# Patient Record
Sex: Male | Born: 1940 | Race: White | Hispanic: No | Marital: Married | Smoking: Never smoker
Health system: Southern US, Community
[De-identification: ages and names within clinical notes are randomized; demographics above are authoritative.]

## PROBLEM LIST (undated history)

## (undated) ENCOUNTER — Emergency Department (HOSPITAL_COMMUNITY): Admission: EM | Discharge status: Absent without leave | Payer: Self-pay

## (undated) DIAGNOSIS — E785 Hyperlipidemia, unspecified: Secondary | ICD-10-CM

## (undated) DIAGNOSIS — I10 Essential (primary) hypertension: Secondary | ICD-10-CM

## (undated) DIAGNOSIS — Z86010 Personal history of colonic polyps: Secondary | ICD-10-CM

## (undated) DIAGNOSIS — R972 Elevated prostate specific antigen [PSA]: Secondary | ICD-10-CM

## (undated) DIAGNOSIS — M1990 Unspecified osteoarthritis, unspecified site: Secondary | ICD-10-CM

## (undated) DIAGNOSIS — Z960 Presence of urogenital implants: Secondary | ICD-10-CM

## (undated) DIAGNOSIS — N400 Enlarged prostate without lower urinary tract symptoms: Secondary | ICD-10-CM

## (undated) DIAGNOSIS — I739 Peripheral vascular disease, unspecified: Secondary | ICD-10-CM

## (undated) DIAGNOSIS — Z860101 Personal history of adenomatous and serrated colon polyps: Secondary | ICD-10-CM

## (undated) DIAGNOSIS — Z85820 Personal history of malignant melanoma of skin: Secondary | ICD-10-CM

## (undated) DIAGNOSIS — R339 Retention of urine, unspecified: Secondary | ICD-10-CM

## (undated) DIAGNOSIS — Z978 Presence of other specified devices: Secondary | ICD-10-CM

## (undated) DIAGNOSIS — C801 Malignant (primary) neoplasm, unspecified: Secondary | ICD-10-CM

## (undated) HISTORY — PX: TONSILLECTOMY: SUR1361

---

## 1982-08-17 HISTORY — PX: ANAL FISSURE REPAIR: SHX2312

## 1999-06-19 ENCOUNTER — Other Ambulatory Visit: Admission: RE | Admit: 1999-06-19 | Discharge: 1999-06-19 | Payer: Self-pay | Admitting: Urology

## 2000-10-11 ENCOUNTER — Other Ambulatory Visit: Admission: RE | Admit: 2000-10-11 | Discharge: 2000-10-11 | Payer: Self-pay | Admitting: Urology

## 2000-10-11 ENCOUNTER — Encounter (INDEPENDENT_AMBULATORY_CARE_PROVIDER_SITE_OTHER): Payer: Self-pay

## 2004-09-10 ENCOUNTER — Ambulatory Visit (HOSPITAL_COMMUNITY): Admission: RE | Admit: 2004-09-10 | Discharge: 2004-09-10 | Payer: Self-pay | Admitting: *Deleted

## 2004-09-10 ENCOUNTER — Ambulatory Visit (HOSPITAL_BASED_OUTPATIENT_CLINIC_OR_DEPARTMENT_OTHER): Admission: RE | Admit: 2004-09-10 | Discharge: 2004-09-10 | Payer: Self-pay | Admitting: *Deleted

## 2004-09-10 ENCOUNTER — Encounter (INDEPENDENT_AMBULATORY_CARE_PROVIDER_SITE_OTHER): Payer: Self-pay | Admitting: Specialist

## 2004-09-10 HISTORY — PX: OTHER SURGICAL HISTORY: SHX169

## 2004-09-18 ENCOUNTER — Ambulatory Visit: Payer: Self-pay | Admitting: Oncology

## 2010-05-20 ENCOUNTER — Ambulatory Visit (HOSPITAL_BASED_OUTPATIENT_CLINIC_OR_DEPARTMENT_OTHER): Admission: RE | Admit: 2010-05-20 | Discharge: 2010-05-20 | Payer: Self-pay | Admitting: Orthopedic Surgery

## 2010-05-20 HISTORY — PX: SHOULDER ARTHROSCOPY W/ SUBACROMIAL DECOMPRESSION AND DISTAL CLAVICLE EXCISION: SHX2401

## 2010-10-30 LAB — BASIC METABOLIC PANEL WITH GFR
BUN: 15 mg/dL (ref 6–23)
CO2: 27 meq/L (ref 19–32)
Calcium: 9.4 mg/dL (ref 8.4–10.5)
Chloride: 105 meq/L (ref 96–112)
Creatinine, Ser: 1.23 mg/dL (ref 0.4–1.5)
GFR calc non Af Amer: 59 mL/min — ABNORMAL LOW
Glucose, Bld: 115 mg/dL — ABNORMAL HIGH (ref 70–99)
Potassium: 4 meq/L (ref 3.5–5.1)
Sodium: 141 meq/L (ref 135–145)

## 2011-01-02 NOTE — Op Note (Signed)
Zachary Wang, Zachary Wang               ACCOUNT NO.:  [id number]   MEDICAL RECORD NO.:  [id number]          PATIENT TYPE:  AMB   LOCATION:  DSC                          FACILITY:  [facility]   PHYSICIAN:  Vikki Ports, MDDATE OF BIRTH:  1941/05/21   DATE OF PROCEDURE:  09/10/2004  DATE OF DISCHARGE:                                 OPERATIVE REPORT   PREOPERATIVE DIAGNOSIS:  Malignant melanoma of the left forearm.   POSTOPERATIVE DIAGNOSIS:  Malignant melanoma of the left forearm.   PROCEDURE:  Wide excision of malignant melanoma of the left forearm and left  axillary sentinel lymph node biopsy.   ANESTHESIA:  General.   SURGEON:  Danna Hefty, M.D.   DESCRIPTION OF PROCEDURE:  The patient was taken to the operating room and  placed in the supine position.  After adequate anesthesia was induced using  laryngeal mask, the left axilla and arm were prepped and draped in the  normal sterile fashion.  Using 1 mL Olympus __________ blue dye was injected  dermally around the lesion in the left forearm.  The NeoProbe was then used  to localize an area of  high activity in the left axilla, incision was made  over this area, dissected down through subcutaneous fat until a blue hot  radioactive lymph node was identified, excised in its entirety.  Ex-vivo  counts were in the 500 range, background counts were 15.  Adequate  hemostasis was ensured and the skin was closed with subcuticular 3-0  Monocryl.   I then turned my attention to the left forearm where an elliptical incision  was made around the previous excision site, dissected down to subcutaneous  fat.  The lateral margin was marked and it was sent for pathologic  evaluation.  Adequate hemostasis was ensured and the wound was closed with  interrupted vertical mattress 3-0 nylon suture.  Sterile dressings were  applied, patient tolerated the procedure well and went to PACU in good  condition.      KRH/MEDQ  D:  09/10/2004  T:   09/10/2004  Job:  [id number]

## 2013-03-29 ENCOUNTER — Other Ambulatory Visit: Payer: Self-pay | Admitting: Gastroenterology

## 2013-05-02 ENCOUNTER — Ambulatory Visit (HOSPITAL_COMMUNITY)
Admission: RE | Admit: 2013-05-02 | Discharge: 2013-05-02 | Disposition: A | Discharge status: Home / Self Care | Payer: Medicare Other | Source: Ambulatory Visit | Attending: Gastroenterology | Admitting: Gastroenterology

## 2013-05-02 ENCOUNTER — Encounter (HOSPITAL_COMMUNITY): Payer: Self-pay | Admitting: *Deleted

## 2013-05-02 ENCOUNTER — Encounter (HOSPITAL_COMMUNITY)
Admission: RE | Disposition: A | Discharge status: Home / Self Care | Payer: Self-pay | Source: Ambulatory Visit | Attending: Gastroenterology

## 2013-05-02 DIAGNOSIS — D126 Benign neoplasm of colon, unspecified: Secondary | ICD-10-CM | POA: Insufficient documentation

## 2013-05-02 DIAGNOSIS — M1990 Unspecified osteoarthritis, unspecified site: Secondary | ICD-10-CM | POA: Insufficient documentation

## 2013-05-02 DIAGNOSIS — R972 Elevated prostate specific antigen [PSA]: Secondary | ICD-10-CM | POA: Insufficient documentation

## 2013-05-02 DIAGNOSIS — I10 Essential (primary) hypertension: Secondary | ICD-10-CM | POA: Insufficient documentation

## 2013-05-02 DIAGNOSIS — E7800 Pure hypercholesterolemia, unspecified: Secondary | ICD-10-CM | POA: Insufficient documentation

## 2013-05-02 HISTORY — DX: Peripheral vascular disease, unspecified: I73.9

## 2013-05-02 HISTORY — DX: Unspecified osteoarthritis, unspecified site: M19.90

## 2013-05-02 HISTORY — PX: COLONOSCOPY: SHX5424

## 2013-05-02 HISTORY — DX: Essential (primary) hypertension: I10

## 2013-05-02 SURGERY — COLONOSCOPY
Anesthesia: Moderate Sedation

## 2013-05-02 MED ORDER — MIDAZOLAM HCL 10 MG/2ML IJ SOLN
INTRAMUSCULAR | Status: AC
  Filled 2013-05-02: qty 2

## 2013-05-02 MED ORDER — FENTANYL CITRATE 0.05 MG/ML IJ SOLN
INTRAMUSCULAR | Status: DC | PRN
  Administered 2013-05-02: 50 ug via INTRAVENOUS
  Administered 2013-05-02: 25 ug via INTRAVENOUS

## 2013-05-02 MED ORDER — FENTANYL CITRATE 0.05 MG/ML IJ SOLN
INTRAMUSCULAR | Status: AC
  Filled 2013-05-02: qty 2

## 2013-05-02 MED ORDER — SODIUM CHLORIDE 0.9 % IV SOLN
INTRAVENOUS | Status: DC
  Administered 2013-05-02: 500 mL via INTRAVENOUS

## 2013-05-02 MED ORDER — MIDAZOLAM HCL 5 MG/5ML IJ SOLN
INTRAMUSCULAR | Status: DC | PRN
  Administered 2013-05-02: 2 mg via INTRAVENOUS
  Administered 2013-05-02 (×2): 2.5 mg via INTRAVENOUS

## 2013-05-02 NOTE — Op Note (Signed)
Problem: Hemoccult-positive stool with normal hemoglobin  Endoscopist: Danise Edge  Premedication: Fentanyl 75 mcg. Versed 7 mg.  Procedure: Diagnostic colonoscopy The patient was placed in the left lateral decubitus position. Anal inspection and digital rectal exam were normal. The Pentax pediatric colonoscope was introduced into the rectum and advanced to the cecum. A normal-appearing appendiceal orifice and ileocecal valve were identified. Inspection of the terminal ileum was normal. Colonic preparation for the exam today was good.  Rectum. Normal. Retroflex view of the distal rectum normal.  Sigmoid colon and descending colon. Normal.   Splenic flexure. Normal.  Transverse colon. Normal.  Hepatic flexure. Normal.  Ascending colon. Three sessile polyps ranging in size from 5 mm-10 mm were removed with the cold snare and submitted for pathological interpretation.  Cecum and ileocecal valve. Normal.  Terminal ileum. Normal.  Assessment:Three polyps were removed from the ascending colon and submitted for pathological interpretation. Otherwise normal diagnostic proctocolonoscopy to the cecum.  Recommendations: If colon polyps returned neoplastic pathologically, the patient should undergo a surveillance colonoscopy in 5 years. If the colon polyps returned non-neoplastic pathologically, the patient should undergo a screening colonoscopy in 10 years.

## 2013-05-02 NOTE — H&P (Signed)
  Problem: Hemoccult-positive stool associated with a normal hemoglobin  History: The patient is a 72 year old male born 19-Dec-1940. The patient underwent a normal screening colonoscopy in March 2008. He chronically takes aspirin 81 daily. He submitted stool for Hemoccult testing as part of his health maintenance physical exam. His stool returned Hemoccult-positive. His hemoglobin was normal. His sister was diagnosed with rectal cancer.  The patient is scheduled to undergo a diagnostic colonoscopy to evaluate Hemoccult-positive stool.  Medication allergies: None  Past medical history: Hypertension. Hypercholesterolemia. Osteoarthritis. Elevated PSA test. Anal fissure surgery. Left shoulder surgery.  Exam: The patient is alert and lying comfortably on the endoscopy stretcher. Abdomen is soft and nontender to palpation. Cardiac exam reveals a regular rhythm. Lungs are clear to auscultation.  Plan: Proceed with diagnostic colonoscopy to evaluate Hemoccult-positive stool.

## 2013-05-03 ENCOUNTER — Encounter (HOSPITAL_COMMUNITY): Payer: Self-pay | Admitting: Gastroenterology

## 2013-06-22 ENCOUNTER — Other Ambulatory Visit: Payer: Self-pay

## 2015-08-27 NOTE — ED Notes (Signed)
Pt had CT abd/pelvis today. Results showed distended thick walled, sac related bladder containing stones. Results are being faxed to ED.

## 2015-09-19 ENCOUNTER — Other Ambulatory Visit: Payer: Self-pay | Admitting: Urology

## 2015-09-20 ENCOUNTER — Encounter (HOSPITAL_BASED_OUTPATIENT_CLINIC_OR_DEPARTMENT_OTHER): Payer: Self-pay | Admitting: *Deleted

## 2015-09-20 NOTE — Progress Notes (Signed)
NPO AFTER MN.  ARRIVE AT 0600.  NEEDS ISTAT AND EKG.  WILL TAKE NORVASC AND COZAAR AM DOS W/ SIPS OF WATER. REVIEWED [facility] GUIDELINES, WILL BRING MEDS.

## 2015-09-29 NOTE — Anesthesia Preprocedure Evaluation (Addendum)
Anesthesia Evaluation  Patient identified by MRN, date of birth, ID band Patient awake    Reviewed: Allergy & Precautions, NPO status , Patient's Chart, lab work & pertinent test results  Airway Mallampati: III  TM Distance: >3 FB Neck ROM: Full    Dental  (+) Teeth Intact, Dental Advisory Given   Pulmonary neg pulmonary ROS,    breath sounds clear to auscultation       Cardiovascular hypertension, Pt. on medications + Peripheral Vascular Disease   Rhythm:Regular Rate:Normal     Neuro/Psych negative neurological ROS  negative psych ROS   GI/Hepatic negative GI ROS, Neg liver ROS,   Endo/Other  negative endocrine ROS  Renal/GU negative Renal ROS  negative genitourinary   Musculoskeletal  (+) Arthritis ,   Abdominal   Peds negative pediatric ROS (+)  Hematology negative hematology ROS (+)   Anesthesia Other Findings - HLD - BPH - Malignant Melanoma  Reproductive/Obstetrics negative OB ROS                            Lab Results  Component Value Date   HGB 13.1 05/20/2010   Lab Results  Component Value Date   CREATININE 1.23 05/15/2010   BUN 15 05/15/2010   NA 141 05/15/2010   K 4.0 05/15/2010   CL 105 05/15/2010   CO2 27 05/15/2010   No results found for: INR, PROTIME   Anesthesia Physical Anesthesia Plan  ASA: II  Anesthesia Plan: General   Post-op Pain Management:    Induction: Intravenous  Airway Management Planned: LMA  Additional Equipment:   Intra-op Plan:   Post-operative Plan: Extubation in OR  Informed Consent: I have reviewed the patients History and Physical, chart, labs and discussed the procedure including the risks, benefits and alternatives for the proposed anesthesia with the patient or authorized representative who has indicated his/her understanding and acceptance.   Dental advisory given  Plan Discussed with: CRNA  Anesthesia Plan Comments:  (Need EKG)        Anesthesia Quick Evaluation

## 2015-09-30 ENCOUNTER — Ambulatory Visit (HOSPITAL_BASED_OUTPATIENT_CLINIC_OR_DEPARTMENT_OTHER)
Admission: RE | Admit: 2015-09-30 | Discharge: 2015-10-01 | Disposition: A | Discharge status: Home / Self Care | Payer: Medicare Other | Source: Ambulatory Visit | Attending: Urology | Admitting: Urology

## 2015-09-30 ENCOUNTER — Encounter (HOSPITAL_BASED_OUTPATIENT_CLINIC_OR_DEPARTMENT_OTHER): Payer: Self-pay

## 2015-09-30 ENCOUNTER — Ambulatory Visit (HOSPITAL_BASED_OUTPATIENT_CLINIC_OR_DEPARTMENT_OTHER): Payer: Medicare Other | Admitting: Anesthesiology

## 2015-09-30 ENCOUNTER — Encounter (HOSPITAL_COMMUNITY)
Admission: RE | Disposition: A | Discharge status: Home / Self Care | Payer: Self-pay | Source: Ambulatory Visit | Attending: Urology

## 2015-09-30 ENCOUNTER — Other Ambulatory Visit: Payer: Self-pay

## 2015-09-30 DIAGNOSIS — N528 Other male erectile dysfunction: Secondary | ICD-10-CM | POA: Diagnosis not present

## 2015-09-30 DIAGNOSIS — R972 Elevated prostate specific antigen [PSA]: Secondary | ICD-10-CM | POA: Diagnosis not present

## 2015-09-30 DIAGNOSIS — I739 Peripheral vascular disease, unspecified: Secondary | ICD-10-CM | POA: Diagnosis not present

## 2015-09-30 DIAGNOSIS — M1990 Unspecified osteoarthritis, unspecified site: Secondary | ICD-10-CM | POA: Insufficient documentation

## 2015-09-30 DIAGNOSIS — N401 Enlarged prostate with lower urinary tract symptoms: Secondary | ICD-10-CM | POA: Diagnosis present

## 2015-09-30 DIAGNOSIS — Z85820 Personal history of malignant melanoma of skin: Secondary | ICD-10-CM | POA: Insufficient documentation

## 2015-09-30 DIAGNOSIS — N138 Other obstructive and reflux uropathy: Secondary | ICD-10-CM | POA: Insufficient documentation

## 2015-09-30 DIAGNOSIS — E291 Testicular hypofunction: Secondary | ICD-10-CM | POA: Diagnosis not present

## 2015-09-30 DIAGNOSIS — Z79899 Other long term (current) drug therapy: Secondary | ICD-10-CM | POA: Diagnosis not present

## 2015-09-30 DIAGNOSIS — E7800 Pure hypercholesterolemia, unspecified: Secondary | ICD-10-CM | POA: Diagnosis not present

## 2015-09-30 DIAGNOSIS — I10 Essential (primary) hypertension: Secondary | ICD-10-CM | POA: Diagnosis not present

## 2015-09-30 DIAGNOSIS — R338 Other retention of urine: Secondary | ICD-10-CM | POA: Diagnosis not present

## 2015-09-30 DIAGNOSIS — I452 Bifascicular block: Secondary | ICD-10-CM | POA: Insufficient documentation

## 2015-09-30 HISTORY — DX: Personal history of colonic polyps: Z86.010

## 2015-09-30 HISTORY — DX: Personal history of adenomatous and serrated colon polyps: Z86.0101

## 2015-09-30 HISTORY — DX: Benign prostatic hyperplasia without lower urinary tract symptoms: N40.0

## 2015-09-30 HISTORY — DX: Elevated prostate specific antigen (PSA): R97.20

## 2015-09-30 HISTORY — DX: Retention of urine, unspecified: R33.9

## 2015-09-30 HISTORY — DX: Personal history of malignant melanoma of skin: Z85.820

## 2015-09-30 HISTORY — DX: Presence of other specified devices: Z97.8

## 2015-09-30 HISTORY — PX: INSERTION OF SUPRAPUBIC CATHETER: SHX5870

## 2015-09-30 HISTORY — DX: Presence of urogenital implants: Z96.0

## 2015-09-30 HISTORY — DX: Hyperlipidemia, unspecified: E78.5

## 2015-09-30 HISTORY — PX: TRANSURETHRAL RESECTION OF PROSTATE: SHX73

## 2015-09-30 LAB — POCT I-STAT 4, (NA,K, GLUC, HGB,HCT)
Glucose, Bld: 107 mg/dL — ABNORMAL HIGH (ref 65–99)
HCT: 38 % — ABNORMAL LOW (ref 39.0–52.0)
Hemoglobin: 12.9 g/dL — ABNORMAL LOW (ref 13.0–17.0)
Potassium: 4 mmol/L (ref 3.5–5.1)
Sodium: 140 mmol/L (ref 135–145)

## 2015-09-30 SURGERY — TRANSURETHRAL RESECTION OF THE PROSTATE WITH GYRUS INSTRUMENTS
Anesthesia: General

## 2015-09-30 MED ORDER — EPHEDRINE SULFATE 50 MG/ML IJ SOLN
INTRAMUSCULAR | Status: DC | PRN
  Administered 2015-09-30 (×2): 10 mg via INTRAVENOUS

## 2015-09-30 MED ORDER — CIPROFLOXACIN IN D5W 400 MG/200ML IV SOLN
400.0000 mg | Freq: Two times a day (BID) | INTRAVENOUS | Status: DC
  Administered 2015-09-30 – 2015-10-01 (×2): 400 mg via INTRAVENOUS
  Filled 2015-09-30 (×2): qty 200

## 2015-09-30 MED ORDER — MIDAZOLAM HCL 2 MG/2ML IJ SOLN
INTRAMUSCULAR | Status: AC
  Filled 2015-09-30: qty 2

## 2015-09-30 MED ORDER — EPHEDRINE SULFATE 50 MG/ML IJ SOLN
INTRAMUSCULAR | Status: AC
  Filled 2015-09-30: qty 1

## 2015-09-30 MED ORDER — FENTANYL CITRATE (PF) 100 MCG/2ML IJ SOLN
INTRAMUSCULAR | Status: AC
  Filled 2015-09-30: qty 2

## 2015-09-30 MED ORDER — PHENAZOPYRIDINE HCL 200 MG PO TABS: 200.0000 mg | ORAL_TABLET | Freq: Three times a day (TID) | ORAL | Status: DC | PRN

## 2015-09-30 MED ORDER — HYDROMORPHONE HCL 1 MG/ML IJ SOLN
0.5000 mg | INTRAMUSCULAR | Status: DC | PRN
  Filled 2015-09-30: qty 1

## 2015-09-30 MED ORDER — DEXAMETHASONE SODIUM PHOSPHATE 4 MG/ML IJ SOLN
INTRAMUSCULAR | Status: DC | PRN
  Administered 2015-09-30: 10 mg via INTRAVENOUS

## 2015-09-30 MED ORDER — CIPROFLOXACIN IN D5W 400 MG/200ML IV SOLN
400.0000 mg | INTRAVENOUS | Status: AC
  Administered 2015-09-30: 400 mg via INTRAVENOUS
  Filled 2015-09-30: qty 200

## 2015-09-30 MED ORDER — BELLADONNA ALKALOIDS-OPIUM 16.2-60 MG RE SUPP
RECTAL | Status: AC
Start: 2015-09-30 — End: 2015-09-30
  Filled 2015-09-30: qty 1

## 2015-09-30 MED ORDER — FENTANYL CITRATE (PF) 100 MCG/2ML IJ SOLN
25.0000 ug | INTRAMUSCULAR | Status: DC | PRN
  Administered 2015-09-30: 25 ug via INTRAVENOUS
  Administered 2015-09-30 (×3): 50 ug via INTRAVENOUS
  Filled 2015-09-30: qty 1

## 2015-09-30 MED ORDER — FENTANYL CITRATE (PF) 100 MCG/2ML IJ SOLN
INTRAMUSCULAR | Status: DC | PRN
  Administered 2015-09-30: 25 ug via INTRAVENOUS
  Administered 2015-09-30: 50 ug via INTRAVENOUS
  Administered 2015-09-30 (×2): 25 ug via INTRAVENOUS
  Administered 2015-09-30: 50 ug via INTRAVENOUS
  Administered 2015-09-30: 25 ug via INTRAVENOUS

## 2015-09-30 MED ORDER — ONDANSETRON HCL 4 MG/2ML IJ SOLN
4.0000 mg | INTRAMUSCULAR | Status: DC | PRN
  Administered 2015-10-01: 4 mg via INTRAVENOUS
  Filled 2015-09-30 (×2): qty 2

## 2015-09-30 MED ORDER — CIPROFLOXACIN HCL 500 MG PO TABS: 500.0000 mg | ORAL_TABLET | Freq: Two times a day (BID) | ORAL | Status: DC

## 2015-09-30 MED ORDER — OXYCODONE HCL 5 MG PO TABS
5.0000 mg | ORAL_TABLET | ORAL | Status: DC | PRN
  Administered 2015-09-30 – 2015-10-01 (×6): 5 mg via ORAL
  Filled 2015-09-30 (×6): qty 1

## 2015-09-30 MED ORDER — BELLADONNA ALKALOIDS-OPIUM 16.2-60 MG RE SUPP
RECTAL | Status: AC
  Filled 2015-09-30: qty 1

## 2015-09-30 MED ORDER — PROPOFOL 10 MG/ML IV BOLUS
INTRAVENOUS | Status: AC
  Filled 2015-09-30: qty 20

## 2015-09-30 MED ORDER — BELLADONNA ALKALOIDS-OPIUM 16.2-60 MG RE SUPP
1.0000 | Freq: Four times a day (QID) | RECTAL | Status: DC | PRN
  Administered 2015-09-30 (×2): 1 via RECTAL
  Filled 2015-09-30 (×2): qty 1

## 2015-09-30 MED ORDER — HYDROCODONE-ACETAMINOPHEN 10-325 MG PO TABS: 1.0000 | ORAL_TABLET | ORAL | Status: DC | PRN

## 2015-09-30 MED ORDER — BUPIVACAINE HCL (PF) 0.25 % IJ SOLN
INTRAMUSCULAR | Status: DC | PRN
  Administered 2015-09-30: 20 mL

## 2015-09-30 MED ORDER — ACETAMINOPHEN 325 MG PO TABS
650.0000 mg | ORAL_TABLET | ORAL | Status: DC | PRN
  Filled 2015-09-30: qty 2

## 2015-09-30 MED ORDER — LIDOCAINE HCL (CARDIAC) 20 MG/ML IV SOLN
INTRAVENOUS | Status: DC | PRN
  Administered 2015-09-30: 60 mg via INTRAVENOUS

## 2015-09-30 MED ORDER — LACTATED RINGERS IV SOLN
INTRAVENOUS | Status: DC
Start: 2015-09-30 — End: 2015-09-30
  Administered 2015-09-30: 11:00:00 via INTRAVENOUS
  Filled 2015-09-30: qty 1000

## 2015-09-30 MED ORDER — WHITE PETROLATUM GEL
Status: AC
  Filled 2015-09-30: qty 5

## 2015-09-30 MED ORDER — LACTATED RINGERS IV SOLN
INTRAVENOUS | Status: DC
Start: 2015-09-30 — End: 2015-09-30
  Administered 2015-09-30: 07:00:00 via INTRAVENOUS
  Filled 2015-09-30: qty 1000

## 2015-09-30 MED ORDER — ONDANSETRON HCL 4 MG/2ML IJ SOLN
INTRAMUSCULAR | Status: AC
Start: 2015-09-30 — End: 2015-09-30
  Filled 2015-09-30: qty 2

## 2015-09-30 MED ORDER — CIPROFLOXACIN IN D5W 400 MG/200ML IV SOLN
400.0000 mg | Freq: Two times a day (BID) | INTRAVENOUS | Status: DC
  Filled 2015-09-30: qty 200

## 2015-09-30 MED ORDER — DEXAMETHASONE SODIUM PHOSPHATE 10 MG/ML IJ SOLN
INTRAMUSCULAR | Status: AC
  Filled 2015-09-30: qty 1

## 2015-09-30 MED ORDER — PROMETHAZINE HCL 25 MG/ML IJ SOLN
6.2500 mg | INTRAMUSCULAR | Status: DC | PRN
Start: 2015-09-30 — End: 2015-09-30
  Filled 2015-09-30: qty 1

## 2015-09-30 MED ORDER — MIDAZOLAM HCL 5 MG/5ML IJ SOLN
INTRAMUSCULAR | Status: DC | PRN
  Administered 2015-09-30: 2 mg via INTRAVENOUS

## 2015-09-30 MED ORDER — LIDOCAINE HCL (CARDIAC) 20 MG/ML IV SOLN
INTRAVENOUS | Status: AC
  Filled 2015-09-30: qty 5

## 2015-09-30 MED ORDER — OXYCODONE HCL 5 MG PO TABS
ORAL_TABLET | ORAL | Status: AC
  Filled 2015-09-30: qty 1

## 2015-09-30 MED ORDER — SODIUM CHLORIDE 0.9 % IV SOLN
INTRAVENOUS | Status: DC
  Administered 2015-09-30 – 2015-10-01 (×2): via INTRAVENOUS
  Filled 2015-09-30: qty 1000

## 2015-09-30 MED ORDER — BACITRACIN-NEOMYCIN-POLYMYXIN 400-5-5000 EX OINT
1.0000 "application " | TOPICAL_OINTMENT | Freq: Three times a day (TID) | CUTANEOUS | Status: DC | PRN
  Filled 2015-09-30: qty 1

## 2015-09-30 MED ORDER — CIPROFLOXACIN IN D5W 400 MG/200ML IV SOLN
INTRAVENOUS | Status: AC
  Filled 2015-09-30: qty 200

## 2015-09-30 MED ORDER — ONDANSETRON HCL 4 MG/2ML IJ SOLN
INTRAMUSCULAR | Status: DC | PRN
  Administered 2015-09-30: 4 mg via INTRAVENOUS

## 2015-09-30 MED ORDER — PROPOFOL 10 MG/ML IV BOLUS
INTRAVENOUS | Status: DC | PRN
  Administered 2015-09-30: 170 mg via INTRAVENOUS

## 2015-09-30 SURGICAL SUPPLY — 51 items
BAG DRAIN URO-CYSTO SKYTR STRL (DRAIN) ×2 IMPLANT
BAG URINE DRAINAGE (UROLOGICAL SUPPLIES) ×2 IMPLANT
BAG URINE LEG 19OZ MD ST LTX (BAG) ×2 IMPLANT
BLADE CLIPPER SURG (BLADE) IMPLANT
CANISTER SUCT LVC 12 LTR MEDI- (MISCELLANEOUS) ×2 IMPLANT
CATH BONANNO SUPRAPUBIC 14G (CATHETERS) IMPLANT
CATH FOLEY 2WAY SLVR  5CC 16FR (CATHETERS) ×1
CATH FOLEY 2WAY SLVR  5CC 18FR (CATHETERS)
CATH FOLEY 2WAY SLVR  5CC 20FR (CATHETERS) ×1
CATH FOLEY 2WAY SLVR 5CC 16FR (CATHETERS) ×1 IMPLANT
CATH FOLEY 2WAY SLVR 5CC 18FR (CATHETERS) IMPLANT
CATH FOLEY 2WAY SLVR 5CC 20FR (CATHETERS) ×1 IMPLANT
CATH FOLEY 3WAY 30CC 24FR (CATHETERS) ×1
CATH FOLEY INTRO SUPRA 16F (CATHETERS) ×2 IMPLANT
CATH HEMA 3WAY 30CC 24FR COUDE (CATHETERS) IMPLANT
CATH HEMA 3WAY 30CC 24FR RND (CATHETERS) IMPLANT
CATH URTH STD 24FR FL 3W 2 (CATHETERS) ×1 IMPLANT
CLOTH BEACON ORANGE TIMEOUT ST (SAFETY) ×2 IMPLANT
DRSG TEGADERM 4X4.75 (GAUZE/BANDAGES/DRESSINGS) ×2 IMPLANT
ELECT BIVAP BIPO 22/24 DONUT (ELECTROSURGICAL) ×2
ELECT BUTTON BIOP 24F 90D PLAS (MISCELLANEOUS) IMPLANT
ELECT REM PT RETURN 9FT ADLT (ELECTROSURGICAL) ×2
ELECTRD BIVAP BIPO 22/24 DONUT (ELECTROSURGICAL) ×1 IMPLANT
ELECTRODE REM PT RTRN 9FT ADLT (ELECTROSURGICAL) ×1 IMPLANT
EVACUATOR MICROVAS BLADDER (UROLOGICAL SUPPLIES) IMPLANT
GLOVE BIO SURGEON STRL SZ8 (GLOVE) ×2 IMPLANT
GOWN STRL REUS W/ TWL LRG LVL3 (GOWN DISPOSABLE) ×1 IMPLANT
GOWN STRL REUS W/ TWL XL LVL3 (GOWN DISPOSABLE) ×1 IMPLANT
GOWN STRL REUS W/TWL LRG LVL3 (GOWN DISPOSABLE) ×1
GOWN STRL REUS W/TWL XL LVL3 (GOWN DISPOSABLE) ×1
HOLDER FOLEY CATH W/STRAP (MISCELLANEOUS) IMPLANT
IV NS 1000ML (IV SOLUTION) ×16
IV NS 1000ML BAXH (IV SOLUTION) ×16 IMPLANT
IV NS IRRIG 3000ML ARTHROMATIC (IV SOLUTION) ×14 IMPLANT
KIT ROOM TURNOVER WOR (KITS) ×2 IMPLANT
KIT SUPRAPUBIC CATH (MISCELLANEOUS) IMPLANT
LOOP CUT BIPOLAR 24F LRG (ELECTROSURGICAL) ×2 IMPLANT
MANIFOLD NEPTUNE II (INSTRUMENTS) IMPLANT
NEEDLE HYPO 25X1 1.5 SAFETY (NEEDLE) ×2 IMPLANT
PACK CYSTO (CUSTOM PROCEDURE TRAY) ×2 IMPLANT
PENCIL BUTTON HOLSTER BLD 10FT (ELECTRODE) IMPLANT
PLUG CATH AND CAP STER (CATHETERS) IMPLANT
SET ASPIRATION TUBING (TUBING) ×2 IMPLANT
SPONGE GAUZE 4X4 12PLY STER LF (GAUZE/BANDAGES/DRESSINGS) ×2 IMPLANT
SUT ETHILON 2 0 PS N (SUTURE) ×2 IMPLANT
SUT SILK 0 TIES 10X30 (SUTURE) ×2 IMPLANT
SUT SILK 2 0 FS (SUTURE) IMPLANT
SYR 30ML LL (SYRINGE) IMPLANT
SYRINGE IRR TOOMEY STRL 70CC (SYRINGE) IMPLANT
TUBE CONNECTING 12X1/4 (SUCTIONS) IMPLANT
WATER STERILE IRR 3000ML UROMA (IV SOLUTION) ×2 IMPLANT

## 2015-09-30 NOTE — Op Note (Signed)
PATIENT:  Zachary Wang.  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 is a 75 year old male who developed urinary retention and failed multiple voiding trials despite maximum medical management. Urodynamics revealed he did have a detrusor contraction although it was felt that he was at a slightly increased risk for not voiding spontaneously despite TURP and therefore we discussed placement of a suprapubic tube. He understands and has elected to proceed.  ANESTHESIA:  General  EBL:  Minimal  DRAINS:  55 French Foley catheter as a suprapubic tube and a 25 French Foley catheter per urethra.   SPECIMEN:  Prostate chips to pathology  After informed consent the patient was brought to the major OR and placed on the table. He was administered general anesthesia and then moved to the dorsal lithotomy position. His genitalia was sterilely prepped and draped and an official timeout was then performed.  Initially the 28 French resectoscope sheath with the visual obturator was passed into the bladder and the obturator removed. I then inserted the resectoscope element with 30 lens and  performed a systematic inspection of the bladder. I noted the bladder 3+ trabeculation but was free of any tumor stones or inflammatory lesions. The ureteral orifices were noted to be patulous with the left orifice quite dilated and open suggesting reflux. Withdrawing the scope into the prostatic urethra I noted obstructing bilobar hypertrophy with elongation of the prostatic urethra and a median lobe component.  Resection was then begun I. first resected the median lobe in the midline down to the level of the bladder neck. I then began resecting the left lobe of the prostate by resecting first from the level of the bladder neck back to the level of the Veru at the 5:00 position and  then progressed in a counterclockwise direction resecting all of the adenomatous tissue of the left lobe down to the surgical capsule. Bleeding points were cauterized as they were encountered. I then turned my attention to the right lobe of the prostate and it was resected in an identical fashion. Tissue in the area of the apex was then resected circumferentially with care being taken to maintain the resection proximal to the Veru at all times. The prostatic chips were then flushed into the bladder and the Microvasive evacuator was then used to evacuate all chips from the bladder. Reinspection of the bladder revealed the mucosa to be intact, the ureteral orifices intact as well and well away from the bladder neck and area of resection. There were no prostatic chips remaining within the bladder. The prostatic capsule was intact throughout with no perforation and there was no active bleeding noted at the end of the procedure.  I then proceeded with suprapubic tube placement. Patient was placed in Trendelenburg position and his bladder was filled to capacity. I was able to identify the dome of the bladder and approximately 2 fingerbreadths above the symphysis pubis I was able to palpate and see that this location was at the dome of the bladder. I therefore made an incision at this location and passed the trocar suprapubic tubing introducer through the skin incision and could see where it was indenting the dome of the bladder and passed this on into the bladder removing the trocar and inserting a 16 French Foley catheter through the introducer and inflating the balloon. The introducer was removed. I then injected quarter percent Marcaine  in the incision and secured the suprapubic tube with a figure-of-eight 2-0 nylon suture which was attached to the catheter. The irrigation fluid was then connected to the suprapubic tube.  The resectoscope was therefore removed after reinspection of the prostatic urethra revealed no  bleeding and the 22 French Foley catheter was then inserted and balloon was filled to 30 cc. This was placed on mild traction and the bladder was irrigated with the irrigant returning clear. The catheter was then hooked to closed system drainage and continuous irrigation and the patient was awakened and taken to recovery room in stable and satisfactory condition. He tolerated the procedure well and there were no intraoperative complications.  PLAN OF CARE: Observation overnight with anticipated discharge in the morning.  PATIENT DISPOSITION:  PACU - Hemodynamically stable.

## 2015-09-30 NOTE — Anesthesia Postprocedure Evaluation (Signed)
Anesthesia Post Note  Patient: Zachary Wang.  Procedure(s) Performed: Procedure(s) (LRB): TURP (TRANSURETHRAL RESECTION OF PROSTATE WITH GYRUS (N/A) INSERTION OF SUPRAPUBIC CATHETER (N/A)  Patient location during evaluation: PACU Anesthesia Type: General Level of consciousness: awake and alert Pain management: pain level controlled Vital Signs Assessment: post-procedure vital signs reviewed and stable Respiratory status: spontaneous breathing, nonlabored ventilation, respiratory function stable and patient connected to nasal cannula oxygen Cardiovascular status: blood pressure returned to baseline and stable Postop Assessment: no signs of nausea or vomiting Anesthetic complications: no    Last Vitals:  Filed Vitals:   09/30/15 1100 09/30/15 1115  BP: 132/68 125/65  Pulse: 50 50  Temp:    Resp: 13 13    Last Pain:  Filed Vitals:   09/30/15 1125  PainSc: [name]

## 2015-09-30 NOTE — Anesthesia Procedure Notes (Signed)
Procedure Name: LMA Insertion Date/Time: 09/30/2015 7:29 AM Performed by: Denna Haggard D Pre-anesthesia Checklist: Patient identified, Emergency Drugs available, Suction available and Patient being monitored Patient Re-evaluated:Patient Re-evaluated prior to inductionOxygen Delivery Method: Circle System Utilized Preoxygenation: Pre-oxygenation with 100% oxygen Intubation Type: IV induction Ventilation: Mask ventilation without difficulty LMA: LMA inserted LMA Size: 4.0 Number of attempts: 1 Airway Equipment and Method: Bite block Placement Confirmation: positive ETCO2 Tube secured with: Tape Dental Injury: Teeth and Oropharynx as per pre-operative assessment

## 2015-09-30 NOTE — Progress Notes (Signed)
Patient ID: Pilot Engles., male   DOB: 1941-03-17, 75 y.o.   MRN: [id number] Night of surgery note  Subjective: The patient is doing well.  No complaints.  Objective: Vital signs in last 24 hours: Temp:  [97.4 F (36.3 C)-98.6 F (37 C)] 98.1 F (36.7 C) (02/13 1528) Pulse Rate:  [35-64] 59 (02/13 1528) Resp:  [12-19] 16 (02/13 1528) BP: (117-158)/(57-76) 155/67 mmHg (02/13 1528) SpO2:  [96 %-100 %] 98 % (02/13 1528)  Intake/Output this shift: Total I/O In: 5340 [P.O.:340; I.V.:1000; Other:4000] Out: 2700 [Urine:2700]  Physical Exam:  General: Alert and oriented. Abdomen: Soft, Nondistended. Incision: Clean with dry, intact dressing. Foley catheter draining on CBI, slightly pink, no clots.  Lab Results:  Recent Labs  09/30/15 0656  HGB 12.9*  HCT 38.0*    Assessment/Plan: 1) Continue to monitor 2) Per orders   Ellisa Devivo C. Karsten Ro, [place] C 09/30/2015, 3:43 PM

## 2015-09-30 NOTE — Transfer of Care (Signed)
Immediate Anesthesia Transfer of Care Note  Patient: Zachary Wang.  Procedure(s) Performed: Procedure(s) (LRB): TURP (TRANSURETHRAL RESECTION OF PROSTATE WITH GYRUS (N/A) INSERTION OF SUPRAPUBIC CATHETER (N/A)  Patient Location: PACU  Anesthesia Type: General  Level of Consciousness: awake, oriented, sedated and patient cooperative  Airway & Oxygen Therapy: Patient Spontanous Breathing and Patient connected to face mask oxygen  Post-op Assessment: Report given to PACU RN and Post -op Vital signs reviewed and stable  Post vital signs: Reviewed and stable  Complications: No apparent anesthesia complications

## 2015-09-30 NOTE — Discharge Instructions (Signed)
Post transurethral resection of the prostate (TURP) instructions ° °Your recent prostate surgery requires very special post hospital care. Despite the fact that no skin incisions were used the area around the prostate incision is quite raw and is covered with a scab to promote healing and prevent bleeding. Certain cautions are needed to assure that the scab is not disturbed of the next 2-3 weeks while the healing proceeds. ° °Because the raw surface in your prostate and the irritating effects of urine you may expect frequency of urination and/or urgency (a stronger desire to urinate) and perhaps even getting up at night more often. This will usually resolve or improve slowly over the healing period. You may see some blood in your urine over the first 6 weeks. Do not be alarmed, even if the urine was clear for a while. Get off your feet and drink lots of fluids until clearing occurs. If you start to pass clots or don't improve call us. ° °Catheter: (If you are discharged with a catheter.) °1. Keep your catheter secured to your leg at all times with tape or the supplied strap. °2. You may experience leakage of urine around your catheter- as long as the  °catheter continues to drain, this is normal.  If your catheter stops draining  °go to the ER. °3. You may also have blood in your urine, even after it has been clear for  °several days; you may even pass some small blood clots or other material.  This  °is normal as well.  If this happens, sit down and drink plenty of water to help  °make urine to flush out your bladder.  If the blood in your urine becomes worse  °after doing this, contact our office or return to the ER. °4. You may use the leg bag (small bag) during the day, but use the large bag at  °night. ° °Diet: ° °You may return to your normal diet immediately. Because of the raw surface of your bladder, alcohol, spicy foods, foods high in acid and drinks with caffeine may cause irritation or frequency and  should be used in moderation. To keep your urine flowing freely and avoid constipation, drink plenty of fluids during the day (8-10 glasses). Tip: Avoid cranberry juice because it is very acidic. ° °Activity: ° °Your physical activity doesn't need to be restricted. However, if you are very active, you may see some blood in the urine. We suggest that you reduce your activity under the circumstances until the bleeding has stopped. ° °Bowels: ° °It is important to keep your bowels regular during the postoperative period. Straining with bowel movements can cause bleeding. A bowel movement every other day is reasonable. Use a mild laxative if needed, such as milk of magnesia 2-3 tablespoons, or 2 Dulcolax tablets. Call if you continue to have problems. If you had been taking narcotics for pain, before, during or after your surgery, you may be constipated. Take a laxative if necessary. ° °Medication: ° °You should resume your pre-surgery medications unless told not to. DO NOT RESUME YOUR ASPIRIN, WARFARIN, OR OTHER BLOOD THINNER FOR 1 WEEK. In addition you may be given an antibiotic to prevent or treat infection. Antibiotics are not always necessary. All medication should be taken as prescribed until the bottles are finished unless you are having an unusual reaction to one of the drugs. ° ° ° ° °Problems you should report to us: ° °a. Fever greater than 101°F. °b. Heavy bleeding, or clots (see   notes above about blood in urine). °c. Inability to urinate. °d. Drug reactions (hives, rash, nausea, vomiting, diarrhea). °e. Severe burning or pain with urination that is not improving. ° ° °Suprapubic Catheter Home Guide °A suprapubic catheter is a rubber tube with a tiny balloon on the end. It is used to drain urine from the bladder. This catheter is put in your bladder through a small opening in the lower center part of your abdomen. Suprapubic refers to the area right above your pubic bone. °The balloon on the end of the  catheter is filled with germ-free (sterile) water. This keeps the catheter from slipping out. When the catheter is in place, your urine will drain into a collection bag. The bag can be put beside your bed at night or attached to your leg during the day. °HOW TO CARE FOR YOUR CATHETER  °Cleaning your skin °· Clean the skin around the catheter opening every day. °¨ Wash your hands with soap and water. °¨ Clean the skin around the opening with a clean washcloth and soapy water. Do not pull on the tube. °¨ Pat the area dry with a clean towel. °· Your caregiver may want you to put a bandage (dressing) over the site. Do not use ointment on this area unless your caregiver tells you to. °Cleaning the catheter °· Ask your caregiver if you need to clean the catheter and how often. °· Use only soap and water. °· There may be crusts on the catheter. Put hydrogen peroxide on a cotton ball or gauze pad to remove any crust.  °Emptying the collection bag °· You may have a large drainage bag to use at night and a smaller one for daytime. Empty the large bag every 8 hours. Empty the small bag when it is about  full. °· Keep the drainage bag below the level of the catheter. This keeps urine from flowing backwards. °· Hold the bag over the toilet or another container. Release the valve (spigot) at the bottom of the bag. Do not touch the opening of the spigot. Do not let the opening touch the toilet or container. °· Close the spigot tightly when the bag is empty. °Cleaning the collection bag °· Clean the bag every few days. °¨ First, wash your hands. °¨ Disconnect the tubing from the catheter. Replace the used bag with a new bag. Then you can clean the used one. °¨ Empty the used bag completely. Rinse it out with warm water and soap or fill the bag with water and add 1 teaspoon of vinegar. Let it sit for about 30 minutes. Then drain. °· The bag should be completely dry before storing it. Put it inside a plastic bag to keep it  clean. °Checking everything °· Always make sure there are no kinks in the catheter or tubing. °· Always make sure there are no leaks in the catheter, tubing, or collection bag. °HOW TO CHANGE YOUR CATHETER °Sometimes, a caregiver will change your suprapubic catheter. Other times, you may need to change it yourself. This may be the case if you need to wear a catheter for a long time. Usually, they need to be changed every 4 to 6 weeks. Ask your caregiver how often yours should be changed. °Your caregiver will help you order the following supplies for home delivery: °· Sterile gloves. °· Catheters. °· Syringes. °· Sterile water. °· Sterile cleaning solution. °· Lubricant. °· Drainage bags. °Changing your catheter °· Drink plenty of fluids before changing the catheter. °·   Wash your hands with soap and water. °· Lie on your back and put on sterile gloves. °· Clean the skin around the catheter opening. Use the sterile cleaning solution. °· Use a syringe to get the water out of the balloon from the old catheter. °· Slowly remove the catheter. °· Take off the first pair of gloves, and put on a new pair. Then put lubricant on the tip of the new catheter. Put the new catheter through the opening. °· Wait for some urine to start flowing. Then, use the other syringe to fill the balloon with sterile water. °· Attach the catheter to your drainage bag. Make sure the connection is tight. °Important warnings °· The catheter should come out easily. If it seems stuck, do not pull it. °· Call your caregiver right away if you have any trouble while changing the catheter. °· When the old catheter is removed, the new one should be put in right away. This is because the opening will close quickly. If you have a problem, go to an emergency clinic right away. °RISKS AND COMPLICATIONS °· Urine flow can become blocked. This can happen if the catheter or tubes are not working right. A blood clot can also block urine flow. °· The catheter might  irritate tissue in your body. This can cause bleeding. °· The skin near the opening for the catheter may become irritated or infected. °· Bacteria may get into your bladder. This can cause a urinary tract infection. °HOME CARE INSTRUCTIONS °· Take all medicines prescribed by your caregiver. Follow the directions carefully. °· Drink 8 glasses of water every day. This produces good urine flow. °· Check the skin around your catheter a few times every day. Watch for redness and swelling. Look for any fluids coming out of the opening. °· Do not use powder or cream around the catheter opening. °· Do not take tub baths or use pools or hot tubs. °· Keep all follow-up appointments. °SEEK MEDICAL CARE IF: °· You leak urine. °· Your skin around the catheter becomes red or sore. °· Your urine flow slows down. °· Your urine gets cloudy or smelly. °SEEK IMMEDIATE MEDICAL CARE IF:  °· You have chills, nausea, or back pain. °· You have trouble changing your catheter. °· Your catheter comes out. °· You have blood in your urine. °· You have no urine flow for 1 hour. °· You have a fever. °  °This information is not intended to replace advice given to you by your health care provider. Make sure you discuss any questions you have with your health care provider. °  °Document Released: 04/21/2011 Document Revised: 12/18/2014 Document Reviewed: 04/21/2011 °[employer] Interactive Patient Education ©2016 [employer] ° °

## 2015-09-30 NOTE — H&P (Signed)
Zachary Wang is a 75 year old male with BPH and urinary retention.   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 He had been managed with an alpha-blocker and eventually a 5 alpha reductase inhibitor due to his large prostate size.  He then developed small frequent voidings. A CT scan on 08/27/15 revealed normal-appearing kidneys with bilateral hydronephrosis down to a distended bladder with a diverticulum involving the right wall and a minute calcification on the floor of the bladder. Cr. - 1.6.    Left varicocele: He underwent left varicocelectomy by Dr. Serita Butcher in the distant past.    Organic erectile dysfunction: This was managed initially with Viagra and then a trial of Levitra.    LUTS: He developed significant urinary frequency and underwent UDS in 6/07 which revealed a small capacity unstable bladder with slight outlet obstruction and a PVR of 42 cc. He was tried on multiple anticholinergics (Ditropan, Sanctura and Detrol LA) and eventually was referred to physical therapy.    Left spermatocele and right testicular atrophy: These were noted on scrotal ultrasound in 8/02.    Hypogonadism: He was found to have a low serum testosterone but testosterone replacement therapy was not undertaken because of his history of elevated PSA.    Interval history: He underwent urodynamics to evaluate his bladder further due to his urinary retention.          Past Medical History Problems  1. History of Arthritis 2. History of Elevated prostate specific antigen (PSA) (R97.20) 3. History of hypercholesterolemia (Z86.39) 4. History of hypertension (Z86.79) 5. History of urinary frequency (Z87.898) 6. History of Malignant Melanoma Of The Skin  Surgical History Problems  1. History of  Fistula-in-ano Repair 2. History of Forearm Excision 3. History of Needle Biopsy Of Prostate 4. History of Needle Biopsy Of Prostate 5. History of Shoulder Surgery Left 6. History of Surgery Spermatic Cord Excision Of Varicocele Left  Current Meds 1. AmLODIPine Besylate 5 MG Oral Tablet;  Therapy: 29Dec2010 to Recorded 2. HydroCHLOROthiazide 25 MG Oral Tablet;  Therapy: (Recorded:30Nov2010) to Recorded 3. Lipitor 20 MG Oral Tablet;  Therapy: (Recorded:31Jan2017) to Recorded 4. Losartan Potassium 100 MG Oral Tablet;  Therapy: 22Nov2010 to Recorded 5. Rapaflo 8 MG Oral Capsule; 1 po q day;  Therapy: [id number] to (Last Rx:14Dec2016) Ordered  Allergies Medication  1. No Known Drug Allergies Non-Medication  2. Mold  Family History Problems  1. Family history of Alzheimer Disease : Mother 2. Family history of Death In The Family Father 3. Family history of Death In The Family Mother 4. Family history of Emphysema : Father 5. Family history of Family Health Status Number Of Children : Father   3 daughters 41. Family history of Nephrolithiasis  Social History Problems  1. Alcohol Use (History)   occasional wine or beer 2. Caffeine Use   3-4 3. Marital History - Currently Married 4. Never A Smoker 5. Occupation:   real estate 6. Denied: History of Tobacco Use   Review of Systems Genitourinary, constitutional, skin, eye, otolaryngeal, hematologic/lymphatic, cardiovascular, pulmonary, endocrine, musculoskeletal, gastrointestinal, neurological and psychiatric system(s) were reviewed and pertinent findings if present are noted and are otherwise negative.  Genitourinary: urinary frequency, nocturia, incontinence, weak urinary stream, post-void dribbling, pelvic pain and erectile dysfunction.  Gastrointestinal: nausea. Musculoskeletal: back  pain and joint pain.     Physical Exam Constitutional: Well nourished and well developed . No acute distress.   ENT:. The ears and  nose are normal in appearance.   Neck: The appearance of the neck is normal and no neck mass is present.   Pulmonary: No respiratory distress and normal respiratory rhythm and effort.   Cardiovascular: Heart rate and rhythm are normal . No peripheral edema.   Abdomen: The abdomen is obese. The abdomen is soft and nontender. No masses are palpated. No CVA tenderness. No hernias are palpable. No hepatosplenomegaly noted.   Lymphatics: The femoral and inguinal nodes are not enlarged or tender.   Skin: Normal skin turgor, no visible rash and no visible skin lesions.   Neuro/Psych:. Mood and affect are appropriate.     Vitals Vital Signs Height: 6 ft 2 in Weight: 220 lb  BMI Calculated: 28.25 BSA Calculated: 2.26 Blood Pressure: 119 / 56 Temperature: 97.8 F Heart Rate: 71  P URINE CULTURE 24Jan2017 09:09AM Zachary Wang SOURCE : CATHED SPECIMEN TYPE: CATH URINE  Test Name Result Flag Reference CULTURE, URINE Culture, Urine   ===== COLONY COUNT: =====  >=100,000 COLONIES/ML   FINAL REPORT: ENTEROBACTER CLOACAE   SENSITIVITY FOR: ENTEROBACTER CLOACAE    AMOX/CLAVULANIC                        RESISTANT       >=32    PIPERACILLIN/TAZO                      SENSITIVE        <=4    IMIPENEM                               SENSITIVE        0.5    CEFAZOLIN                              RESISTANT       >=64    CEFTRIAXONE                            SENSITIVE        <=1    CEFTAZIDIME                            SENSITIVE        <=1    CEFEPIME                               SENSITIVE        <=1    GENTAMICIN                             SENSITIVE        <=1    TOBRAMYCIN                             SENSITIVE        <=1    CIPROFLOXACIN  SENSITIVE     <=0.25    LEVOFLOXACIN                           SENSITIVE     <=0.12    NITROFURANTOIN                         SENSITIVE         32    TRIMETH/SULFA                          SENSITIVE       <=20     END OF  REPORT  CREATININE with eGFR [id number] 09:00AM Kathie Rhodes SPECIMEN TYPE: BLOOD  Test Name Result Flag Reference CREATININE 1.39 mg/dL  0.50-1.50  PSA 6.62 ng/mL H <=4.00 TEST METHODOLOGY: ECLIA PSA (ELECTROCHEMILUMINESCENCE IMMUNOASSAY)   Urodynamics 09/10/15: Study was performed to evaluate urinary retention.  His full urodynamics study was reviewed in its entirety as noted in the chart.    CMG: Maximum cystometric capacity of 600 cc with normal sensation and some detrusor instability at 400 cc with a contraction pressure of 18 cm H2O.  Pressure-flow: He was able to generate a voluntary contraction of 26 cm H2O but was unable to void.  Fluoroscopy: Markedly trabeculated bladder with pseudo-diverticuli and left vesicoureteral reflux.    Impression: He has an elevated cystometric capacity and a bladder that clearly has been under pressure for some time with multiple pseudo-diverticuli and left-sided reflux. He does generate a detrusor contraction and therefore very likely would respond to outlet resistance surgery.  Assessment   We discussed his PSA results which at 6.62 are elevated but in line with his previous values. Further evaluation of his elevated PSA may be necessary in the future.    I went over the results of the urodynamic study today. We discussed each of the findings of the study and the normal/anticipated results as well as the significance of the results of this study in relation to the reported subjective symptoms.  He does appear to have an abnormal appearing bladder but can generate a detrusor contraction. It is a fairly weak contraction but there may still be some degree of detrusor injury from his overdistended bladder. We discussed the options. Continued indwelling Foley catheter is not a good option. Intermittent self-catheterization is a potential option however I also discussed outlet resistance surgery with him today. We discussed TURP as a means of  reducing his outlet resistance as much as possible with the hope that this would bring about spontaneous voiding. I did discuss with him the fact that he may still have some difficulty urinating although I think the probability of that is low. One way to account for possible difficulty voiding due to continued weak detrusor contraction would be to perform a TURP and at that time place a suprapubic tube which could then be removed once spontaneous voiding had been established. We discussed the risks and complications of the surgery. He would like to proceed with a TURP at this time.    He did have a positive urine culture and will be placed on antibiotics up until the time of surgery.       Plan  1. Septra DS.  2. He will be scheduled for TURP and suprapubic tube placement.

## 2015-10-01 ENCOUNTER — Encounter (HOSPITAL_BASED_OUTPATIENT_CLINIC_OR_DEPARTMENT_OTHER): Payer: Self-pay | Admitting: Urology

## 2015-10-01 DIAGNOSIS — N401 Enlarged prostate with lower urinary tract symptoms: Secondary | ICD-10-CM | POA: Diagnosis not present

## 2015-10-01 NOTE — Progress Notes (Signed)
Patient discharged.  Educated on discharge instructions, follow up appointments, and discharge medications.  Patient and wife stated understanding.  Educated patient on signs and symptoms of infection, when to call a doctor.  AVS signed.  Belongings gathered.  Prescriptions given to patient.  Patient escorted out with wife via wheelchair with NT.

## 2015-10-01 NOTE — Discharge Summary (Signed)
Physician Discharge Summary  Patient ID: Zachary Wang. MRN: [id number] DOB/AGE: October 10, 1940 75 y.o.  Admit date: 09/30/2015 Discharge date: 10/01/2015  Admission Diagnoses: BPH with outlet obstruction and urinary retention  Discharge Diagnoses:  Active Problems:   BPH (benign prostatic hypertrophy) with urinary retention   Discharged Condition: good  Hospital Course: He was admitted for an elective transurethral resection of the prostate and suprapubic tube placement because of possible detrusor hypotonicity noted on urodynamics.  His TURP was uneventful and he underwent a suprapubic tube placement at the time of surgery.  Postoperatively he was noted be doing well with clearing urine the night of surgery.  The following morning he was doing well with no complaints.  No abdominal pain.  His Foley catheter had been removed but he had not voided yet.  We discussed how to use his suprapubic tube with clamping and voiding spontaneously and then using the tube if he is unable to urinate and if he does void it will be used to measure his post void residual. He is felt to be ready for discharge at this time.  He remained afebrile.  He did have a preop culture that was positive and was placed on antibiotics preoperatively.  I will have him remain almost antibiotics until he returns to my office for follow-up and possible suprapubic tube removal at that time.    Discharge Exam: Blood pressure 147/71, pulse 63, temperature 98 F (36.7 C), temperature source Oral, resp. rate 18, height 6\' 2"  (1.88 m), weight 97.523 kg (215 lb), SpO2 99 %. General appearance: alert, cooperative and no distress  His abdomen is soft, flat and nontender.  Suprapubic tube dressing remains intact.  His SP tube is clamped currently.  His Foley catheter has been removed.  Disposition: he will be discharged home today with the suprapubic indwelling as above.  Discharge Instructions    Discharge patient    Complete by:  As  directed             Medication List    TAKE these medications        ALEVE 220 MG Caps  Generic drug:  Naproxen Sodium  Take by mouth as needed.     amLODipine 5 MG tablet  Commonly known as:  NORVASC  Take 5 mg by mouth every morning.     atorvastatin 20 MG tablet  Commonly known as:  LIPITOR  Take 20 mg by mouth every evening.     ciprofloxacin 500 MG tablet  Commonly known as:  CIPRO  Take 1 tablet (500 mg total) by mouth 2 (two) times daily.     hydrochlorothiazide 25 MG tablet  Commonly known as:  HYDRODIURIL  Take 25 mg by mouth every morning.     HYDROcodone-acetaminophen 10-325 MG tablet  Commonly known as:  NORCO  Take 1-2 tablets by mouth every 4 (four) hours as needed for moderate pain. Maximum dose per 24 hours - 8 pills     losartan 100 MG tablet  Commonly known as:  COZAAR  Take 50 mg by mouth 2 (two) times daily.     phenazopyridine 200 MG tablet  Commonly known as:  PYRIDIUM  Take 1 tablet (200 mg total) by mouth 3 (three) times daily as needed for pain.     sulfamethoxazole-trimethoprim 800-160 MG tablet  Commonly known as:  BACTRIM DS,SEPTRA DS  Take 1 tablet by mouth 2 (two) times daily.           Follow-up Information  Follow up with Claybon Jabs, MD On 10/14/2015.   Specialty:  Urology   Why:  For your appiontment at 11:00   Contact information:   [place] [id number] [phone]       Signed: Claybon Jabs 10/01/2015, 8:45 AM

## 2017-03-24 ENCOUNTER — Encounter: Payer: Self-pay | Admitting: Neurology

## 2017-04-09 NOTE — Progress Notes (Addendum)
Zachary Comp. was seen today in the movement disorders clinic for neurologic consultation at the request of Avva, Ravisankar, MD.  The consultation is for the evaluation of L arm tremor and to r/o essential tremor.  Tremor started about 6 months ago and it is getting worse    Tremor: Yes.     At rest or with activation?  rest  Fam hx of tremor?  Yes.  , possibly tremor in mom later in life  Located where?  Only L arm  Affected by caffeine:  unknown (2 cups coffee/day; 2-3 diet soda per day)  Affected by alcohol:  unknown (1 beer/week)  Affected by stress:  Yes.    Affected by fatigue:  Yes.   (worse after playing golf)  Spills soup if on spoon:  No., as he is R hand dominant  Affects ADL's (tying shoes, brushing teeth, etc):  Yes.     Specific symptoms: Voice: unknown but doesn't think so Sleep: "off and on"  Vivid Dreams:  No.  Acting out dreams:  No. Wet Pillows: Yes.   Postural symptoms:  Some mild unsteadiness  Falls?  No. Bradykinesia symptoms: difficulty getting out of a chair Loss of smell:  Yes.   Loss of taste:  Yes.   Urinary Incontinence:  No. Difficulty Swallowing:  No. Handwriting, micrographia: Yes.   Depression:  No. Memory changes:  No. Hallucinations:  No.  visual distortions: No. N/V:  No. Lightheaded:  Yes.   (when bends over, esp when hot)  Syncope: No. Diplopia:  No. Dyskinesia:  No.  Neuroimaging has not previously been performed.   PREVIOUS MEDICATIONS: none to date  ALLERGIES:  No Known Allergies  CURRENT MEDICATIONS:  Outpatient Encounter Prescriptions as of 04/13/2017  Medication Sig  . amLODipine (NORVASC) 5 MG tablet Take 5 mg by mouth every morning.  Marland Kitchen atorvastatin (LIPITOR) 20 MG tablet Take 20 mg by mouth every evening.   . hydrochlorothiazide (HYDRODIURIL) 25 MG tablet Take 25 mg by mouth every morning.   Marland Kitchen losartan (COZAAR) 100 MG tablet Take 50 mg by mouth 2 (two) times daily.  . [DISCONTINUED] ciprofloxacin (CIPRO) 500  MG tablet Take 1 tablet (500 mg total) by mouth 2 (two) times daily.  . [DISCONTINUED] HYDROcodone-acetaminophen (NORCO) 10-325 MG tablet Take 1-2 tablets by mouth every 4 (four) hours as needed for moderate pain. Maximum dose per 24 hours - 8 pills  . [DISCONTINUED] Naproxen Sodium (ALEVE) 220 MG CAPS Take by mouth as needed.  . [DISCONTINUED] phenazopyridine (PYRIDIUM) 200 MG tablet Take 1 tablet (200 mg total) by mouth 3 (three) times daily as needed for pain.  . [DISCONTINUED] sulfamethoxazole-trimethoprim (BACTRIM DS,SEPTRA DS) 800-160 MG tablet Take 1 tablet by mouth 2 (two) times daily.   No facility-administered encounter medications on file as of 04/13/2017.     PAST MEDICAL HISTORY:   Past Medical History:  Diagnosis Date  . Arthritis    hands  . BPH (benign prostatic hyperplasia)   . Elevated PSA   . Foley catheter in place   . History of adenomatous polyp of colon    2014  tubular adenoma  . History of malignant melanoma    s/p  excision left forearm and left axill lymph node bx 09-10-2004  . Hyperlipidemia   . Hypertension   . Peripheral vascular disease ([place])   . Urinary retention     PAST SURGICAL HISTORY:   Past Surgical History:  Procedure Laterality Date  . ANAL FISSURE REPAIR  1984  . COLONOSCOPY N/A 05/02/2013   Procedure: COLONOSCOPY;  Surgeon: Garlan Fair, MD;  Location: [facility];  Service: Endoscopy;  Laterality: N/A;  . INSERTION OF SUPRAPUBIC CATHETER N/A 09/30/2015   Procedure: INSERTION OF SUPRAPUBIC CATHETER;  Surgeon: Kathie Rhodes, MD;  [facility];  Service: Urology;  Laterality: N/A;  . SHOULDER ARTHROSCOPY W/ SUBACROMIAL DECOMPRESSION AND DISTAL CLAVICLE EXCISION Left 05-20-2010   w/  Debridement , Bursectomy, Acromioplasty,  Capsulectomy  . TRANSURETHRAL RESECTION OF PROSTATE N/A 09/30/2015   Procedure: TURP (TRANSURETHRAL RESECTION OF PROSTATE WITH GYRUS;  Surgeon: Kathie Rhodes, MD;  Location: [facility];  Service: Urology;  Laterality: N/A;  . WIDE EXCISION MALIGNANT MELANOMA LEFT FOREARM/  LEFT AXILLA LYMPH NODE BX  09-10-2004    SOCIAL HISTORY:   Social History   Social History  . Marital status: Married    Spouse name: N/A  . Number of children: N/A  . Years of education: N/A   Occupational History  .      Therapist, sports   Social History Main Topics  . Smoking status: Never Smoker  . Smokeless tobacco: Never Used  . Alcohol use Yes     Comment: occasional  . Drug use: No  . Sexual activity: Not on file   Other Topics Concern  . Not on file   Social History Narrative  . No narrative on file    FAMILY HISTORY:   Family Status  Relation Status  . Mother Deceased  . Father Deceased  . Sister Deceased  . Child Alive    ROS:  A complete 10 system review of systems was obtained and was unremarkable apart from what is mentioned above.  PHYSICAL EXAMINATION:    VITALS:   Vitals:   04/13/17 0829  BP: 140/70  Pulse: 74  SpO2: 94%  Weight: 209 lb (94.8 kg)  Height: 6\' 2"  (1.88 m)    GEN:  The patient appears stated age and is in NAD. HEENT:  Normocephalic, atraumatic.  The mucous membranes are moist. The superficial temporal arteries are without ropiness or tenderness. CV:  RRR Lungs:  CTAB Neck/HEME:  There are no carotid bruits bilaterally.  Neurological examination:  Orientation: The patient is alert and oriented x3. Fund of knowledge is appropriate.  Recent and remote memory are intact.  Attention and concentration are normal.    Able to name objects and repeat phrases. Cranial nerves: There is good facial symmetry.  There is facial hypomimia.   Pupils are equal round and reactive to light bilaterally. Fundoscopic exam reveals clear margins bilaterally. Extraocular muscles are intact. The visual fields are full to confrontational testing. The speech is fluent and clear.   He is hypophonic.  Soft palate rises symmetrically and there is  no tongue deviation. Hearing is intact to conversational tone. Sensation: Sensation is intact to light and pinprick throughout (facial, trunk, extremities). Vibration is absent at the bilateral big toe but intact at the ankle. There is no extinction with double simultaneous stimulation. There is no sensory dermatomal level identified. Motor: Strength is 5/5 in the bilateral upper and lower extremities.   Shoulder shrug is equal and symmetric.  There is no pronator drift. Deep tendon reflexes: Deep tendon reflexes are 2/4 at the bilateral biceps, triceps, brachioradialis, patella and achilles. Plantar responses are downgoing bilaterally.  Movement examination: Tone: There is increased tone in the left upper and lower extremity, mild to mod.  Tone in the RUE is  just mildly increased and it is normal in the RLE.   Abnormal movements: There is LUE resting tremor that increases with distraction Coordination:  There is decremation with RAM's, with alternating supination and pronation of the forearm, hand opening and closing, finger taps on the L.  RAMs on the right and in the bilateral LE are good.   Gait and Station: The patient has no difficulty arising out of a deep-seated chair without the use of the hands. The patient's stride length is good with slight decreased arm swing on the L.  The patient has a negative pull test.      Addendum labs:  Labwork was received from his primary care physician.  Lab work was dated 03/18/2016.  Sodium was 142, potassium 3.8, chloride 104, CO2 30, BUN 18 and creatinine 1.3.  White blood cells were 3.8, hemoglobin 14.3, hematocrit 42.2 and platelets 171.  TSH was normal at 2.0.  ASSESSMENT/PLAN:  1.  Idiopathic Parkinson's disease, tremor predominant.  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  The patient has tremor, bradykinesia, rigidity  -We discussed the diagnosis as well as pathophysiology of the disease.  We discussed treatment options as well as prognostic indicators.  Patient education  was provided.  -We discussed that it used to be thought that levodopa would increase risk of melanoma but now it is believed that Parkinsons itself likely increases risk of melanoma. he is to get regular skin checks.  -Greater than 50% of the 60 minute visit was spent in counseling answering questions and talking about what to expect now as well as in the future.  We talked about medication options as well as potential future surgical options.  We talked about safety in the home.  -We decided to add carbidopa/levodopa 25/100.  1/2 tab tid x 1 wk, then 1/2 in am & noon & 1 at night for a week, then 1/2 in am &1 at noon &night for a week, then 1 po tid.  Risks, benefits, side effects and alternative therapies were discussed.  The opportunity to ask questions was given and they were answered to the best of my ability.  The patient expressed understanding and willingness to follow the outlined treatment protocols.  -We discussed community resources in the area including patient support groups and community exercise programs for PD and pt education was provided to the patient.  2.  History of malignant melanoma  -As above, he needs to make sure he is getting his regular skin checks with Dr. Allyson Sabal.  3.  I will see the patient back in the next few months, sooner should new neurologic issues arise.  In the meantime, I will try to get lab work from his primary care physician.   Cc:  Prince Solian, MD

## 2017-04-13 ENCOUNTER — Ambulatory Visit (INDEPENDENT_AMBULATORY_CARE_PROVIDER_SITE_OTHER): Payer: Medicare Other | Admitting: Neurology

## 2017-04-13 ENCOUNTER — Encounter: Payer: Self-pay | Admitting: Neurology

## 2017-04-13 VITALS — BP 140/70 | HR 74 | Ht 74.0 in | Wt 209.0 lb

## 2017-04-13 DIAGNOSIS — G20A1 Parkinson's disease without dyskinesia, without mention of fluctuations: Secondary | ICD-10-CM | POA: Insufficient documentation

## 2017-04-13 DIAGNOSIS — G20 Parkinson's disease: Secondary | ICD-10-CM

## 2017-04-13 DIAGNOSIS — Z85820 Personal history of malignant melanoma of skin: Secondary | ICD-10-CM | POA: Diagnosis not present

## 2017-04-13 MED ORDER — CARBIDOPA-LEVODOPA 25-100 MG PO TABS: 1.0000 | ORAL_TABLET | Freq: Three times a day (TID) | ORAL | 1 refills | Status: DC

## 2017-04-13 NOTE — Patient Instructions (Signed)
1. Start Carbidopa Levodopa as follows:  Take 1/2 tablet three times daily, at least 30 minutes before meals, for one week  Then take 1/2 tablet in the morning, 1/2 tablet in the afternoon, 1 tablet in the evening, at least 30 minutes before meals, for one week  Then take 1/2 tablet in the morning, 1 tablet in the afternoon, 1 tablet in the evening, at least 30 minutes before meals, for one week  Then take 1 tablet three times daily, at least 30 minutes before meals  2. Get skin checks with Dr. Allyson Sabal

## 2017-06-22 NOTE — Progress Notes (Signed)
Zachary Wang. was seen today in the movement disorders clinic for follow-up today.  He was newly diagnosed with Parkinson's disease last visit.  He was started on carbidopa/levodopa 25/100, 1 tablet 3 times per day.  The patient states that he still has tremor and he isn't sure that medication is helping.  Pt denies falls.  Pt denies lightheadedness, near syncope.  No hallucinations.  Mood has been good.  He is playing golf and doing some golf for exercise.  He is thinking about joining [employer] cycle class  PREVIOUS MEDICATIONS: none to date  ALLERGIES:  No Known Allergies  CURRENT MEDICATIONS:  Outpatient Encounter Medications as of 06/24/2017  Medication Sig  . amLODipine (NORVASC) 5 MG tablet Take 5 mg by mouth every morning.  Marland Kitchen atorvastatin (LIPITOR) 20 MG tablet Take 20 mg by mouth every evening.   . carbidopa-levodopa (SINEMET IR) 25-100 MG tablet Take 1 tablet by mouth 3 (three) times daily.  . hydrochlorothiazide (HYDRODIURIL) 25 MG tablet Take 25 mg by mouth every morning.   Marland Kitchen losartan (COZAAR) 100 MG tablet Take 50 mg by mouth 2 (two) times daily.   No facility-administered encounter medications on file as of 06/24/2017.     PAST MEDICAL HISTORY:   Past Medical History:  Diagnosis Date  . Arthritis    hands  . BPH (benign prostatic hyperplasia)   . Elevated PSA   . Foley catheter in place   . History of adenomatous polyp of colon    2014  tubular adenoma  . History of malignant melanoma    s/p  excision left forearm and left axill lymph node bx 09-10-2004  . Hyperlipidemia   . Hypertension   . Peripheral vascular disease ([place])   . Urinary retention     PAST SURGICAL HISTORY:   Past Surgical History:  Procedure Laterality Date  . ANAL FISSURE REPAIR  1984  . SHOULDER ARTHROSCOPY W/ SUBACROMIAL DECOMPRESSION AND DISTAL CLAVICLE EXCISION Left 05-20-2010   w/  Debridement , Bursectomy, Acromioplasty,  Capsulectomy  . WIDE EXCISION MALIGNANT MELANOMA LEFT FOREARM/   LEFT AXILLA LYMPH NODE BX  09-10-2004    SOCIAL HISTORY:   Social History   Socioeconomic History  . Marital status: Married    Spouse name: Not on file  . Number of children: Not on file  . Years of education: Not on file  . Highest education level: Not on file  Social Needs  . Financial resource strain: Not on file  . Food insecurity - worry: Not on file  . Food insecurity - inability: Not on file  . Transportation needs - medical: Not on file  . Transportation needs - non-medical: Not on file  Occupational History    Comment: Therapist, sports  Tobacco Use  . Smoking status: Never Smoker  . Smokeless tobacco: Never Used  Substance and Sexual Activity  . Alcohol use: Yes    Comment: occasional  . Drug use: No  . Sexual activity: Not on file  Other Topics Concern  . Not on file  Social History Narrative  . Not on file    FAMILY HISTORY:   Family Status  Relation Name Status  . Mother  Deceased  . Father  Deceased  . Sister x2 Deceased  . Child x3 Alive    ROS:  A complete 10 system review of systems was obtained and was unremarkable apart from what is mentioned above.  PHYSICAL EXAMINATION:    VITALS:  There were no vitals filed for this visit.  GEN:  The patient appears stated age and is in NAD. HEENT:  Normocephalic, atraumatic.  The mucous membranes are moist. The superficial temporal arteries are without ropiness or tenderness. CV:  RRR Lungs:  CTAB Neck/HEME:  There are no carotid bruits bilaterally.  Neurological examination:  Orientation: The patient is alert and oriented x3. Fund of knowledge is appropriate.  Recent and remote memory are intact.  Attention and concentration are normal.    Able to name objects and repeat phrases. Cranial nerves: There is good facial symmetry.  There is facial hypomimia.   Pupils are equal round and reactive to light bilaterally. Fundoscopic exam reveals clear margins bilaterally. Extraocular muscles are  intact. The visual fields are full to confrontational testing. The speech is fluent and clear.   He is hypophonic.  Soft palate rises symmetrically and there is no tongue deviation. Hearing is intact to conversational tone. Sensation: Sensation is intact to light and pinprick throughout (facial, trunk, extremities). Vibration is absent at the bilateral big toe but intact at the ankle. There is no extinction with double simultaneous stimulation. There is no sensory dermatomal level identified. Motor: Strength is 5/5 in the bilateral upper and lower extremities.   Shoulder shrug is equal and symmetric.  There is no pronator drift. Deep tendon reflexes: Deep tendon reflexes are 2/4 at the bilateral biceps, triceps, brachioradialis, patella and achilles. Plantar responses are downgoing bilaterally.  Movement examination: Tone: There is mild increased tone in the LUE (mild gegenhalten as well) Abnormal movements: There is LUE resting tremor that increases with distraction (tremor is mild and intermittent) Coordination:  There is decremation with RAM's, with alternating supination and pronation of the forearm, hand opening and closing, finger taps on the L.  RAMs on the right and in the bilateral LE are good (same as prior) Gait and Station: The patient has no difficulty arising out of a deep-seated chair without the use of the hands. The patient's stride length is good with slight decreased arm swing on the L.  The patient has a negative pull test.      Addendum labs:  Labwork was received from his primary care physician.  Lab work was dated 03/18/2016.  Sodium was 142, potassium 3.8, chloride 104, CO2 30, BUN 18 and creatinine 1.3.  White blood cells were 3.8, hemoglobin 14.3, hematocrit 42.2 and platelets 171.  TSH was normal at 2.0.  ASSESSMENT/PLAN:  1.  Parkinson's disease, tremor predominant.  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  The patient has tremor, bradykinesia, rigidity  -We discussed the diagnosis as well as  pathophysiology of the disease.  We discussed treatment options as well as prognostic indicators.  Patient education was provided.  -We discussed that it used to be thought that levodopa would increase risk of melanoma but now it is believed that Parkinsons itself likely increases risk of melanoma. he is to get regular skin checks.  -continue carbidopa/levodopa 25/100, 1 po tid.  -talked about pramipexole.  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  He still has some rigidity so I think that it is reasonable.  Will try to see if they will approve the ER version at 1.5 mg daily.  Risks, benefits, side effects and alternative therapies were discussed.  The opportunity to ask questions was given and they were answered to the best of my ability.  The patient expressed understanding and willingness to follow the outlined treatment protocols.  Discussed sleep  attacks, compulsive behaviors, swelling.    -talked about adding cardiovascular exercise to his regimen.    -tells me today he was in [place] war.  Discussed he would be eligible for benefits via VA as he was exposed to agent orange.    -met with our social worker   2.  History of malignant melanoma  -As above, he needs to make sure he is getting his regular skin checks with Dr. Allyson Sabal.   States that he met with him since our last visit  3.  I will see the patient back in the next few months, sooner should new neurologic issues arise.  Much greater than 50% of this visit was spent in counseling and coordinating care.  Total face to face time:  30 min   Cc:  Avva, Ravisankar, MD

## 2017-06-24 ENCOUNTER — Ambulatory Visit (INDEPENDENT_AMBULATORY_CARE_PROVIDER_SITE_OTHER): Payer: Medicare Other | Admitting: Neurology

## 2017-06-24 ENCOUNTER — Encounter: Payer: Self-pay | Admitting: Neurology

## 2017-06-24 ENCOUNTER — Encounter: Payer: Self-pay | Admitting: Psychology

## 2017-06-24 VITALS — BP 118/68 | HR 88 | Ht 74.0 in | Wt 210.0 lb

## 2017-06-24 DIAGNOSIS — G20 Parkinson's disease: Secondary | ICD-10-CM

## 2017-06-24 MED ORDER — PRAMIPEXOLE DIHYDROCHLORIDE ER 1.5 MG PO TB24: 1.5000 mg | ORAL_TABLET | Freq: Every day | ORAL | 3 refills | Status: DC

## 2017-06-24 MED ORDER — PRAMIPEXOLE DIHYDROCHLORIDE 0.125 MG PO TABS: ORAL_TABLET | ORAL | 0 refills | Status: DC

## 2017-06-24 NOTE — Patient Instructions (Signed)
1. Start mirapex (pramipexole) as follows:  0.125 mg - 1 tablet three times per day for a week, then 2 tablets three times per day for a week  Then you will fill the Mirapex 1.5 mg ER tablets and take this one tablet daily.  Both prescriptions have been sent to [facility].

## 2017-06-24 NOTE — Progress Notes (Signed)
I met with Zachary Wang while he was in the clinic today. We reviewed some information on applying for disability in the [place] system. He is connected, but his diagnosis is probably unknown to that system at this time. I printed out the forms and will help review them before he turns them in. I provided him with a name/number for Bill Pack who can help with advocacy issues related to service connected disability. In addition, I provided information on Parkinson's cycle classes in [place].

## 2017-06-30 ENCOUNTER — Telehealth: Payer: Self-pay | Admitting: Psychology

## 2017-06-30 NOTE — Telephone Encounter (Signed)
TC from patient to let me know his [place] paperwork is completed and he will be taking it to the [place] in [place] 11/15 (weather pending).

## 2017-07-15 ENCOUNTER — Encounter: Payer: Self-pay | Admitting: Psychology

## 2017-07-15 NOTE — Progress Notes (Signed)
Completed paperwork necessary for VA benefits that was provided by the patient at the advice of Bill Pack.  The completed document is available for patient to pick up at the office and he has been notified of this.

## 2017-07-16 ENCOUNTER — Ambulatory Visit: Payer: Medicare Other | Admitting: Neurology

## 2017-08-18 ENCOUNTER — Other Ambulatory Visit: Payer: Self-pay | Admitting: Neurology

## 2017-08-18 MED ORDER — PRAMIPEXOLE DIHYDROCHLORIDE ER 1.5 MG PO TB24: 1.5000 mg | ORAL_TABLET | Freq: Every day | ORAL | 0 refills | Status: DC

## 2017-08-18 NOTE — Telephone Encounter (Signed)
Called patient to let him know [facility] cycling form completed. He asked that I fax it to them. Form faxed.   Patient asked that Pramipexole be sent to [facility]. He is doing well on medication. RX sent.

## 2017-10-14 NOTE — Progress Notes (Addendum)
Zachary Comp. was seen today in the movement disorders clinic for follow-up today.  He was newly diagnosed with Parkinson's disease last visit.  He was started on carbidopa/levodopa 25/100, 1 tablet 3 times per day.  The patient states that he still has tremor and he isn't sure that medication is helping.  Pt denies falls.  Pt denies lightheadedness, near syncope.  No hallucinations.  Mood has been good.  He is playing golf and doing some golf for exercise.  He is thinking about joining [employer] cycle class  10/15/17 update: Patient is seen today in follow-up.  Records have been reviewed since last visit.  He is on carbidopa/levodopa 25/100, 1 tablet 3 times per day and last visit pramipexole ER was started and worked up to 1.5 mg daily.  He denies compulsive behaviors.  Denies sleep attacks.  He reports that he is doing well.  Started [employer] cycle class since our last visit and he enjoys that.  He tried a zumba class and he found that the steps were fast paced for him but he liked it.  He is having some dizziness.  He is on 3 BP medications.  He has seen our Education officer, museum in regards to [place] benefits for Parkinson's disease.  PREVIOUS MEDICATIONS: none to date  ALLERGIES:  No Known Allergies  CURRENT MEDICATIONS:  Outpatient Encounter Medications as of 10/15/2017  Medication Sig  . amLODipine (NORVASC) 5 MG tablet Take 5 mg by mouth every morning.  Marland Kitchen atorvastatin (LIPITOR) 20 MG tablet Take 20 mg by mouth every evening.   . carbidopa-levodopa (SINEMET IR) 25-100 MG tablet Take 1 tablet by mouth 3 (three) times daily.  . hydrochlorothiazide (HYDRODIURIL) 25 MG tablet Take 25 mg by mouth every morning.   Marland Kitchen losartan (COZAAR) 100 MG tablet Take 50 mg by mouth 2 (two) times daily.  . Pramipexole Dihydrochloride (MIRAPEX ER) 1.5 MG TB24 Take 1 tablet (1.5 mg total) by mouth daily.   No facility-administered encounter medications on file as of 10/15/2017.     PAST MEDICAL HISTORY:   Past Medical History:    Diagnosis Date  . Arthritis    hands  . BPH (benign prostatic hyperplasia)   . Elevated PSA   . Foley catheter in place   . History of adenomatous polyp of colon    2014  tubular adenoma  . History of malignant melanoma    s/p  excision left forearm and left axill lymph node bx 09-10-2004  . Hyperlipidemia   . Hypertension   . Peripheral vascular disease ([place])   . Urinary retention     PAST SURGICAL HISTORY:   Past Surgical History:  Procedure Laterality Date  . ANAL FISSURE REPAIR  1984  . COLONOSCOPY N/A 05/02/2013   Procedure: COLONOSCOPY;  Surgeon: Garlan Fair, MD;  Location: [facility];  Service: Endoscopy;  Laterality: N/A;  . INSERTION OF SUPRAPUBIC CATHETER N/A 09/30/2015   Procedure: INSERTION OF SUPRAPUBIC CATHETER;  Surgeon: Kathie Rhodes, MD;  Location: [facility];  Service: Urology;  Laterality: N/A;  . SHOULDER ARTHROSCOPY W/ SUBACROMIAL DECOMPRESSION AND DISTAL CLAVICLE EXCISION Left 05-20-2010   w/  Debridement , Bursectomy, Acromioplasty,  Capsulectomy  . TRANSURETHRAL RESECTION OF PROSTATE N/A 09/30/2015   Procedure: TURP (TRANSURETHRAL RESECTION OF PROSTATE WITH GYRUS;  Surgeon: Kathie Rhodes, MD;  Location: [facility];  Service: Urology;  Laterality: N/A;  . WIDE EXCISION MALIGNANT MELANOMA LEFT FOREARM/  LEFT AXILLA LYMPH NODE BX  09-10-2004  SOCIAL HISTORY:   Social History   Socioeconomic History  . Marital status: Married    Spouse name: Not on file  . Number of children: Not on file  . Years of education: Not on file  . Highest education level: Not on file  Social Needs  . Financial resource strain: Not on file  . Food insecurity - worry: Not on file  . Food insecurity - inability: Not on file  . Transportation needs - medical: Not on file  . Transportation needs - non-medical: Not on file  Occupational History    Comment: Therapist, sports  Tobacco Use  . Smoking status: Never Smoker  .  Smokeless tobacco: Never Used  Substance and Sexual Activity  . Alcohol use: Yes    Comment: occasional  . Drug use: No  . Sexual activity: Not on file  Other Topics Concern  . Not on file  Social History Narrative  . Not on file    FAMILY HISTORY:   Family Status  Relation Name Status  . Mother  Deceased  . Father  Deceased  . Sister x2 Deceased  . Child x3 Alive    ROS:  A complete 10 system review of systems was obtained and was unremarkable apart from what is mentioned above.  PHYSICAL EXAMINATION:    VITALS:   Vitals:   10/15/17 0909  BP: 98/68  Pulse: 72  Weight: 223 lb (101.2 kg)  Height: 6\' 2"  (1.88 m)    GEN:  The patient appears stated age and is in NAD. HEENT:  Normocephalic, atraumatic.  The mucous membranes are moist. The superficial temporal arteries are without ropiness or tenderness. CV:  RRR Lungs:  CTAB Neck/HEME:  There are no carotid bruits bilaterally.  Neurological examination:  Orientation: The patient is alert and oriented x3.  Cranial nerves: There is good facial symmetry.  There is facial hypomimia.   Pupils are equal round and reactive to light bilaterally. Fundoscopic exam reveals clear margins bilaterally. Extraocular muscles are intact. The visual fields are full to confrontational testing. The speech is fluent and clear.   He is hypophonic.  Soft palate rises symmetrically and there is no tongue deviation. Hearing is intact to conversational tone. Sensation: Sensation is intact to light and pinprick throughout (facial, trunk, extremities). Vibration is absent at the bilateral big toe but intact at the ankle. There is no extinction with double simultaneous stimulation. There is no sensory dermatomal level identified. Motor: Strength is 5/5 in the bilateral upper and lower extremities.   Shoulder shrug is equal and symmetric.  There is no pronator drift. Deep tendon reflexes: Deep tendon reflexes are 2/4 at the bilateral biceps, triceps,  brachioradialis, patella and achilles. Plantar responses are downgoing bilaterally.  Movement examination: Tone: There is mild increased tone in the LUE Abnormal movements: There is LUE resting tremor that increases with distraction (tremor is mild and intermittent) - same as previous Coordination:  There is decremation with RAM's, with alternating supination and pronation of the forearm, hand opening and closing, finger taps on the L.  RAMs on the right and in the bilateral LE are good (same as prior) Gait and Station: The patient has no difficulty arising out of a deep-seated chair without the use of the hands. The patient's stride length is good with slight decreased arm swing on the L.  The patient has a negative pull test.      Addendum labs:  Labwork was received from his primary care physician.  Lab work was dated 03/18/2016.  Sodium was 142, potassium 3.8, chloride 104, CO2 30, BUN 18 and creatinine 1.3.  White blood cells were 3.8, hemoglobin 14.3, hematocrit 42.2 and platelets 171.  TSH was normal at 2.0.  ASSESSMENT/PLAN:  1.  Parkinson's disease, tremor predominant.  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  The patient has tremor, bradykinesia, rigidity  -We discussed the diagnosis as well as pathophysiology of the disease.  We discussed treatment options as well as prognostic indicators.  Patient education was provided.  -We discussed that it used to be thought that levodopa would increase risk of melanoma but now it is believed that Parkinsons itself likely increases risk of melanoma. he is to get regular skin checks.  -continue carbidopa/levodopa 25/100, 1 po tid.  -will continue pramipexole ER, 1.5 mg daily.    Risks, benefits, side effects and alternative therapies were discussed.  The opportunity to ask questions was given and they were answered to the best of my ability.  The patient expressed understanding and willingness to follow the outlined treatment protocols.  Discussed sleep attacks, compulsive  behaviors, swelling.    -talked about adding cardiovascular exercise to his regimen.    -he has applied for VA benefits   2.  History of malignant melanoma  -As above, he needs to make sure he is getting his regular skin checks and is going to have an appointment on 3/19 (Dr. Veleta Miners)  3.  Dizziness  -BP was low and he is on HCTZ, losartan and amlodipine.  He has an appointment next week with Avva, Ravisankar, MD  (addendum:  Pt saw PCP 11/01/17 and notes received.  Pt was to keep a log of home BP for 2-3 months)  4.  Much greater than 50% of this visit was spent in counseling and coordinating care.  Total face to face time:  25 min.  Follow up is anticipated in the next few months, sooner should new neurologic issues arise.   Cc:  Prince Solian, MD

## 2017-10-15 ENCOUNTER — Encounter: Payer: Self-pay | Admitting: Neurology

## 2017-10-15 ENCOUNTER — Ambulatory Visit (INDEPENDENT_AMBULATORY_CARE_PROVIDER_SITE_OTHER): Payer: Medicare Other | Admitting: Neurology

## 2017-10-15 VITALS — BP 98/68 | HR 72 | Ht 74.0 in | Wt 223.0 lb

## 2017-10-15 DIAGNOSIS — G20 Parkinson's disease: Secondary | ICD-10-CM | POA: Diagnosis not present

## 2017-10-15 MED ORDER — CARBIDOPA-LEVODOPA 25-100 MG PO TABS: 1.0000 | ORAL_TABLET | Freq: Three times a day (TID) | ORAL | 1 refills | Status: DC

## 2017-11-02 ENCOUNTER — Encounter: Payer: Self-pay | Admitting: Psychology

## 2017-11-02 NOTE — Progress Notes (Signed)
Telephone call with the patient today.  He has been working with [employer] for [employer] and advocacy with applying for his VA benefits.  He has been approved for these benefits and has received some back pay for his service-connected disease.

## 2017-11-21 ENCOUNTER — Other Ambulatory Visit: Payer: Self-pay | Admitting: Neurology

## 2018-02-11 NOTE — Progress Notes (Signed)
Zachary Comp. was seen today in the movement disorders clinic for follow-up today.  He was newly diagnosed with Parkinson's disease last visit.  He was started on carbidopa/levodopa 25/100, 1 tablet 3 times per day.  The patient states that he still has tremor and he isn't sure that medication is helping.  Pt denies falls.  Pt denies lightheadedness, near syncope.  No hallucinations.  Mood has been good.  He is playing golf and doing some golf for exercise.  He is thinking about joining [employer] cycle class  10/15/17 update: Patient is seen today in follow-up.  Records have been reviewed since last visit.  He is on carbidopa/levodopa 25/100, 1 tablet 3 times per day and last visit pramipexole ER was started and worked up to 1.5 mg daily.  He denies compulsive behaviors.  Denies sleep attacks.  He reports that he is doing well.  Started [employer] cycle class since our last visit and he enjoys that.  He tried a zumba class and he found that the steps were fast paced for him but he liked it.  He is having some dizziness.  He is on 3 BP medications.  He has seen our Education officer, museum in regards to [place] benefits for Parkinson's disease.  02/15/18 update: Patient is seen today in follow-up for Parkinson's disease.  Patient is on carbidopa/levodopa 25/100, 1 tablet 3 times per day (8am/1pm/6pm).   Cannot tell when worn off.   Patient is also on pramipexole ER, 1.5 mg daily.  Patient has had no sleep attacks.  No compulsive behaviors.  No hallucinations.  No falls.  No lightheadedness or near syncope.  Doing [employer] cycle class and doing classes at [employer] fitness [employer].  Having trouble getting off of the couch without rocking.  PREVIOUS MEDICATIONS: none to date  ALLERGIES:  No Known Allergies  CURRENT MEDICATIONS:  Outpatient Encounter Medications as of 02/15/2018  Medication Sig  . amLODipine (NORVASC) 5 MG tablet Take 5 mg by mouth every morning.  Marland Kitchen atorvastatin (LIPITOR) 20 MG tablet Take 20 mg by mouth every  evening.   . carbidopa-levodopa (SINEMET IR) 25-100 MG tablet 2 in the AM, 2 in the afternoon, 1 in the evening  . hydrochlorothiazide (HYDRODIURIL) 25 MG tablet Take 25 mg by mouth every morning.   Marland Kitchen losartan (COZAAR) 100 MG tablet Take 50 mg by mouth 2 (two) times daily.  . Pramipexole Dihydrochloride 1.5 MG TB24 Take 1 tablet (1.5 mg total) by mouth daily.  . [DISCONTINUED] carbidopa-levodopa (SINEMET IR) 25-100 MG tablet Take 1 tablet by mouth 3 (three) times daily.  . [DISCONTINUED] Pramipexole Dihydrochloride 1.5 MG TB24 TAKE 1 TABLET DAILY   No facility-administered encounter medications on file as of 02/15/2018.     PAST MEDICAL HISTORY:   Past Medical History:  Diagnosis Date  . Arthritis    hands  . BPH (benign prostatic hyperplasia)   . Elevated PSA   . Foley catheter in place   . History of adenomatous polyp of colon    2014  tubular adenoma  . History of malignant melanoma    s/p  excision left forearm and left axill lymph node bx 09-10-2004  . Hyperlipidemia   . Hypertension   . Peripheral vascular disease ([place])   . Urinary retention     PAST SURGICAL HISTORY:   Past Surgical History:  Procedure Laterality Date  . ANAL FISSURE REPAIR  1984  . COLONOSCOPY N/A 05/02/2013   Procedure: COLONOSCOPY;  Surgeon: Ursula Alert  Wynetta Emery, MD;  Location: [facility];  Service: Endoscopy;  Laterality: N/A;  . INSERTION OF SUPRAPUBIC CATHETER N/A 09/30/2015   Procedure: INSERTION OF SUPRAPUBIC CATHETER;  Surgeon: Kathie Rhodes, MD;  Location: [facility];  Service: Urology;  Laterality: N/A;  . SHOULDER ARTHROSCOPY W/ SUBACROMIAL DECOMPRESSION AND DISTAL CLAVICLE EXCISION Left 05-20-2010   w/  Debridement , Bursectomy, Acromioplasty,  Capsulectomy  . TRANSURETHRAL RESECTION OF PROSTATE N/A 09/30/2015   Procedure: TURP (TRANSURETHRAL RESECTION OF PROSTATE WITH GYRUS;  Surgeon: Kathie Rhodes, MD;  Location: [facility];  Service: Urology;  Laterality: N/A;  .  WIDE EXCISION MALIGNANT MELANOMA LEFT FOREARM/  LEFT AXILLA LYMPH NODE BX  09-10-2004    SOCIAL HISTORY:   Social History   Socioeconomic History  . Marital status: Married    Spouse name: Not on file  . Number of children: Not on file  . Years of education: Not on file  . Highest education level: Not on file  Occupational History    Comment: Therapist, sports  Social Needs  . Financial resource strain: Not on file  . Food insecurity:    Worry: Not on file    Inability: Not on file  . Transportation needs:    Medical: Not on file    Non-medical: Not on file  Tobacco Use  . Smoking status: Never Smoker  . Smokeless tobacco: Never Used  Substance and Sexual Activity  . Alcohol use: Yes    Comment: occasional  . Drug use: No  . Sexual activity: Not on file  Lifestyle  . Physical activity:    Days per week: Not on file    Minutes per session: Not on file  . Stress: Not on file  Relationships  . Social connections:    Talks on phone: Not on file    Gets together: Not on file    Attends religious service: Not on file    Active member of club or organization: Not on file    Attends meetings of clubs or organizations: Not on file    Relationship status: Not on file  . Intimate partner violence:    Fear of current or ex partner: Not on file    Emotionally abused: Not on file    Physically abused: Not on file    Forced sexual activity: Not on file  Other Topics Concern  . Not on file  Social History Narrative  . Not on file    FAMILY HISTORY:   Family Status  Relation Name Status  . Mother  Deceased  . Father  Deceased  . Sister x2 Deceased  . Child x3 Alive    ROS: No chest pain.  No shortness of breath.  No dysuria.  No urinary incontinence.  PHYSICAL EXAMINATION:    VITALS:   Vitals:   02/15/18 1033  BP: 130/70  Pulse: (!) 59  SpO2: 97%  Weight: 224 lb 4 oz (101.7 kg)  Height: 6\' 2"  (1.88 m)    GEN:  The patient appears stated age and  is in NAD. HEENT:  Normocephalic, atraumatic.  The mucous membranes are moist. The superficial temporal arteries are without ropiness or tenderness. CV:  RRR Lungs:  CTAB Neck/HEME:  There are no carotid bruits bilaterally.  Neurological examination:  Orientation: The patient is alert and oriented x3. Cranial nerves: There is good facial symmetry. The speech is fluent and clear. Soft palate rises symmetrically and there is no tongue deviation. Hearing is intact to  conversational tone. Sensation: Sensation is intact to light touch throughout Motor: Strength is 5/5 in the bilateral upper and lower extremities.   Shoulder shrug is equal and symmetric.  There is no pronator drift.  Movement examination: Tone: There is mild to mod increased tone in the LUE extremity Abnormal movements: There is intermittent LUE resting tremor. Coordination:  There is decremation, with any form of RAMS, including alternating supination and pronation of the forearm, hand opening and closing, finger taps, heel taps and toe taps on the L.   Gait and Station: The patient has no difficulty arising out of a deep-seated chair without the use of the hands. The patient's stride length is good with slight decreased arm swing on the L.  The patient has a negative pull test.       ASSESSMENT/PLAN:  1.  Parkinson's disease, tremor predominant.  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  The patient has tremor, bradykinesia, rigidity  -We discussed the diagnosis as well as pathophysiology of the disease.  We discussed treatment options as well as prognostic indicators.  Patient education was provided.  -We discussed that it used to be thought that levodopa would increase risk of melanoma but now it is believed that Parkinsons itself likely increases risk of melanoma. he is to get regular skin checks.  -increase carbidopa/levodopa 25/100, 2 at 7am/2 at 11am/1 at 4pm  -Continue pramipexole ER, 1.5 mg daily.   -he has applied for [employer] benefits and is  further along in that process.  He is going to ask the [place] next time if he can start receiving medications from them.  For now, I refilled medications here.  -encouraged him to attend PD symposium  -pt education provided today  -refer to neurorehab center for PT.  2.  History of malignant melanoma  -He is following regularly with dermatology.  3.  Dizziness  -Primary care and I are monitoring meds closely.  He is on 3 different meds.   4.  Follow up is anticipated in the next few months, sooner should new neurologic issues arise.  Much greater than 50% of this visit was spent in counseling and coordinating care.  Total face to face time:  30 min    Cc:  Avva, Ravisankar, MD

## 2018-02-15 ENCOUNTER — Encounter: Payer: Self-pay | Admitting: Neurology

## 2018-02-15 ENCOUNTER — Ambulatory Visit (INDEPENDENT_AMBULATORY_CARE_PROVIDER_SITE_OTHER): Payer: Medicare Other | Admitting: Neurology

## 2018-02-15 VITALS — BP 130/70 | HR 59 | Ht 74.0 in | Wt 224.2 lb

## 2018-02-15 DIAGNOSIS — G20 Parkinson's disease: Secondary | ICD-10-CM | POA: Diagnosis not present

## 2018-02-15 MED ORDER — PRAMIPEXOLE DIHYDROCHLORIDE ER 1.5 MG PO TB24: 1.0000 | ORAL_TABLET | Freq: Every day | ORAL | 2 refills | Status: DC

## 2018-02-15 MED ORDER — CARBIDOPA-LEVODOPA 25-100 MG PO TABS: ORAL_TABLET | ORAL | 1 refills | Status: DC

## 2018-02-15 NOTE — Patient Instructions (Addendum)
1.  Increase carbidopa/levodopa 25/100, 2 tablets at 7am/2 tablets at 11 am/1 tablet at 4pm 2.  I will refer you to the neurorehab center for PT.  If you don't hear from them, call me in a week or two. 3.  Make a follow up in 4 months  Registration is OPEN!    Third Annual Parkinson's Education Symposium   To register: [email or web address]      Search:  [employer] person attending individually Questions: [place], [place] or [email or web address]

## 2018-02-28 ENCOUNTER — Telehealth: Payer: Self-pay | Admitting: Neurology

## 2018-02-28 NOTE — Telephone Encounter (Signed)
LMOM letting patient know that they have referral at the neuro rehab center but are behind in working their referrals. Made him aware he can call them at [phone] to schedule with them directly and to call us back with any other questions.

## 2018-02-28 NOTE — Telephone Encounter (Signed)
Patient called to check on the Status of his Referral for therapy. Please Call. Thanks

## 2018-03-30 ENCOUNTER — Ambulatory Visit: Payer: Medicare Other | Attending: Neurology | Admitting: Physical Therapy

## 2018-03-30 ENCOUNTER — Encounter: Payer: Self-pay | Admitting: Physical Therapy

## 2018-03-30 DIAGNOSIS — R29818 Other symptoms and signs involving the nervous system: Secondary | ICD-10-CM

## 2018-03-30 DIAGNOSIS — R293 Abnormal posture: Secondary | ICD-10-CM

## 2018-03-30 DIAGNOSIS — R2689 Other abnormalities of gait and mobility: Secondary | ICD-10-CM | POA: Diagnosis not present

## 2018-03-31 NOTE — Therapy (Signed)
[facility] [address] [place], [place], [address] Phone: [phone]   Fax:  [phone]  Physical Therapy Evaluation  Patient Details  Name: Zachary Wang. MRN: [id number] Date of Birth: [birth year] Referring Provider: Alonza Bogus   Encounter Date: 03/30/2018  PT End of Session - 03/31/18 0656    Visit Number  1    Number of Visits  10    Date for PT Re-Evaluation  05/29/18    Authorization Type  [employer] Medicare    Authorization Time Period  PT cert period 7/86/76-72/09/47    PT Start Time  0850    PT Stop Time  0932    PT Time Calculation (min)  42 min    Activity Tolerance  Patient tolerated treatment well    Behavior During Therapy  [facility] for tasks assessed/performed       Past Medical History:  Diagnosis Date  . Arthritis    hands  . BPH (benign prostatic hyperplasia)   . Elevated PSA   . Foley catheter in place   . History of adenomatous polyp of colon    2014  tubular adenoma  . History of malignant melanoma    s/p  excision left forearm and left axill lymph node bx 09-10-2004  . Hyperlipidemia   . Hypertension   . Peripheral vascular disease ([place])   . Urinary retention     Past Surgical History:  Procedure Laterality Date  . ANAL FISSURE REPAIR  1984  . COLONOSCOPY N/A 05/02/2013   Procedure: COLONOSCOPY;  Surgeon: Garlan Fair, MD;  Location: [facility];  Service: Endoscopy;  Laterality: N/A;  . INSERTION OF SUPRAPUBIC CATHETER N/A 09/30/2015   Procedure: INSERTION OF SUPRAPUBIC CATHETER;  Surgeon: Kathie Rhodes, MD;  Location: [facility];  Service: Urology;  Laterality: N/A;  . SHOULDER ARTHROSCOPY W/ SUBACROMIAL DECOMPRESSION AND DISTAL CLAVICLE EXCISION Left 05-20-2010   w/  Debridement , Bursectomy, Acromioplasty,  Capsulectomy  . TRANSURETHRAL RESECTION OF PROSTATE N/A 09/30/2015   Procedure: TURP (TRANSURETHRAL RESECTION OF PROSTATE WITH GYRUS;  Surgeon: Kathie Rhodes,  MD;  Location: [facility];  Service: Urology;  Laterality: N/A;  . WIDE EXCISION MALIGNANT MELANOMA LEFT FOREARM/  LEFT AXILLA LYMPH NODE BX  09-10-2004    There were no vitals filed for this visit.   Subjective Assessment - 03/30/18 0853    Subjective  Pt reports being diagnosed with PD 03/2017, and has seen Dr. Carles Collet several times.  It's hard sometimes to get off of sofa sometimes.  Reports balance "comes and goes" at times.  No reported falls.    Patient Stated Goals  No specific goals, but work on things that seem to be out of the ordinary (in regards to Parkinson's disease).    Currently in Pain?  No/denies         [facility] PT Assessment - 03/30/18 0856      Assessment   Medical Diagnosis  Parkinson's disease    Referring Provider  Tat, Wells Guiles    Onset Date/Surgical Date  04/13/17    Hand Dominance  Right      Precautions   Precautions  Fall      Balance Screen   Has the patient fallen in the past 6 months  No    Has the patient had a decrease in activity level because of a fear of falling?   No    Is the patient reluctant to leave their home because of a  fear of falling?   No      Home Film/video editor residence    Living Arrangements  Spouse/significant other    Available Help at Discharge  Family    Type of [place] to enter    Entrance Stairs-Number of Steps  4    Entrance Stairs-Rails  Right    [place]  Two level;Able to live on main level with bedroom/bathroom      Prior Function   Level of Independence  Independent with basic ADLs    Vocation  Retired   in January   [employer] PD class, participates in [employer] classes; enjoys golfing (typically walks the course)      Observation/Other Assessments   Focus on Therapeutic Outcomes (FOTO)   NA      Posture/Postural Control   Posture/Postural Control  Postural limitations    Postural Limitations  Rounded Shoulders;Forward head       Tone   Assessment Location  --   mild increase in tone LLE     ROM / Strength   AROM / PROM / Strength  Strength      Strength   Overall Strength  Within functional limits for tasks performed      Transfers   Transfers  Sit to Stand;Stand to Sit    Sit to Stand  6: Modified independent (Device/Increase time);Without upper extremity assist;From chair/3-in-1    Five time sit to stand comments   10.47    Stand to Sit  6: Modified independent (Device/Increase time);Without upper extremity assist;To chair/3-in-1      Ambulation/Gait   Ambulation/Gait  Yes    Ambulation/Gait Assistance  7: Independent    Ambulation Distance (Feet)  200 Feet    Assistive device  None    Gait Pattern  Step-through pattern;Decreased arm swing - left;Decreased step length - left;Trunk flexed;Decreased trunk rotation    Ambulation Surface  Level;Indoor    Gait velocity  8.82 sec = 3.72 ft/sec      Standardized Balance Assessment   Standardized Balance Assessment  Timed Up and Go Test      Timed Up and Go Test   Normal TUG (seconds)  9.72    Manual TUG (seconds)  10.62    Cognitive TUG (seconds)  9.56    TUG Comments  Scores >13.5-15 seconds indicate increased fall risk      High Level Balance   High Level Balance Comments  MiniBESTest score:  25/28 (See full note for details)         Mini-BESTest: Balance Evaluation Systems Test  2005-2013 [employer]. All rights reserved. ________________________________________________________________________________________Anticipatory_________Subscore___5__/6 1. SIT TO STAND Instruction: "Cross your arms across your chest. Try not to use your hands unless you must.Do not let your legs lean against the back of the chair when you stand. Please stand up now." X(2) Normal: Comes to stand without use of hands and stabilizes independently. (1) Moderate: Comes to stand WITH use of hands on first attempt. (0) Severe: Unable to stand up  from chair without assistance, OR needs several attempts with use of hands. 2. RISE TO TOES Instruction: "Place your feet shoulder width apart. Place your hands on your hips. Try to rise as high as you can onto your toes. I will count out loud to 3 seconds. Try to hold this pose for at least 3 seconds. Look straight ahead. Rise now." X(2)  Normal: Stable for 3 s with maximum height. (1) Moderate: Heels up, but not full range (smaller than when holding hands), OR noticeable instability for 3 s. (0) Severe: < 3 s. 3. STAND ON ONE LEG Instruction: "Look straight ahead. Keep your hands on your hips. Lift your leg off of the ground behind you without touching or resting your raised leg upon your other standing leg. Stay standing on one leg as long as you can. Look straight ahead. Lift now." Left: Time in Seconds Trial 1:_____Trial 2:_____ X(2) Normal: 20 s. (1) Moderate: < 20 s. (0) Severe: Unable. Right: Time in Seconds Trial 1:___18 sec__Trial 2:_____ (2) Normal: 20 s. X(1) Moderate: < 20 s. (0) Severe: Unable To score each side separately use the trial with the longest time. To calculate the sub-score and total score use the side [left or right] with the lowest numerical score [i.e. the worse side]. ______________________________________________________________________________________Reactive Postural Control___________Subscore:___6__/6 4. COMPENSATORY STEPPING CORRECTION- FORWARD Instruction: "Stand with your feet shoulder width apart, arms at your sides. Lean forward against my hands beyond your forward limits. When I let go, do whatever is necessary, including taking a step, to avoid a fall." X(2) Normal: Recovers independently with a single, large step (second realignment step is allowed). (1) Moderate: More than one step used to recover equilibrium. (0) Severe: No step, OR would fall if not caught, OR falls spontaneously. 5. COMPENSATORY STEPPING CORRECTION- BACKWARD Instruction:  "Stand with your feet shoulder width apart, arms at your sides. Lean backward against my hands beyond your backward limits. When I let go, do whatever is necessary, including taking a step, to avoid a fall." X(2) Normal: Recovers independently with a single, large step. (1) Moderate: More than one step used to recover equilibrium. (0) Severe: No step, OR would fall if not caught, OR falls spontaneously. 6. COMPENSATORY STEPPING CORRECTION- LATERAL Instruction: "Stand with your feet together, arms down at your sides. Lean into my hand beyond your sideways limit. When I let go, do whatever is necessary, including taking a step, to avoid a fall." Left X(2) Normal: Recovers independently with 1 step (crossover or lateral OK). (1) Moderate: Several steps to recover equilibrium. (0) Severe: Falls, or cannot step. Right X(2) Normal: Recovers independently with 1 step (crossover or lateral OK). (1) Moderate: Several steps to recover equilibrium. (0) Severe: Falls, or cannot step. Use the side with the lowest score to calculate sub-score and total score. ____________________________________________________________________________________Sensory Orientation_____________Subscore:_______5__/6 7. STANCE (FEET TOGETHER); EYES OPEN, FIRM SURFACE Instruction: "Place your hands on your hips. Place your feet together until almost touching. Look straight ahead. Be as stable and still as possible, until I say stop." Time in seconds:________ X(2) Normal: 30 s. (1) Moderate: < 30 s. (0) Severe: Unable. 8. STANCE (FEET TOGETHER); EYES CLOSED, FOAM SURFACE Instruction: "Step onto the foam. Place your hands on your hips. Place your feet together until almost touching. Be as stable and still as possible, until I say stop. I will start timing when you close your eyes." Time in seconds:________ (2) Normal: 30 s. X(1) Moderate: < 30 s. (0) Severe: Unable. 9. INCLINE- EYES CLOSED Instruction: "Step onto the  incline ramp. Please stand on the incline ramp with your toes toward the top. Place your feet shoulder width apart and have your arms down at your sides. I will start timing when you close your eyes." Time in seconds:________ X(2) Normal: Stands independently 30 s and aligns with gravity. (1) Moderate: Stands independently <30 s OR aligns with surface. (0)  Severe: Unable. _________________________________________________________________________________________Dynamic Gait ______Subscore______9__/10 10. CHANGE IN GAIT SPEED Instruction: "Begin walking at your normal speed, when I tell you 'fast', walk as fast as you can. When I say 'slow', walk very slowly." X(2) Normal: Significantly changes walking speed without imbalance. (1) Moderate: Unable to change walking speed or signs of imbalance. (0) Severe: Unable to achieve significant change in walking speed AND signs of imbalance. [place] - HORIZONTAL Instruction: "Begin walking at your normal speed, when I say "right", turn your head and look to the right. When I say "left" turn your head and look to the left. Try to keep yourself walking in a straight line." X(2) Normal: performs head turns with no change in gait speed and good balance. (1) Moderate: performs head turns with reduction in gait speed. (0) Severe: performs head turns with imbalance. 12. WALK WITH PIVOT TURNS Instruction: "Begin walking at your normal speed. When I tell you to 'turn and stop', turn as quickly as you can, face the opposite direction, and stop. After the turn, your feet should be close together." X(2) Normal: Turns with feet close FAST (< 3 steps) with good balance. (1) Moderate: Turns with feet close SLOW (>4 steps) with good balance. (0) Severe: Cannot turn with feet close at any speed without imbalance. 13. STEP OVER OBSTACLES Instruction: "Begin walking at your normal speed. When you get to the box, step over it, not around it and keep  walking." (2) Normal: Able to step over box with minimal change of gait speed and with good balance. X(1) Moderate: Steps over box but touches box OR displays cautious behavior by slowing gait. (0) Severe: Unable to step over box OR steps around box. 14. TIMED UP & GO WITH DUAL TASK [3 METER WALK] Instruction TUG: "When I say 'Go', stand up from chair, walk at your normal speed across the tape on the floor, turn around, and come back to sit in the chair." Instruction TUG with Dual Task: "Count backwards by threes starting at ___. When I say 'Go', stand up from chair, walk at your normal speed across the tape on the floor, turn around, and come back to sit in the chair. Continue counting backwards the entire time." TUG: ________seconds; Dual Task TUG: ________seconds X(2) Normal: No noticeable change in sitting, standing or walking while backward counting when compared to TUG without Dual Task. (1) Moderate: Dual Task affects either counting OR walking (>10%) when compared to the TUG without Dual Task. (0) Severe: Stops counting while walking OR stops walking while counting. When scoring item 14, if subject's gait speed slows more than 10% between the TUG without and with a Dual Task the score should be decreased by a point. TOTAL SCORE: ____26____/28       Objective measurements completed on examination: See above findings.              PT Education - 03/31/18 [id number]    Education Details  Educated patient in rationale for early intervention with therapies for Parkinson's specific exercises to improve functional mobility patterns; discussed/invited patient to [facility] Parkinson's community group    Person(s) Educated  Patient    Methods  Explanation    Comprehension  Verbalized understanding          PT Long Term Goals - 03/31/18 0702      PT LONG TERM GOAL #1   Title  Pt will be independent with HEP for Parkinson's -specific exercises to improve functional mobility,  transfers,  and balance.  TARGET 04/29/18    Time  5    Period  Weeks    Status  New    Target Date  04/29/18      PT LONG TERM GOAL #2   Title  Pt will perform at least 8 of 10 reps of sit<>stand transfers from surfaces <18" without UE support, independently, for improved functional strength with transfers.    Time  5    Period  Weeks    Status  New    Target Date  04/29/18      PT LONG TERM GOAL #3   Title  Pt will verbalize understanding of fall prevention education and local Parkinson's disease-related resources.    Time  5    Period  Weeks    Status  New    Target Date  04/29/18      PT LONG TERM GOAL #4   Title  Pt will ambulate at least 1000 ft over outdoor and unlevel surfaces, simulating golf course, with improved LUE arm swing and LLE step length (with cues less than 25% of the time)    Time  5    Period  Weeks    Status  New    Target Date  04/29/18             Plan - 03/31/18 0657    Clinical Impression Statement  Pt is a 77 year old male with diagnosis of Parkinson's disease in August 2018.  He presents to OP PT with reports of stiffness and occasional balance difficulties, with PT noting postural abnormalities, bradykinesia, decreased timing and coordination of gait, decreased functional strength for low surface transfers (per patient report).  He is participating in PD cycling class and golfing on a weekly basis, but otherwise, he does not have a formal Parkinson's-specific HEP.  He would benefit from skilled PT to address the above stated deficits for improved functional mobility and decreased fall risk.    History and Personal Factors relevant to plan of care:  PD dx 03/2017    Clinical Presentation  Stable    Clinical Presentation due to:  PD as neuro-degenerative disease, no formal PD specific HEP    Clinical Decision Making  Low    Rehab Potential  Good    PT Frequency  2x / week    PT Duration  4 weeks    PT Treatment/Interventions  ADLs/Self Care Home  Management;Gait training;Functional mobility training;Therapeutic activities;Therapeutic exercise;Balance training;Patient/family education;Neuromuscular re-education    PT Next Visit Plan  Initiate PWR! Moves-supine, sit, standing; low surface transfer training, gait training for improved LUE arm swing and LLE step length    Consulted and Agree with Plan of Care  Patient       Patient will benefit from skilled therapeutic intervention in order to improve the following deficits and impairments:  Abnormal gait, Decreased balance, Decreased mobility, Difficulty walking, Decreased strength, Postural dysfunction  Visit Diagnosis: Other abnormalities of gait and mobility  Abnormal posture  Other symptoms and signs involving the nervous system     Problem List Patient Active Problem List   Diagnosis Date Noted  . Parkinson's disease ([place]) 04/13/2017  . BPH (benign prostatic hypertrophy) with urinary retention 09/30/2015    Yer Castello W. 03/31/2018, 7:09 AM  Frazier Butt., PT   [facility] [address] [place], [place], [address] Phone: [phone]   Fax:  [phone]  Name: Zachary Wang. MRN: [id number] Date of Birth:  12/11/1940  

## 2018-04-01 ENCOUNTER — Encounter: Payer: Self-pay | Admitting: Physical Therapy

## 2018-04-01 ENCOUNTER — Ambulatory Visit: Payer: Medicare Other | Admitting: Physical Therapy

## 2018-04-01 DIAGNOSIS — R293 Abnormal posture: Secondary | ICD-10-CM

## 2018-04-01 DIAGNOSIS — R2689 Other abnormalities of gait and mobility: Secondary | ICD-10-CM | POA: Diagnosis not present

## 2018-04-01 DIAGNOSIS — R29818 Other symptoms and signs involving the nervous system: Secondary | ICD-10-CM

## 2018-04-01 NOTE — Patient Instructions (Signed)
Provided PWR! Moves handouts for: -PWR! Moves in sitting -PWR! Moves in standing -PWR! Up and PWR! Rock in supine  To be performed 10-20 reps, 1-2 times per day Educated that muscle soreness may occur, due to using muscles that may not have been used as much, but muscle pain with these exercises is not normal and to stop if that occurs  -Provided YouTube PWR! Moves website information to reinforce the above PWR! Moves given as HEP

## 2018-04-01 NOTE — Therapy (Signed)
[facility] [address] [place], [place], [address] Phone: [phone]   Fax:  [phone]  Physical Therapy Treatment  Patient Details  Name: Zachary Wang. MRN: [id number] Date of Birth: February 19, 1941 Referring Provider: Alonza Bogus   Encounter Date: 04/01/2018  PT End of Session - 04/01/18 1602    Visit Number  2    Number of Visits  10    Date for PT Re-Evaluation  05/29/18    Authorization Type  UHC Medicare    Authorization Time Period  PT cert period 02/09/93-85/46/27    PT Start Time  0758    PT Stop Time  0837    PT Time Calculation (min)  39 min    Activity Tolerance  Patient tolerated treatment well    Behavior During Therapy  [facility] for tasks assessed/performed       Past Medical History:  Diagnosis Date  . Arthritis    hands  . BPH (benign prostatic hyperplasia)   . Elevated PSA   . Foley catheter in place   . History of adenomatous polyp of colon    2014  tubular adenoma  . History of malignant melanoma    s/p  excision left forearm and left axill lymph node bx 09-10-2004  . Hyperlipidemia   . Hypertension   . Peripheral vascular disease ([place])   . Urinary retention     Past Surgical History:  Procedure Laterality Date  . ANAL FISSURE REPAIR  1984  . COLONOSCOPY N/A 05/02/2013   Procedure: COLONOSCOPY;  Surgeon: Garlan Fair, MD;  Location: [facility];  Service: Endoscopy;  Laterality: N/A;  . INSERTION OF SUPRAPUBIC CATHETER N/A 09/30/2015   Procedure: INSERTION OF SUPRAPUBIC CATHETER;  Surgeon: Kathie Rhodes, MD;  Location: [facility];  Service: Urology;  Laterality: N/A;  . SHOULDER ARTHROSCOPY W/ SUBACROMIAL DECOMPRESSION AND DISTAL CLAVICLE EXCISION Left 05-20-2010   w/  Debridement , Bursectomy, Acromioplasty,  Capsulectomy  . TRANSURETHRAL RESECTION OF PROSTATE N/A 09/30/2015   Procedure: TURP (TRANSURETHRAL RESECTION OF PROSTATE WITH GYRUS;  Surgeon: Kathie Rhodes,  MD;  Location: [facility];  Service: Urology;  Laterality: N/A;  . WIDE EXCISION MALIGNANT MELANOMA LEFT FOREARM/  LEFT AXILLA LYMPH NODE BX  09-10-2004    There were no vitals filed for this visit.  Subjective Assessment - 04/01/18 0757    Subjective  Still just a little stiff in the mornings.    Patient Stated Goals  No specific goals, but work on things that seem to be out of the ordinary (in regards to Parkinson's disease).    Currently in Pain?  No/denies                       [facility] Adult PT Treatment/Exercise - 04/01/18 0001      Ambulation/Gait   Ambulation/Gait  Yes    Ambulation/Gait Assistance  7: Independent    Ambulation Distance (Feet)  430 Feet   then 230   Assistive device  None    Gait Pattern  Step-through pattern;Decreased arm swing - left;Decreased step length - left;Trunk flexed;Decreased trunk rotation    Ambulation Surface  Level;Indoor    Gait Comments  Cues provided for upright posture, for increased effort for improved LUE arm swing and L step length        P[facility]) - 04/01/18 0758    PWR! exercises  Moves in supine;Moves in standing;Moves in sitting    PWR!  Up  x 10 reps through shoulders, through hips    PWR! Rock  x 10 reps each side    Comments  Supine PWR! Moves-verbal and tactile cues for technique    PWR! Up  x 10 reps    PWR! Rock  x 10 reps each side    PWR! Twist  x 5 reps each side    PWR Step  x 10 reps each side    Comments  PWR! Moves in standing:  verbal and visual cues given for technique and for amplitude.  Provided rationale for movement patterns in standing in relation to functional activities.    PWR! Up  x 10     PWR! Rock  x 10 reps each side    PWR! Twist  x 10 reps each side    PWR! Step  x 10 reps each side    Comments  PWR! Moves in sitting:  verbal and visual cues provided for technique, with occasional tactile cues given for posture and for LUE positioning.  Provided rationale for seated  PWR! Moves in relation to functional activities.          PT Education - 04/01/18 1559    Education Details  Initiated HEP:  PWR! Moves in sitting and standing; PWR! Up and PWR! Rock in supine; educated in rationale for these exercises regarding movement patterns and their relation to funcitonal mobility    Person(s) Educated  Patient    Methods  Explanation;Demonstration;Handout    Comprehension  Verbalized understanding;Returned demonstration;Verbal cues required;Need further instruction          PT Long Term Goals - 03/31/18 0702      PT LONG TERM GOAL #1   Title  Pt will be independent with HEP for Parkinson's -specific exercises to improve functional mobility, transfers, and balance.  TARGET 04/29/18    Time  5    Period  Weeks    Status  New    Target Date  04/29/18      PT LONG TERM GOAL #2   Title  Pt will perform at least 8 of 10 reps of sit<>stand transfers from surfaces <18" without UE support, independently, for improved functional strength with transfers.    Time  5    Period  Weeks    Status  New    Target Date  04/29/18      PT LONG TERM GOAL #3   Title  Pt will verbalize understanding of fall prevention education and local Parkinson's disease-related resources.    Time  5    Period  Weeks    Status  New    Target Date  04/29/18      PT LONG TERM GOAL #4   Title  Pt will ambulate at least 1000 ft over outdoor and unlevel surfaces, simulating golf course, with improved LUE arm swing and LLE step length (with cues less than 25% of the time)    Time  5    Period  Weeks    Status  New    Target Date  04/29/18            Plan - 04/01/18 1602    Clinical Impression Statement  Initiated PWR! Moves in supine, sitting, and standing as part of HEP today, with education in large movement patterns and their relation to improved posture, weightshifting, trunk flexibility, and initial stepping.  Provided visual, verbal and at times, tactile cues for technique  and large amplitude movement patterns with  LUE.  Following exercises, gait activities to reinforce posture and large movement patterns for improved step length and arm swing.  Pt will continue to benefit from skilled PT to address funcitonal mobiltiy, balance, posture and gait.      Rehab Potential  Good    PT Frequency  2x / week    PT Duration  4 weeks    PT Treatment/Interventions  ADLs/Self Care Home Management;Gait training;Functional mobility training;Therapeutic activities;Therapeutic exercise;Balance training;Patient/family education;Neuromuscular re-education    PT Next Visit Plan  Review HEP for PWR! Moves, low surface transfer training, gait training activities, posture and compliant surface work    Oncologist with Plan of Care  Patient       Patient will benefit from skilled therapeutic intervention in order to improve the following deficits and impairments:  Abnormal gait, Decreased balance, Decreased mobility, Difficulty walking, Decreased strength, Postural dysfunction  Visit Diagnosis: Abnormal posture  Other symptoms and signs involving the nervous system  Other abnormalities of gait and mobility     Problem List Patient Active Problem List   Diagnosis Date Noted  . Parkinson's disease ([place]) 04/13/2017  . BPH (benign prostatic hypertrophy) with urinary retention 09/30/2015    Amanii Snethen W. 04/01/2018, 4:05 PM  Frazier Butt., PT    [facility] [address] [place], [place], [address] Phone: [phone]   Fax:  [phone]  Name: Zachary Wang. MRN: [id number] Date of Birth: 1940-11-19

## 2018-04-13 ENCOUNTER — Encounter: Payer: Self-pay | Admitting: Physical Therapy

## 2018-04-13 ENCOUNTER — Ambulatory Visit: Payer: Medicare Other | Admitting: Physical Therapy

## 2018-04-13 DIAGNOSIS — R2689 Other abnormalities of gait and mobility: Secondary | ICD-10-CM

## 2018-04-13 DIAGNOSIS — R29818 Other symptoms and signs involving the nervous system: Secondary | ICD-10-CM

## 2018-04-13 DIAGNOSIS — R293 Abnormal posture: Secondary | ICD-10-CM

## 2018-04-14 NOTE — Therapy (Signed)
[facility] [address] [place], [place], [address] Phone: [phone]   Fax:  [phone]  Physical Therapy Treatment  Patient Details  Name: Zachary Wang. MRN: [id number] Date of Birth: 1941/01/19 Referring Provider: Alonza Bogus   Encounter Date: 04/13/2018  PT End of Session - 04/14/18 1207    Visit Number  3    Number of Visits  10    Date for PT Re-Evaluation  05/29/18    Authorization Type  UHC Medicare    Authorization Time Period  PT cert period 9/56/38-75/64/33    PT Start Time  0802    PT Stop Time  0844    PT Time Calculation (min)  42 min    Activity Tolerance  Patient tolerated treatment well    Behavior During Therapy  [facility] for tasks assessed/performed       Past Medical History:  Diagnosis Date  . Arthritis    hands  . BPH (benign prostatic hyperplasia)   . Elevated PSA   . Foley catheter in place   . History of adenomatous polyp of colon    2014  tubular adenoma  . History of malignant melanoma    s/p  excision left forearm and left axill lymph node bx 09-10-2004  . Hyperlipidemia   . Hypertension   . Peripheral vascular disease ([place])   . Urinary retention     Past Surgical History:  Procedure Laterality Date  . ANAL FISSURE REPAIR  1984  . COLONOSCOPY N/A 05/02/2013   Procedure: COLONOSCOPY;  Surgeon: Garlan Fair, MD;  [facility]: [facility];  Service: Endoscopy;  Laterality: N/A;  . INSERTION OF SUPRAPUBIC CATHETER N/A 09/30/2015   Procedure: INSERTION OF SUPRAPUBIC CATHETER;  Surgeon: Kathie Rhodes, MD;  Location: [facility];  Service: Urology;  Laterality: N/A;  . SHOULDER ARTHROSCOPY W/ SUBACROMIAL DECOMPRESSION AND DISTAL CLAVICLE EXCISION Left 05-20-2010   w/  Debridement , Bursectomy, Acromioplasty,  Capsulectomy  . TRANSURETHRAL RESECTION OF PROSTATE N/A 09/30/2015   Procedure: TURP (TRANSURETHRAL RESECTION OF PROSTATE WITH GYRUS;  Surgeon: Kathie Rhodes,  MD;  Location: [facility];  Service: Urology;  Laterality: N/A;  . WIDE EXCISION MALIGNANT MELANOMA LEFT FOREARM/  LEFT AXILLA LYMPH NODE BX  09-10-2004    There were no vitals filed for this visit.  Subjective Assessment - 04/13/18 0804    Subjective  Been doing the exercises, mostly the standing ones.    Patient Stated Goals  No specific goals, but work on things that seem to be out of the ordinary (in regards to Parkinson's disease).    Currently in Pain?  No/denies                       [facility] Adult PT Treatment/Exercise - 04/14/18 0001      Ambulation/Gait   Ambulation/Gait  Yes    Ambulation/Gait Assistance  7: Independent    Ambulation Distance (Feet)  345 Feet    Assistive device  None    Gait Pattern  Step-through pattern;Decreased arm swing - left;Decreased step length - left;Trunk flexed;Decreased trunk rotation    Ambulation Surface  Level;Indoor    Gait Comments  Initial cues provided for upright posture and reciprocal arm swing.  Performed gait activities at end of session after completion of PWR! MOves activities, as a way to tie in increased amplitude and intensity of movement from exercises into gait.        [facility]) -  04/13/18 0807    PWR! exercises  Moves in supine;Moves in sitting;Moves in standing    PWR! Up  x 20 reps through hips, x 20 reps through shoulders    PWR! Rock  x 10 reps each side    PWR! Twist  x 5 reps each side    PWR! Step  x 3 reps each side    Comments  Supine PWR! Moves-verbal and tactile cues for technique and intensity of movements; explained rationale of PWR! Moves in supine    PWR! Up  x 20 reps    PWR! Rock  x 10 reps each side    PWR! Twist  x 10 reps each side    PWR Step  x 10 reps each side    Comments  PWR! Moves in standing-review with pt able to return demo with visual and verbal cues    PWR! Up  x 20    PWR! Rock  x 10 reps each side    PWR! Twist  x 10 reps each side    PWR! Step  x 10  reps each side    Comments  Review of PWR! Moves in sitting, with visual/verbal cues for technique       Balance Exercises - 04/14/18 1204      Balance Exercises: Standing   Standing Eyes Opened  Wide (BOA);Head turns;Foam/compliant surface;5 reps   Head nods   Stepping Strategy  Posterior;Lateral;Foam/compliant surface;UE support;10 reps   alternating legs on blue foam   Marching Limitations  Marching in place on foam surface x 10 reps        PT Education - 04/14/18 1207    Education Details  Review of PWR! Moves; verbally added Supine PWR! Twist and PWR! Step    Person(s) Educated  Patient    Methods  Explanation;Demonstration    Comprehension  Verbalized understanding;Returned demonstration;Verbal cues required          PT Long Term Goals - 03/31/18 9163      PT LONG TERM GOAL #1   Title  Pt will be independent with HEP for Parkinson's -specific exercises to improve functional mobility, transfers, and balance.  TARGET 04/29/18    Time  5    Period  Weeks    Status  New    Target Date  04/29/18      PT LONG TERM GOAL #2   Title  Pt will perform at least 8 of 10 reps of sit<>stand transfers from surfaces <18" without UE support, independently, for improved functional strength with transfers.    Time  5    Period  Weeks    Status  New    Target Date  04/29/18      PT LONG TERM GOAL #3   Title  Pt will verbalize understanding of fall prevention education and local Parkinson's disease-related resources.    Time  5    Period  Weeks    Status  New    Target Date  04/29/18      PT LONG TERM GOAL #4   Title  Pt will ambulate at least 1000 ft over outdoor and unlevel surfaces, simulating golf course, with improved LUE arm swing and LLE step length (with cues less than 25% of the time)    Time  5    Period  Weeks    Status  New    Target Date  04/29/18            Plan -  04/14/18 1209    Clinical Impression Statement  Pt returns to PT today after being on out  of town trip.  Skilled PT session focused on review of PWR! Moves HEP, with pt demo understanding, but benefiting from additional cues for technique, intensity, and rationale for movement.  Pt does report some difficulty on compliant/unlevel surfaces while [place] ([place] and would benefit from additional skilled PT to address balance, posture, and gait.    Rehab Potential  Good    PT Frequency  2x / week    PT Duration  4 weeks    PT Treatment/Interventions  ADLs/Self Care Home Management;Gait training;Functional mobility training;Therapeutic activities;Therapeutic exercise;Balance training;Patient/family education;Neuromuscular re-education    PT Next Visit Plan  Compliant surface balance work (rockerboard/BOSU), low surface transfer training, PWR! Moves standing on compliant surfaces? or FLOW?    Consulted and Agree with Plan of Care  Patient       Patient will benefit from skilled therapeutic intervention in order to improve the following deficits and impairments:  Abnormal gait, Decreased balance, Decreased mobility, Difficulty walking, Decreased strength, Postural dysfunction  Visit Diagnosis: Abnormal posture  Other symptoms and signs involving the nervous system  Other abnormalities of gait and mobility     Problem List Patient Active Problem List   Diagnosis Date Noted  . Parkinson's disease ([place]) 04/13/2017  . BPH (benign prostatic hypertrophy) with urinary retention 09/30/2015    Tynan Boesel W. 04/14/2018, 12:13 PM  Frazier Butt., PT   [facility] [address] [place], [place], [address] Phone: [phone]   Fax:  [phone]  Name: Zachary Wang. MRN: [id number] Date of Birth: February 10, 1941

## 2018-04-15 ENCOUNTER — Ambulatory Visit: Payer: Medicare Other | Admitting: Physical Therapy

## 2018-04-15 ENCOUNTER — Encounter: Payer: Self-pay | Admitting: Physical Therapy

## 2018-04-15 DIAGNOSIS — R2689 Other abnormalities of gait and mobility: Secondary | ICD-10-CM | POA: Diagnosis not present

## 2018-04-15 DIAGNOSIS — R29818 Other symptoms and signs involving the nervous system: Secondary | ICD-10-CM

## 2018-04-15 DIAGNOSIS — R293 Abnormal posture: Secondary | ICD-10-CM

## 2018-04-15 NOTE — Therapy (Signed)
[facility] [address] [place], [place], [address] Phone: [phone]   Fax:  [phone]  Physical Therapy Treatment  Patient Details  Name: Zachary Wang. MRN: [id number] Date of Birth: June 30, 1941 Referring Provider: Alonza Bogus   Encounter Date: 04/15/2018  PT End of Session - 04/15/18 1210    Visit Number  4    Number of Visits  10    Date for PT Re-Evaluation  05/29/18    Authorization Type  [employer]HC Medicare    Authorization Time Period  PT cert period 03/19/20-22/48/25    PT Start Time  0804    PT Stop Time  0844    PT Time Calculation (min)  40 min    Activity Tolerance  Patient tolerated treatment well    Behavior During Therapy  [facility] for tasks assessed/performed       Past Medical History:  Diagnosis Date  . Arthritis    hands  . BPH (benign prostatic hyperplasia)   . Elevated PSA   . Foley catheter in place   . History of adenomatous polyp of colon    2014  tubular adenoma  . History of malignant melanoma    s/p  excision left forearm and left axill lymph node bx 09-10-2004  . Hyperlipidemia   . Hypertension   . Peripheral vascular disease ([place])   . Urinary retention     Past Surgical History:  Procedure Laterality Date  . ANAL FISSURE REPAIR  1984  . COLONOSCOPY N/A 05/02/2013   Procedure: COLONOSCOPY;  Surgeon: Garlan Fair, MD;  Location: [facility];  Service: Endoscopy;  Laterality: N/A;  . INSERTION OF SUPRAPUBIC CATHETER N/A 09/30/2015   Procedure: INSERTION OF SUPRAPUBIC CATHETER;  Surgeon: Kathie Rhodes, MD;  Location: [facility];  Service: Urology;  Laterality: N/A;  . SHOULDER ARTHROSCOPY W/ SUBACROMIAL DECOMPRESSION AND DISTAL CLAVICLE EXCISION Left 05-20-2010   w/  Debridement , Bursectomy, Acromioplasty,  Capsulectomy  . TRANSURETHRAL RESECTION OF PROSTATE N/A 09/30/2015   Procedure: TURP (TRANSURETHRAL RESECTION OF PROSTATE WITH GYRUS;  Surgeon: Kathie Rhodes,  MD;  [facility];  Service: Urology;  Laterality: N/A;  . WIDE EXCISION MALIGNANT MELANOMA LEFT FOREARM/  LEFT AXILLA LYMPH NODE BX  09-10-2004    There were no vitals filed for this visit.  Subjective Assessment - 04/15/18 0806    Subjective  Tried all the exercises yesterday-no difficulty.    Patient Stated Goals  No specific goals, but work on things that seem to be out of the ordinary (in regards to Parkinson's disease).    Currently in Pain?  No/denies                       [facility] Adult PT Treatment/Exercise - 04/15/18 0809      Transfers   Transfers  Sit to Stand;Stand to Sit    Sit to Stand  6: Modified independent (Device/Increase time);Without upper extremity assist;From chair/3-in-1    Stand to Sit  6: Modified independent (Device/Increase time);Without upper extremity assist;To chair/3-in-1    Number of Reps  1 set;Other sets (comment)   from 20" mat surface, 16" chair surface   Transfer Cueing  Cues to stand with upright, tall, PWR! Up posture upon standing each rep        PWR [facility]) - 04/15/18 0840    PWR! exercises  Moves in standing    PWR! Up  x 10 reps  PWR! Rock  x 10 reps each side    PWR! Twist  x 10 reps each side    PWR Step  x 10 reps each side    Comments  PWR! Moves in standing on compliant surfaces       Balance Exercises - 04/15/18 1202      Balance Exercises: Standing   Standing Eyes Opened  Wide (BOA);Narrow base of support (BOS);Foam/compliant surface;Head turns;5 reps   Head nods with intermittent UE support   Standing Eyes Closed  Wide (BOA);Narrow base of support (BOS);Foam/compliant surface;1 rep;10 secs   UE support   Stepping Strategy  Anterior;Posterior;Foam/compliant surface;UE support;10 reps    Rockerboard  Anterior/posterior;Lateral;Head turns;EO;EC;10 seconds   EO head turns/nods, EC head steady, UE alt lifts   Balance Beam  Marching in place x 10 reps with cues for widened BOS    Other  Standing Exercises  On rockerboard in anterior/posterior and lateral positions for hip/ankle strategy work with intermittent UE support, then forward step taps off rockerboard, x 10 reps each leg.  Discussed balance strategy work, as well as rationale for balance work on compliant surfaces-to address somatosensory, visual, and vestibular systems for balance.      Occasional cues provided for upright posture, looking ahead during balance exercises; in parallel bars with balance exercises, pt requires min guard/close supervision.  PT Education - 04/15/18 1209    Education Details  Verbally instructed and demonstrated trying standing PWR! Moves on compliant (pillow/towel/cushion) surface at home    Person(s) Educated  Patient    Methods  Explanation;Demonstration    Comprehension  Verbalized understanding;Returned demonstration          PT Long Term Goals - 03/31/18 0702      PT LONG TERM GOAL #1   Title  Pt will be independent with HEP for Parkinson's -specific exercises to improve functional mobility, transfers, and balance.  TARGET 04/29/18    Time  5    Period  Weeks    Status  New    Target Date  04/29/18      PT LONG TERM GOAL #2   Title  Pt will perform at least 8 of 10 reps of sit<>stand transfers from surfaces <18" without UE support, independently, for improved functional strength with transfers.    Time  5    Period  Weeks    Status  New    Target Date  04/29/18      PT LONG TERM GOAL #3   Title  Pt will verbalize understanding of fall prevention education and local Parkinson's disease-related resources.    Time  5    Period  Weeks    Status  New    Target Date  04/29/18      PT LONG TERM GOAL #4   Title  Pt will ambulate at least 1000 ft over outdoor and unlevel surfaces, simulating golf course, with improved LUE arm swing and LLE step length (with cues less than 25% of the time)    Time  5    Period  Weeks    Status  New    Target Date  04/29/18             Plan - 04/15/18 1210    Clinical Impression Statement  Skilled PT session today focused on balance activities on compliant surfaces as well as transfers from varied height surfaces.  On compliant surfaces with head movements or eyes closed, he requires UE support due to increased postural  sway.  Pt will continue to benefit from skilled PT to address balance, posture, and gait, with focus on high intensity, large amplitude movement patterns.    Rehab Potential  Good    PT Frequency  2x / week    PT Duration  4 weeks    PT Treatment/Interventions  ADLs/Self Care Home Management;Gait training;Functional mobility training;Therapeutic activities;Therapeutic exercise;Balance training;Patient/family education;Neuromuscular re-education    PT Next Visit Plan  Continue compliant surface balance work (rockerboard/BOSU)-ask if he's tried PWR! Moves on compliant surface at home; Try PWR! FLOW?, posture exercises    Consulted and Agree with Plan of Care  Patient       Patient will benefit from skilled therapeutic intervention in order to improve the following deficits and impairments:  Abnormal gait, Decreased balance, Decreased mobility, Difficulty walking, Decreased strength, Postural dysfunction  Visit Diagnosis: Other symptoms and signs involving the nervous system  Abnormal posture     Problem List Patient Active Problem List   Diagnosis Date Noted  . Parkinson's disease ([place]) 04/13/2017  . BPH (benign prostatic hypertrophy) with urinary retention 09/30/2015    Haley Roza W. 04/15/2018, 12:14 PM  Frazier Butt., PT   [facility] [address] [place], [place], [address] Phone: [phone]   Fax:  [phone]  Name: Zachary Wang. MRN: [id number] Date of Birth: [birth year]

## 2018-04-20 ENCOUNTER — Ambulatory Visit: Payer: Medicare Other | Attending: Neurology | Admitting: Physical Therapy

## 2018-04-20 ENCOUNTER — Encounter: Payer: Self-pay | Admitting: Physical Therapy

## 2018-04-20 DIAGNOSIS — R29818 Other symptoms and signs involving the nervous system: Secondary | ICD-10-CM | POA: Insufficient documentation

## 2018-04-20 DIAGNOSIS — R293 Abnormal posture: Secondary | ICD-10-CM | POA: Diagnosis not present

## 2018-04-20 DIAGNOSIS — R2689 Other abnormalities of gait and mobility: Secondary | ICD-10-CM | POA: Diagnosis present

## 2018-04-20 NOTE — Patient Instructions (Addendum)
Calf / Gastoc: Runners' Stretch I    One leg back and straight, other forward and bent supporting weight, lean forward, gently stretching calf of back leg. Hold __30__ seconds. Repeat with other leg. Repeat _3___ times. Do __1-2_ sessions per day.  Copyright  VHI. All rights reserved.  Gastroc / Heel Cord Stretch - On Step    Stand with heels over edge of stair. Holding rail, lower heels until stretch is felt in calf of legs. Repeat _3__ times 30 seconds. Do _1-2__ times per day.  Copyright  VHI. All rights reserved.

## 2018-04-20 NOTE — Therapy (Signed)
[facility] [address] [place], [place], [address] Phone: [phone]   Fax:  [phone]  Physical Therapy Treatment  Patient Details  Name: Zachary Wang. MRN: [id number] Date of Birth: Jan 04, 1941 Referring Provider: Alonza Bogus   Encounter Date: 04/20/2018  PT End of Session - 04/20/18 0905    Visit Number  5    Number of Visits  10    Date for PT Re-Evaluation  05/29/18    Authorization Type  [employer] Medicare    Authorization Time Period  PT cert period 5/99/35-70/17/79    PT Start Time  0805    PT Stop Time  0848    PT Time Calculation (min)  43 min    Activity Tolerance  Patient tolerated treatment well    Behavior During Therapy  [facility] for tasks assessed/performed       Past Medical History:  Diagnosis Date  . Arthritis    hands  . BPH (benign prostatic hyperplasia)   . Elevated PSA   . Foley catheter in place   . History of adenomatous polyp of colon    2014  tubular adenoma  . History of malignant melanoma    s/p  excision left forearm and left axill lymph node bx 09-10-2004  . Hyperlipidemia   . Hypertension   . Peripheral vascular disease ([place])   . Urinary retention     Past Surgical History:  Procedure Laterality Date  . ANAL FISSURE REPAIR  1984  . COLONOSCOPY N/A 05/02/2013   Procedure: COLONOSCOPY;  Surgeon: Garlan Fair, MD;  Location: [facility];  Service: Endoscopy;  Laterality: N/A;  . INSERTION OF SUPRAPUBIC CATHETER N/A 09/30/2015   Procedure: INSERTION OF SUPRAPUBIC CATHETER;  Surgeon: Kathie Rhodes, MD;  Location: [facility];  Service: Urology;  Laterality: N/A;  . SHOULDER ARTHROSCOPY W/ SUBACROMIAL DECOMPRESSION AND DISTAL CLAVICLE EXCISION Left 05-20-2010   w/  Debridement , Bursectomy, Acromioplasty,  Capsulectomy  . TRANSURETHRAL RESECTION OF PROSTATE N/A 09/30/2015   Procedure: TURP (TRANSURETHRAL RESECTION OF PROSTATE WITH GYRUS;  Surgeon: Kathie Rhodes,  MD;  Location[facility];  Service: Urology;  Laterality: N/A;  . WIDE EXCISION MALIGNANT MELANOMA LEFT FOREARM/  LEFT AXILLA LYMPH NODE BX  09-10-2004    There were no vitals filed for this visit.  Subjective Assessment - 04/20/18 0807    Subjective  No changes.  Been trying the exercises.    Patient Stated Goals  No specific goals, but work on things that seem to be out of the ordinary (in regards to Parkinson's disease).    Currently in Pain?  No/denies                       [facility] Adult PT Treatment/Exercise - 04/20/18 0001      Posture/Postural Control   Posture/Postural Control  Postural limitations    Postural Limitations  Rounded Shoulders;Forward head    Posture Comments  Postural awareness and strengthening exercises:  Standing tall as the wall:  with best upright posture through shoulders and neck, then at column with tall posture:  scapular squeezes x 8 reps, then neck retraction with towel behind head x 8 reps       Exercises   Exercises  Knee/Hip      Knee/Hip Exercises: Stretches   Active Hamstring Stretch  Right;Left;2 reps;30 seconds    Active Hamstring Stretch Limitations  At counter in standing with hip "hinge" posteriorly  Gastroc Stretch  Right;Left;3 reps;30 seconds    Gastroc Stretch Limitations  Runner's stretch at counter    Other Knee/Hip Stretches  Gastroc stretch with single foot propped on 2" block, 2 reps x 30 seconds          Balance Exercises - 04/20/18 0808      Balance Exercises: Standing   Standing Eyes Opened  Wide (BOA);Narrow base of support (BOS);Foam/compliant surface;Head turns;5 reps   Head nods   Standing Eyes Closed  Wide (BOA);Narrow base of support (BOS);Foam/compliant surface;Head turns;5 reps   Head nods   Wall Bumps  Hip    Wall Bumps-Hips  Eyes opened;Anterior/posterior;20 reps    Rockerboard  Anterior/posterior;Lateral;Head turns;EO;EC;10 seconds   Hip/ankle strategy work on rockerboard    Partial Tandem Stance  Eyes open;Eyes closed;Foam/compliant surface;Intermittent upper extremity support;5 reps   Head nods   Heel Raises Limitations  x 20 reps   in corner for ankle strategy   Toe Raise Limitations  x 20 reps   in corner for ankle strategy   Other Standing Exercises  On rockerboard-forward step taps x 10 reps; on BOSU:  lateral weightshifting for limits of stability, anterior/posterior weightshifting for limits of stability with intermittent UE support.  On BOSU:  EO with head turns, head nods, then EC x 10 seconds, 2 sets.        PT Education - 04/20/18 0903    Education Details  Additions of lower extremity stretches to HEP-see instructions; discussed importance of consistent stretching for improved flexibility (and potentially to prevent nighttime cramping)    Person(s) Educated  Patient    Methods  Explanation;Demonstration;Handout    Comprehension  Verbalized understanding;Returned demonstration;Verbal cues required          PT Long Term Goals - 03/31/18 1856      PT LONG TERM GOAL #1   Title  Pt will be independent with HEP for Parkinson's -specific exercises to improve functional mobility, transfers, and balance.  TARGET 04/29/18    Time  5    Period  Weeks    Status  New    Target Date  04/29/18      PT LONG TERM GOAL #2   Title  Pt will perform at least 8 of 10 reps of sit<>stand transfers from surfaces <18" without UE support, independently, for improved functional strength with transfers.    Time  5    Period  Weeks    Status  New    Target Date  04/29/18      PT LONG TERM GOAL #3   Title  Pt will verbalize understanding of fall prevention education and local Parkinson's disease-related resources.    Time  5    Period  Weeks    Status  New    Target Date  04/29/18      PT LONG TERM GOAL #4   Title  Pt will ambulate at least 1000 ft over outdoor and unlevel surfaces, simulating golf course, with improved LUE arm swing and LLE step length (with  cues less than 25% of the time)    Time  5    Period  Weeks    Status  New    Target Date  04/29/18            Plan - 04/20/18 0906    Clinical Impression Statement  Skilled PT session today focused on compliant surface balance activities, balance strategy work, and lower extremity and postural stretching.  Pt reports cramping in  lower extremities most nights, and has reported tightness in hamstrings and gastrocs last 2 days with exercises in therapy; therefore, HEP additions made to reflect lower extremity stretches.  Pt will continue to benefit from skilled PT to address balance, posture, and gait.    Rehab Potential  Good    PT Frequency  2x / week    PT Duration  4 weeks    PT Treatment/Interventions  ADLs/Self Care Home Management;Gait training;Functional mobility training;Therapeutic activities;Therapeutic exercise;Balance training;Patient/family education;Neuromuscular re-education    PT Next Visit Plan  Continue compliant surface balance work (rockerboard/BOSU)-review leg stretches, work on posture exercises; compliant surface/corner balance exercises for home?    Consulted and Agree with Plan of Care  Patient       Patient will benefit from skilled therapeutic intervention in order to improve the following deficits and impairments:  Abnormal gait, Decreased balance, Decreased mobility, Difficulty walking, Decreased strength, Postural dysfunction  Visit Diagnosis: Abnormal posture  Other symptoms and signs involving the nervous system     Problem List Patient Active Problem List   Diagnosis Date Noted  . Parkinson's disease ([place]) 04/13/2017  . BPH (benign prostatic hypertrophy) with urinary retention 09/30/2015    MARRIOTT,AMY W. 04/20/2018, 9:09 AM  Frazier Butt., PT   [facility] [address] [place], [place], [address] Phone: [phone]   Fax:  [phone]  Name: Chantry Headen. MRN:  [id number] Date of Birth: 1940/11/27

## 2018-04-21 ENCOUNTER — Encounter: Payer: Self-pay | Admitting: Physical Therapy

## 2018-04-21 ENCOUNTER — Ambulatory Visit: Payer: Medicare Other | Admitting: Physical Therapy

## 2018-04-21 DIAGNOSIS — R293 Abnormal posture: Secondary | ICD-10-CM | POA: Diagnosis not present

## 2018-04-21 DIAGNOSIS — R2689 Other abnormalities of gait and mobility: Secondary | ICD-10-CM

## 2018-04-21 DIAGNOSIS — R29818 Other symptoms and signs involving the nervous system: Secondary | ICD-10-CM

## 2018-04-21 NOTE — Therapy (Signed)
[facility] [address] [place], [place], [address] Phone: [phone]   Fax:  [phone]  Physical Therapy Treatment  Patient Details  Name: Zachary Wang. MRN: [id number] Date of Birth: 03-Nov-1940 Referring Provider: Alonza Bogus   Encounter Date: 04/21/2018  PT End of Session - 04/21/18 1026    Visit Number  6    Number of Visits  10    Date for PT Re-Evaluation  05/29/18    Authorization Type  [employer]HC Medicare    Authorization Time Period  PT cert period 2/54/27-02/07/75    PT Start Time  0803    PT Stop Time  0847    PT Time Calculation (min)  44 min    Activity Tolerance  Patient tolerated treatment well    Behavior During Therapy  [facility] for tasks assessed/performed       Past Medical History:  Diagnosis Date  . Arthritis    hands  . BPH (benign prostatic hyperplasia)   . Elevated PSA   . Foley catheter in place   . History of adenomatous polyp of colon    2014  tubular adenoma  . History of malignant melanoma    s/p  excision left forearm and left axill lymph node bx 09-10-2004  . Hyperlipidemia   . Hypertension   . Peripheral vascular disease ([place])   . Urinary retention     Past Surgical History:  Procedure Laterality Date  . ANAL FISSURE REPAIR  1984  . COLONOSCOPY N/A 05/02/2013   Procedure: COLONOSCOPY;  Surgeon: Garlan Fair, MD;  Location: [facility];  Service: Endoscopy;  Laterality: N/A;  . INSERTION OF SUPRAPUBIC CATHETER N/A 09/30/2015   Procedure: INSERTION OF SUPRAPUBIC CATHETER;  Surgeon: Kathie Rhodes, MD;  Location: [facility];  Service: Urology;  Laterality: N/A;  . SHOULDER ARTHROSCOPY W/ SUBACROMIAL DECOMPRESSION AND DISTAL CLAVICLE EXCISION Left 05-20-2010   w/  Debridement , Bursectomy, Acromioplasty,  Capsulectomy  . TRANSURETHRAL RESECTION OF PROSTATE N/A 09/30/2015   Procedure: TURP (TRANSURETHRAL RESECTION OF PROSTATE WITH GYRUS;  Surgeon: Kathie Rhodes,  MD;  Location[facility];  Service: Urology;  Laterality: N/A;  . WIDE EXCISION MALIGNANT MELANOMA LEFT FOREARM/  LEFT AXILLA LYMPH NODE BX  09-10-2004    There were no vitals filed for this visit.  Subjective Assessment - 04/21/18 0805    Subjective  No pain, just stiffness that takes a while to get rid of.  Tried the leg stretches yesterday.  All the exercises help.    Patient Stated Goals  No specific goals, but work on things that seem to be out of the ordinary (in regards to Parkinson's disease).    Currently in Pain?  Yes    Pain Score  6     Pain Location  Other (Comment)   Generalized stiffness   Pain Descriptors / Indicators  --   stiffness   Pain Onset  More than a month ago    Pain Frequency  Intermittent    Aggravating Factors   worse in the mornings    Pain Relieving Factors  exercises and stretches                       [facility] Adult PT Treatment/Exercise - 04/21/18 0807      Ambulation/Gait   Ambulation/Gait  Yes    Ambulation/Gait Assistance  7: Independent    Ambulation Distance (Feet)  300 Feet    Assistive  device  None    Gait Pattern  Step-through pattern;Decreased arm swing - left;Decreased step length - left;Trunk flexed;Decreased trunk rotation    Ambulation Surface  Level;Indoor    Gait Comments  Cues for continued upright posture and arm swing.  Forward/back walking transition, 10 ft x 2 reps      Posture/Postural Control   Posture/Postural Control  Postural limitations    Postural Limitations  Rounded Shoulders;Forward head    Posture Comments  Postural awareness and strengthening exercises:  Standing tall as the wall:  with best upright posture through shoulders and neck, then at column with tall posture:  scapular squeezes x 15 reps, then neck retraction with towel behind head x 15reps       Self-Care   Self-Care  Other Self-Care Comments    Other Self-Care Comments   Discussed components of optimal fitness routine with  Parkinson's disease, provided patient information on [employer] and [employer] classes      Exercises   Exercises  Knee/Hip      Knee/Hip Exercises: Stretches   Active Hamstring Stretch  Right;Left;5 reps;10 seconds    Active Hamstring Stretch Limitations  At counter in standing with hip "hinge" posteriorly    Gastroc Stretch  Right;Left;3 reps;30 seconds    Gastroc Stretch Limitations  Runner's stretch at counter    Other Knee/Hip Stretches  Gastroc stretch with single foot propped on 2" block, 2 reps x 30 seconds    Other Knee/Hip Stretches  Reviewed stretches given last visit as part of HEP      Knee/Hip Exercises: Aerobic   Stepper  SciFit, Level 1.8, 4 extremities x 5 minutes for flexibility   cues for RPM >80-90         Balance Exercises - 04/21/18 1018      Balance Exercises: Standing   Standing Eyes Opened  Wide (BOA);Narrow base of support (BOS);Foam/compliant surface;Head turns;5 reps   Head nods   Standing Eyes Closed  Wide (BOA);Narrow base of support (BOS);Foam/compliant surface;Head turns;5 reps   Head nods   Wall Bumps  Hip    Wall Bumps-Hips  Eyes opened;Anterior/posterior;Foam/compliant surface;10 reps    Rockerboard  Anterior/posterior;Lateral;Head turns;EO;EC;10 seconds   Hip/ankle strategy work on rockerboard   Partial Tandem Stance  Eyes open;Eyes closed;Foam/compliant surface;Intermittent upper extremity support;5 reps   Head turns, head nods   Heel Raises Limitations  x 10 reps   on foam surface for ankle strategy   Toe Raise Limitations  x 10 reps   on compliant surface for ankle strategy   Other Standing Exercises  On rockerboard-forward step taps x 10 reps; on BOSU:  lateral weightshifting for limits of stability, anterior/posterior weightshifting for limits of stability with intermittent UE support.         PT Education - 04/21/18 1025    Education Details  Provided informaiton on [employer] and [employer] exercise classes    Person(s)  Educated  Patient    Methods  Explanation;Handout    Comprehension  Verbalized understanding          PT Long Term Goals - 03/31/18 0702      PT LONG TERM GOAL #1   Title  Pt will be independent with HEP for Parkinson's -specific exercises to improve functional mobility, transfers, and balance.  TARGET 04/29/18    Time  5    Period  Weeks    Status  New    Target Date  04/29/18      PT  LONG TERM GOAL #2   Title  Pt will perform at least 8 of 10 reps of sit<>stand transfers from surfaces <18" without UE support, independently, for improved functional strength with transfers.    Time  5    Period  Weeks    Status  New    Target Date  04/29/18      PT LONG TERM GOAL #3   Title  Pt will verbalize understanding of fall prevention education and local Parkinson's disease-related resources.    Time  5    Period  Weeks    Status  New    Target Date  04/29/18      PT LONG TERM GOAL #4   Title  Pt will ambulate at least 1000 ft over outdoor and unlevel surfaces, simulating golf course, with improved LUE arm swing and LLE step length (with cues less than 25% of the time)    Time  5    Period  Weeks    Status  New    Target Date  04/29/18            Plan - 04/21/18 1026    Clinical Impression Statement  Continued to focus on stretching, posture, and compliant surface balance exercises.  Pt appears to be incorporating exercises into home routine, but continues to report concern about forward flexed posture and how to maintain upright posture longer.  Will continue to address posture, balance, and gait work towards [place].    Rehab Potential  Good    PT Frequency  2x / week    PT Duration  4 weeks    PT Treatment/Interventions  ADLs/Self Care Home Management;Gait training;Functional mobility training;Therapeutic activities;Therapeutic exercise;Balance training;Patient/family education;Neuromuscular re-education    PT Next Visit Plan  Discuss walking program as part of HEP; continue  postural exercises, postural awareness and strengthening, balance on compliant surfaces    Consulted and Agree with Plan of Care  Patient       Patient will benefit from skilled therapeutic intervention in order to improve the following deficits and impairments:  Abnormal gait, Decreased balance, Decreased mobility, Difficulty walking, Decreased strength, Postural dysfunction  Visit Diagnosis: Abnormal posture  Other symptoms and signs involving the nervous system  Other abnormalities of gait and mobility     Problem List Patient Active Problem List   Diagnosis Date Noted  . Parkinson's disease ([place]) 04/13/2017  . BPH (benign prostatic hypertrophy) with urinary retention 09/30/2015    Tekla Malachowski W. 04/21/2018, 10:28 AM  Frazier Butt., PT   [place] [address] [place], [place], [address] Phone: [phone]   Fax:  [phone]  Name: Cruz Bong. MRN: [id number] Date of Birth: Dec 21, 1940

## 2018-04-25 ENCOUNTER — Encounter: Payer: Self-pay | Admitting: Physical Therapy

## 2018-04-25 ENCOUNTER — Ambulatory Visit: Payer: Medicare Other | Admitting: Physical Therapy

## 2018-04-25 DIAGNOSIS — R29818 Other symptoms and signs involving the nervous system: Secondary | ICD-10-CM

## 2018-04-25 DIAGNOSIS — R293 Abnormal posture: Secondary | ICD-10-CM

## 2018-04-25 NOTE — Therapy (Signed)
[place] [address] [place], [place], [address] Phone: [phone]   Fax:  [phone]  Physical Therapy Treatment  Patient Details  Name: Zachary Wang. MRN: [id number] Date of Birth: [birth year] Referring Provider: Alonza Bogus   Encounter Date: 04/25/2018  PT End of Session - 04/25/18 0904    Visit Number  7    Number of Visits  10    Date for PT Re-Evaluation  05/29/18    Authorization Type  UHC Medicare    Authorization Time Period  PT cert period 1/69/67-89/38/10    PT Start Time  0804    PT Stop Time  0845    PT Time Calculation (min)  41 min    Activity Tolerance  Patient tolerated treatment well    Behavior During Therapy  [facility] for tasks assessed/performed       Past Medical History:  Diagnosis Date  . Arthritis    hands  . BPH (benign prostatic hyperplasia)   . Elevated PSA   . Foley catheter in place   . History of adenomatous polyp of colon    2014  tubular adenoma  . History of malignant melanoma    s/p  excision left forearm and left axill lymph node bx 09-10-2004  . Hyperlipidemia   . Hypertension   . Peripheral vascular disease ([place])   . Urinary retention     Past Surgical History:  Procedure Laterality Date  . ANAL FISSURE REPAIR  1984  . COLONOSCOPY N/A 05/02/2013   Procedure: COLONOSCOPY;  Surgeon: Garlan Fair, MD;  Location: [facility];  Service: Endoscopy;  Laterality: N/A;  . INSERTION OF SUPRAPUBIC CATHETER N/A 09/30/2015   Procedure: INSERTION OF SUPRAPUBIC CATHETER;  Surgeon: Kathie Rhodes, MD;  Location: [facility];  Service: Urology;  Laterality: N/A;  . SHOULDER ARTHROSCOPY W/ SUBACROMIAL DECOMPRESSION AND DISTAL CLAVICLE EXCISION Left 05-20-2010   w/  Debridement , Bursectomy, Acromioplasty,  Capsulectomy  . TRANSURETHRAL RESECTION OF PROSTATE N/A 09/30/2015   Procedure: TURP (TRANSURETHRAL RESECTION OF PROSTATE WITH GYRUS;  Surgeon: Kathie Rhodes,  MD;  Location[facility];  Service: Urology;  Laterality: N/A;  . WIDE EXCISION MALIGNANT MELANOMA LEFT FOREARM/  LEFT AXILLA LYMPH NODE BX  09-10-2004    There were no vitals filed for this visit.  Subjective Assessment - 04/25/18 0804    Subjective  Went to a yoga class yesterday, and then played golf.  Enjoyed the yoga class, but it made me sore.    Patient Stated Goals  No specific goals, but work on things that seem to be out of the ordinary (in regards to Parkinson's disease).    Currently in Pain?  Yes    Pain Score  6     Pain Location  --   alll over   Pain Descriptors / Indicators  Sore    Pain Onset  More than a month ago    Pain Frequency  Intermittent    Aggravating Factors   worse since yoga class    Pain Relieving Factors  exercise and stretches                       [facility] Adult PT Treatment/Exercise - 04/25/18 0001      Self-Care   Self-Care  Other Self-Care Comments    Other Self-Care Comments   With patient reporting "weakness" in L hand and wrist (also has history of wrist limitations), and decreased  ability for fine motor and coordination tasks with LUE, PT discussed recommendation of OT to address UE.  Pt also asks about VA benefits/services for MD and for therapy (PT to contact our insurance person).  Answered patient's question regarding mattress and bed mobility (he currently has gel mattress that conforms to his body)-PT discussed benefit of firmer mattress, and trying to avoid gel or foam mattress top which makes bed mobility harder.        [facility]) - 04/25/18 0805    PWR! exercises  Moves in [place]! Up  x 15 reps    PWR! Rock  x 10 reps     PWR! Twist  x 10 reps    PWR! Step  x 10 reps    Comments  PWR! Moves in quadruped, hands on balance disks, for improved comfort of L wrist.  Cues for large amplitude, high intensity movement patterns.    PWR! Up  x 5 reps    PWR! Rock  x 5 reps wrist flexion and attempted  extension (to neutral due to hx of wrist limitations)    PWR! Twist  x 5 reps-limitations in pronation/supination due to hx of wrist limitations    PWR! Step  x 1 rep each finger -difficulty with bringing 5th finger to thumb       Balance Exercises - 04/25/18 0858      Balance Exercises: Standing   Other Standing Exercises  Standing on blue compliant mat surface on ramp incline/decline:  marching in place x 10, forward step taps alternating legs x 10, then partial tandem stance with head turns x 5, head nods x 5, with min guard assistance.  Sidestepping on blue mat surface on incline/decline x 3 reps each side.     Postural exercises in standing at column:  Scapular retraction, 2 sets x 10 reps, then chin tucks 2 sets x 10 reps with towel behind neck   PT Education - 04/25/18 0903    Education Details  Discussed benefits of occupational therapy to address LUE stiffness, coordination/fine motor; attempted to answer pt's questions regarding mattress recommendations for bed mobility     Person(s) Educated  Patient    Methods  Explanation    Comprehension  Verbalized understanding          PT Long Term Goals - 03/31/18 6599      PT LONG TERM GOAL #1   Title  Pt will be independent with HEP for Parkinson's -specific exercises to improve functional mobility, transfers, and balance.  TARGET 04/29/18    Time  5    Period  Weeks    Status  New    Target Date  04/29/18      PT LONG TERM GOAL #2   Title  Pt will perform at least 8 of 10 reps of sit<>stand transfers from surfaces <18" without UE support, independently, for improved functional strength with transfers.    Time  5    Period  Weeks    Status  New    Target Date  04/29/18      PT LONG TERM GOAL #3   Title  Pt will verbalize understanding of fall prevention education and local Parkinson's disease-related resources.    Time  5    Period  Weeks    Status  New    Target Date  04/29/18      PT LONG TERM GOAL #4   Title  Pt  will ambulate at least  1000 ft over outdoor and unlevel surfaces, simulating golf course, with improved LUE arm swing and LLE step length (with cues less than 25% of the time)    Time  5    Period  Weeks    Status  New    Target Date  04/29/18            Plan - 04/25/18 0904    Clinical Impression Statement  Skilled PT session focused on PWR! MOves in quadruped position to address posture, weightshifting, trunk flexibility, and stepping, in quadruped position.  Pt c/o discomfort in L wrist, and PT discussed benefit of occupational therapy to more fully address stiffness, bradykinesia in LUE.  Also addressed posture and compliant surface balance.  Will continue to address posture, balance, gait towards LTGs.    Rehab Potential  Good    PT Frequency  2x / week    PT Duration  4 weeks    PT Treatment/Interventions  ADLs/Self Care Home Management;Gait training;Functional mobility training;Therapeutic activities;Therapeutic exercise;Balance training;Patient/family education;Neuromuscular re-education    PT Next Visit Plan  Discuss walking program as part of HEP (did not do 04/25/18, as foggy, rainy outdoors); continue postural exercises, postural awareness and strengthening, balance on compliant surfaces    Recommended Other Services  Recommend OT services-PT to follow up with patient about his desire on this    Consulted and Agree with Plan of Care  Patient       Patient will benefit from skilled therapeutic intervention in order to improve the following deficits and impairments:  Abnormal gait, Decreased balance, Decreased mobility, Difficulty walking, Decreased strength, Postural dysfunction  Visit Diagnosis: Abnormal posture  Other symptoms and signs involving the nervous system     Problem List Patient Active Problem List   Diagnosis Date Noted  . Parkinson's disease ([place]) 04/13/2017  . BPH (benign prostatic hypertrophy) with urinary retention 09/30/2015    Maze Corniel  W. 04/25/2018, 9:08 AM  Frazier Butt., PT   [place] [address] [place], [place], [address] Phone: [phone]   Fax:  [phone]  Name: Zachary Wang. MRN: [id number] Date of Birth: 11/22/1940

## 2018-04-27 ENCOUNTER — Ambulatory Visit: Payer: Medicare Other | Admitting: Physical Therapy

## 2018-04-27 DIAGNOSIS — R293 Abnormal posture: Secondary | ICD-10-CM | POA: Diagnosis not present

## 2018-04-27 DIAGNOSIS — R2689 Other abnormalities of gait and mobility: Secondary | ICD-10-CM

## 2018-04-27 NOTE — Patient Instructions (Signed)
Provided handout for PWR! Moves in modified quadruped position at counter -20 reps each: -PWR! Up -PWR! [place]! Twist -PWR! Step

## 2018-04-27 NOTE — Therapy (Signed)
[place] [address] [place], [place], [address] Phone: [phone]   Fax:  [phone]  Physical Therapy Treatment  Patient Details  Name: Zachary Wang. MRN: [id number] Date of Birth: 1941/05/24 Referring Provider: Alonza Bogus   Encounter Date: 04/27/2018  PT End of Session - 04/27/18 0903    Visit Number  8    Number of Visits  10    Date for PT Re-Evaluation  05/29/18    Authorization Type  UHC Medicare    Authorization Time Period  PT cert period 3/38/25-05/39/76    PT Start Time  0810   PT late start due to traffice   PT Stop Time  0851    PT Time Calculation (min)  41 min    Activity Tolerance  Patient tolerated treatment well    Behavior During Therapy  [facility] for tasks assessed/performed       Past Medical History:  Diagnosis Date  . Arthritis    hands  . BPH (benign prostatic hyperplasia)   . Elevated PSA   . Foley catheter in place   . History of adenomatous polyp of colon    2014  tubular adenoma  . History of malignant melanoma    s/p  excision left forearm and left axill lymph node bx 09-10-2004  . Hyperlipidemia   . Hypertension   . Peripheral vascular disease ([place])   . Urinary retention     Past Surgical History:  Procedure Laterality Date  . ANAL FISSURE REPAIR  1984  . COLONOSCOPY N/A 05/02/2013   Procedure: COLONOSCOPY;  Surgeon: Garlan Fair, MD;  Location: [facility];  Service: Endoscopy;  Laterality: N/A;  . INSERTION OF SUPRAPUBIC CATHETER N/A 09/30/2015   Procedure: INSERTION OF SUPRAPUBIC CATHETER;  Surgeon: Kathie Rhodes, MD;  Location: [facility];  Service: Urology;  Laterality: N/A;  . SHOULDER ARTHROSCOPY W/ SUBACROMIAL DECOMPRESSION AND DISTAL CLAVICLE EXCISION Left 05-20-2010   w/  Debridement , Bursectomy, Acromioplasty,  Capsulectomy  . TRANSURETHRAL RESECTION OF PROSTATE N/A 09/30/2015   Procedure: TURP (TRANSURETHRAL RESECTION OF PROSTATE WITH  GYRUS;  Surgeon: Kathie Rhodes, MD;  [facility];  Service: Urology;  Laterality: N/A;  . WIDE EXCISION MALIGNANT MELANOMA LEFT FOREARM/  LEFT AXILLA LYMPH NODE BX  09-10-2004    There were no vitals filed for this visit.  Subjective Assessment - 04/27/18 0814    Subjective  No changes, no pain today.    Patient Stated Goals  No specific goals, but work on things that seem to be out of the ordinary (in regards to Parkinson's disease).    Currently in Pain?  No/denies    Pain Onset  More than a month ago                       [facility] Adult PT Treatment/Exercise - 04/27/18 0001      Ambulation/Gait   Ambulation/Gait  Yes    Ambulation/Gait Assistance  7: Independent    Ambulation Distance (Feet)  800 Feet    Assistive device  None   bilateral walking poles   Gait Pattern  Step-through pattern;Decreased arm swing - left;Decreased step length - left;Trunk flexed;Decreased trunk rotation    Ambulation Surface  Level;Indoor;Unlevel;Outdoor;Grass;Paved    Gait Comments  Cues for upright posture, cues for deliberate arm swing and step length on LUE and LLE; occasional postural cues for resetting to upright posture.  Attempted gait training with bilateral  walking poles to help facilitate more upright posture, arm swing and step length; pt needing frequent cues for resetting sequence, as he has a hard time sequencing reciprocal pattern.        [facility]) - 04/27/18 1478    PWR! exercises  Moves in sitting;Moves in [place];Moves in standing;Functional moves    PWR! Up  x 20    PWR! Rock  x 20    PWR! Twist  x 10 reps each side    PWR! Step  x 10 reps each side    Comments  PWR! Moves performed in modified quadruped position at counter    PWR! Up  x 20 reps    Comments  PWR! Up in standing for postural strengthening and awareness    PWR! Sit to Stand  Followed by PWR! Up x 10 reps for postural strengthening    PWR! Up  x 20    Comments  PWR! Up in  sitting, for improved postural strength and control          PT Education - 04/27/18 0903    Education Details  Added PWR! Moves modified quadruped position to HEP; discussed walking program-trying to walk 15-20 minutes with best posture, step length, arm swing, 2-3 time per week to start    Person(s) Educated  Patient    Methods  Explanation;Demonstration;Handout    Comprehension  Verbalized understanding;Returned demonstration          PT Long Term Goals - 03/31/18 2956      PT LONG TERM GOAL #1   Title  Pt will be independent with HEP for Parkinson's -specific exercises to improve functional mobility, transfers, and balance.  TARGET 04/29/18    Time  5    Period  Weeks    Status  New    Target Date  04/29/18      PT LONG TERM GOAL #2   Title  Pt will perform at least 8 of 10 reps of sit<>stand transfers from surfaces <18" without UE support, independently, for improved functional strength with transfers.    Time  5    Period  Weeks    Status  New    Target Date  04/29/18      PT LONG TERM GOAL #3   Title  Pt will verbalize understanding of fall prevention education and local Parkinson's disease-related resources.    Time  5    Period  Weeks    Status  New    Target Date  04/29/18      PT LONG TERM GOAL #4   Title  Pt will ambulate at least 1000 ft over outdoor and unlevel surfaces, simulating golf course, with improved LUE arm swing and LLE step length (with cues less than 25% of the time)    Time  5    Period  Weeks    Status  New    Target Date  04/29/18            Plan - 04/27/18 0904    Clinical Impression Statement  Skilled PT session focused on postural strengthening and awareness through PWR! Moves, particularly PWR! Up in varied positions.  Followed PWR! Moves by walking with attention to upright posture, arm swing, and step length on longer distance, outdoor surfaces.  With attempts at use of bilateral walking poles to facilitate improved posture,  arm swing, and step length, pt demonstrates decreased timing and coordination with L UE pole placement.  Pt will continue to benefit  from skilled PT to address posture, balance and gait.    Rehab Potential  Good    PT Frequency  2x / week    PT Duration  4 weeks    PT Treatment/Interventions  ADLs/Self Care Home Management;Gait training;Functional mobility training;Therapeutic activities;Therapeutic exercise;Balance training;Patient/family education;Neuromuscular re-education    PT Next Visit Plan  Follow-up on walking program as part of HEP; review PWR! Moves in modified quadruped; balance on compliant surfaces; begin checking LTGs-follow up on OT recommendation and d/c plans    Consulted and Agree with Plan of Care  Patient       Patient will benefit from skilled therapeutic intervention in order to improve the following deficits and impairments:  Abnormal gait, Decreased balance, Decreased mobility, Difficulty walking, Decreased strength, Postural dysfunction  Visit Diagnosis: Abnormal posture  Other abnormalities of gait and mobility     Problem List Patient Active Problem List   Diagnosis Date Noted  . Parkinson's disease ([place]) 04/13/2017  . BPH (benign prostatic hypertrophy) with urinary retention 09/30/2015    Qusai Kem W. 04/27/2018, 9:08 AM  Frazier Butt., PT   [place] [address] [place], [place], [address] Phone: [phone]   Fax:  [phone]  Name: Zachary Wang. MRN: [id number] Date of Birth: [birth year]

## 2018-05-02 ENCOUNTER — Encounter: Payer: Self-pay | Admitting: Physical Therapy

## 2018-05-02 ENCOUNTER — Ambulatory Visit: Payer: Medicare Other | Admitting: Physical Therapy

## 2018-05-02 DIAGNOSIS — R293 Abnormal posture: Secondary | ICD-10-CM | POA: Diagnosis not present

## 2018-05-02 DIAGNOSIS — R29818 Other symptoms and signs involving the nervous system: Secondary | ICD-10-CM

## 2018-05-02 DIAGNOSIS — R2689 Other abnormalities of gait and mobility: Secondary | ICD-10-CM

## 2018-05-02 NOTE — Therapy (Signed)
[place] [address] [place], [place], [address] Phone: [phone]   Fax:  [phone]  Physical Therapy Treatment  Patient Details  Name: Zachary Wang. MRN: [id number] Date of Birth: [birth year] Referring Provider: Alonza Bogus   Encounter Date: 05/02/2018  PT End of Session - 05/02/18 0857    Visit Number  9    Number of Visits  10    Date for PT Re-Evaluation  05/29/18    Authorization Type  UHC Medicare    Authorization Time Period  PT cert period 4/97/02-63/78/58    PT Start Time  0804    PT Stop Time  0846    PT Time Calculation (min)  42 min    Activity Tolerance  Patient tolerated treatment well    Behavior During Therapy  [facility] for tasks assessed/performed       Past Medical History:  Diagnosis Date  . Arthritis    hands  . BPH (benign prostatic hyperplasia)   . Elevated PSA   . Foley catheter in place   . History of adenomatous polyp of colon    2014  tubular adenoma  . History of malignant melanoma    s/p  excision left forearm and left axill lymph node bx 09-10-2004  . Hyperlipidemia   . Hypertension   . Peripheral vascular disease ([place])   . Urinary retention     Past Surgical History:  Procedure Laterality Date  . ANAL FISSURE REPAIR  1984  . COLONOSCOPY N/A 05/02/2013   Procedure: COLONOSCOPY;  Surgeon: Garlan Fair, MD;  [facility]: [facility];  Service: Endoscopy;  Laterality: N/A;  . INSERTION OF SUPRAPUBIC CATHETER N/A 09/30/2015   Procedure: INSERTION OF SUPRAPUBIC CATHETER;  Surgeon: Kathie Rhodes, MD;  [facility];  Service: Urology;  Laterality: N/A;  . SHOULDER ARTHROSCOPY W/ SUBACROMIAL DECOMPRESSION AND DISTAL CLAVICLE EXCISION Left 05-20-2010   w/  Debridement , Bursectomy, Acromioplasty,  Capsulectomy  . TRANSURETHRAL RESECTION OF PROSTATE N/A 09/30/2015   Procedure: TURP (TRANSURETHRAL RESECTION OF PROSTATE WITH GYRUS;  Surgeon: Kathie Rhodes,  MD;  Location: [facility];  Service: Urology;  Laterality: N/A;  . WIDE EXCISION MALIGNANT MELANOMA LEFT FOREARM/  LEFT AXILLA LYMPH NODE BX  09-10-2004    There were no vitals filed for this visit.  Subjective Assessment - 05/02/18 0805    Subjective  Tried the new exercises over the weekend.  No new changes since last visit.    Patient Stated Goals  No specific goals, but work on things that seem to be out of the ordinary (in regards to Parkinson's disease).    Currently in Pain?  No/denies    Pain Onset  More than a month ago                       [facility] Adult PT Treatment/Exercise - 05/02/18 0001      Transfers   Transfers  Sit to Stand;Stand to Sit    Sit to Stand  6: Modified independent (Device/Increase time);Without upper extremity assist;From chair/3-in-1    Stand to Sit  6: Modified independent (Device/Increase time);Without upper extremity assist;To chair/3-in-1    Number of Reps  Other sets (comment);Other reps (comment)   10 reps from 20" mat, 5 reps from 16", 10 reps from 14" soft     Ambulation/Gait   Ambulation/Gait  Yes    Ambulation/Gait Assistance  7: Independent    Ambulation Distance (Feet)  500 Feet    Assistive device  None    Gait Pattern  Step-through pattern;Decreased arm swing - left;Decreased step length - left;Trunk flexed;Decreased trunk rotation    Ambulation Surface  Level;Indoor        [facility]) - 05/02/18 3382    PWR! exercises  Moves in [place];Moves in standing    PWR! Up  x 20    PWR! Rock  x 20    PWR! Twist  x 10 reps each side    PWR! Step  x 10 reps each side    Comments  PWR! Moves in modified quadruped at [employer]! Up  x20 reps    PWR! Rock  x 10 reps each side    PWR! Twist  x 10 reps each side    PWR Step  x 10 reps each side    Comments  PWR! Up in standing on compliant mat surface    PWR! Sit to Stand  Followed by PWR! UP x 10 reps each time       Balance Exercises - 05/02/18 0854       Balance Exercises: Standing   Other Standing Exercises  Standing on blue compliant mat surface on ramp incline/decline:  marching in place x 10, forward step taps alternating legs x 10, then feet apart with head turns x 5, head nods x 5, EO/EC with supervision.  Sidestepping on blue mat surface on incline/decline x 3 reps each side.        PT Education - 05/02/18 0856    Education Details  Optimal PD fitness routine post rehab    Person(s) Educated  Patient    Methods  Explanation;Handout    Comprehension  Verbalized understanding          PT Long Term Goals - 05/02/18 0858      PT LONG TERM GOAL #1   Title  Pt will be independent with HEP for Parkinson's -specific exercises to improve functional mobility, transfers, and balance.  TARGET 04/29/18    Time  5    Period  Weeks    Status  Achieved      PT LONG TERM GOAL #2   Title  Pt will perform at least 8 of 10 reps of sit<>stand transfers from surfaces <18" without UE support, independently, for improved functional strength with transfers.    Time  5    Period  Weeks    Status  Achieved      PT LONG TERM GOAL #3   Title  Pt will verbalize understanding of fall prevention education and local Parkinson's disease-related resources.    Time  5    Period  Weeks    Status  New      PT LONG TERM GOAL #4   Title  Pt will ambulate at least 1000 ft over outdoor and unlevel surfaces, simulating golf course, with improved LUE arm swing and LLE step length (with cues less than 25% of the time)    Time  5    Period  Weeks    Status  New            Plan - 05/02/18 5053    Clinical Impression Statement  Pt has met LTG 1 and 2.  Educated patient on optimal fitness program post discharge from PT, which will likely be this week.  With review of HEP, pt does need cues for improved intensity of movement.     Rehab Potential  Good    PT Frequency  2x / week    PT Duration  4 weeks    PT Treatment/Interventions  ADLs/Self Care  Home Management;Gait training;Functional mobility training;Therapeutic activities;Therapeutic exercise;Balance training;Patient/family education;Neuromuscular re-education    PT Next Visit Plan  Check remaining goals, and plan for discharge.  Discuss return screen/eval.    Consulted and Agree with Plan of Care  Patient       Patient will benefit from skilled therapeutic intervention in order to improve the following deficits and impairments:  Abnormal gait, Decreased balance, Decreased mobility, Difficulty walking, Decreased strength, Postural dysfunction  Visit Diagnosis: Abnormal posture  Other symptoms and signs involving the nervous system  Other abnormalities of gait and mobility     Problem List Patient Active Problem List   Diagnosis Date Noted  . Parkinson's disease ([place]) 04/13/2017  . BPH (benign prostatic hypertrophy) with urinary retention 09/30/2015    Jolicia Delira W. 05/02/2018, 9:05 AM Frazier Butt., PT  [place] [address] [place], [place], [address] Phone: [phone]   Fax:  [phone]  Name: Hiroki Wint. MRN: [id number] Date of Birth: May 21, 1941

## 2018-05-02 NOTE — Patient Instructions (Signed)

## 2018-05-04 ENCOUNTER — Encounter: Payer: Self-pay | Admitting: Physical Therapy

## 2018-05-04 ENCOUNTER — Ambulatory Visit: Payer: Medicare Other | Admitting: Physical Therapy

## 2018-05-04 DIAGNOSIS — R293 Abnormal posture: Secondary | ICD-10-CM | POA: Diagnosis not present

## 2018-05-04 DIAGNOSIS — R2689 Other abnormalities of gait and mobility: Secondary | ICD-10-CM

## 2018-05-04 NOTE — Patient Instructions (Signed)

## 2018-05-04 NOTE — Therapy (Signed)
[facility] [address] [place], [place], [address] Phone: [phone]   Fax:  [phone]  Physical Therapy Treatment  Patient Details  Name: Zachary Wang. MRN: [id number] Date of Birth: Mar 30, 1941 Referring Provider: Alonza Bogus   Encounter Date: 05/04/2018  PT End of Session - 05/04/18 1209    Visit Number  10    Number of Visits  10    Date for PT Re-Evaluation  05/29/18    Authorization Type  [employer] Medicare    Authorization Time Period  PT cert period 7/35/32-99/24/26    PT Start Time  0846    PT Stop Time  0921   D/c session-goals checked and discharged   PT Time Calculation (min)  35 min    Activity Tolerance  Patient tolerated treatment well    Behavior During Therapy  [facility] for tasks assessed/performed       Past Medical History:  Diagnosis Date  . Arthritis    hands  . BPH (benign prostatic hyperplasia)   . Elevated PSA   . Foley catheter in place   . History of adenomatous polyp of colon    2014  tubular adenoma  . History of malignant melanoma    s/p  excision left forearm and left axill lymph node bx 09-10-2004  . Hyperlipidemia   . Hypertension   . Peripheral vascular disease ([place])   . Urinary retention     Past Surgical History:  Procedure Laterality Date  . ANAL FISSURE REPAIR  1984  . COLONOSCOPY N/A 05/02/2013   Procedure: COLONOSCOPY;  Surgeon: Garlan Fair, MD;  Location: [facility];  Service: Endoscopy;  Laterality: N/A;  . INSERTION OF SUPRAPUBIC CATHETER N/A 09/30/2015   Procedure: INSERTION OF SUPRAPUBIC CATHETER;  Surgeon: Kathie Rhodes, MD;  Location: [facility];  Service: Urology;  Laterality: N/A;  . SHOULDER ARTHROSCOPY W/ SUBACROMIAL DECOMPRESSION AND DISTAL CLAVICLE EXCISION Left 05-20-2010   w/  Debridement , Bursectomy, Acromioplasty,  Capsulectomy  . TRANSURETHRAL RESECTION OF PROSTATE N/A 09/30/2015   Procedure: TURP (TRANSURETHRAL RESECTION OF  PROSTATE WITH GYRUS;  Surgeon: Kathie Rhodes, MD;  Location: [facility];  Service: Urology;  Laterality: N/A;  . WIDE EXCISION MALIGNANT MELANOMA LEFT FOREARM/  LEFT AXILLA LYMPH NODE BX  09-10-2004    There were no vitals filed for this visit.  Subjective Assessment - 05/04/18 0847    Subjective  No changes, nothing new.      Patient Stated Goals  No specific goals, but work on things that seem to be out of the ordinary (in regards to Parkinson's disease).    Currently in Pain?  No/denies    Pain Onset  More than a month ago                       [facility] Adult PT Treatment/Exercise - 05/04/18 0848      Transfers   Transfers  Sit to Stand;Stand to Sit    Sit to Stand  6: Modified independent (Device/Increase time);Without upper extremity assist;From chair/3-in-1    Five time sit to stand comments   12.19    Stand to Sit  6: Modified independent (Device/Increase time);Without upper extremity assist;To chair/3-in-1      Ambulation/Gait   Ambulation/Gait  Yes    Ambulation/Gait Assistance  7: Independent    Ambulation Distance (Feet)  1000 Feet    Assistive device  None    Gait Pattern  Step-through  pattern;Trunk flexed    Ambulation Surface  Level;Indoor    Gait velocity  9 sec = 3.64 ft/sec      Standardized Balance Assessment   Standardized Balance Assessment  Timed Up and Go Test      Timed Up and Go Test   TUG  Normal TUG    Normal TUG (seconds)  8.19      Self-Care   Self-Care  Other Self-Care Comments    Other Self-Care Comments   Discussed progress towards goals, reviewed optimal fitness plan for post d/c with patient.  Reminded patient again about [employer] and pt reports he may observe-did not commit to it.  Discussed return visit in 6-9 months (screen versus eval) and agreed upon PT, OT, speech screens.  Provided and discussed fall prevention education.             PT Education - 05/04/18 1207     Education Details  Fall prevention, POC, discharge plans, including recommendation of return screens in 6-9 months    Person(s) Educated  Patient    Methods  Explanation;Handout    Comprehension  Verbalized understanding          PT Long Term Goals - 05/04/18 1209      PT LONG TERM GOAL #1   Title  Pt will be independent with HEP for Parkinson's -specific exercises to improve functional mobility, transfers, and balance.  TARGET 04/29/18    Time  5    Period  Weeks    Status  Achieved      PT LONG TERM GOAL #2   Title  Pt will perform at least 8 of 10 reps of sit<>stand transfers from surfaces <18" without UE support, independently, for improved functional strength with transfers.    Time  5    Period  Weeks    Status  Achieved      PT LONG TERM GOAL #3   Title  Pt will verbalize understanding of fall prevention education and local Parkinson's disease-related resources.    Time  5    Period  Weeks    Status  Achieved      PT LONG TERM GOAL #4   Title  Pt will ambulate at least 1000 ft over outdoor and unlevel surfaces, simulating golf course, with improved LUE arm swing and LLE step length (with cues less than 25% of the time)    Time  5    Period  Weeks    Status  Achieved            Plan - 05/04/18 1210    Clinical Impression Statement  Pt has met all LTGs.  Pt verbalized understanding of fall prevention as well as plans for optimal fitness upon d/c from PT.  Pt is appropriate for d/c at this time, and PT recommends return PT, OT, speech screens in 6-9 months.    Rehab Potential  Good    PT Frequency  2x / week    PT Duration  4 weeks    PT Treatment/Interventions  ADLs/Self Care Home Management;Gait training;Functional mobility training;Therapeutic activities;Therapeutic exercise;Balance training;Patient/family education;Neuromuscular re-education    PT Next Visit Plan  Discharge this visit; return screens in 6-9 months.    Consulted and Agree with Plan of Care   Patient       Patient will benefit from skilled therapeutic intervention in order to improve the following deficits and impairments:  Abnormal gait, Decreased balance, Decreased mobility, Difficulty walking,  Decreased strength, Postural dysfunction  Visit Diagnosis: Abnormal posture  Other abnormalities of gait and mobility     Problem List Patient Active Problem List   Diagnosis Date Noted  . Parkinson's disease ([place]) 04/13/2017  . BPH (benign prostatic hypertrophy) with urinary retention 09/30/2015    MARRIOTT,AMY W. 05/04/2018, 12:13 PM Frazier Butt., PT  [facility] [address] [place], [place], [address] Phone: [phone]   Fax:  [phone]  Name: Egor Fullilove. MRN: [id number] Date of Birth: 15-Jul-1941   PHYSICAL THERAPY DISCHARGE SUMMARY  Visits from Start of Care: 10  Current functional level related to goals / functional outcomes: PT Long Term Goals - 05/04/18 1209      PT LONG TERM GOAL #1   Title  Pt will be independent with HEP for Parkinson's -specific exercises to improve functional mobility, transfers, and balance.  TARGET 04/29/18    Time  5    Period  Weeks    Status  Achieved      PT LONG TERM GOAL #2   Title  Pt will perform at least 8 of 10 reps of sit<>stand transfers from surfaces <18" without UE support, independently, for improved functional strength with transfers.    Time  5    Period  Weeks    Status  Achieved      PT LONG TERM GOAL #3   Title  Pt will verbalize understanding of fall prevention education and local Parkinson's disease-related resources.    Time  5    Period  Weeks    Status  Achieved      PT LONG TERM GOAL #4   Title  Pt will ambulate at least 1000 ft over outdoor and unlevel surfaces, simulating golf course, with improved LUE arm swing and LLE step length (with cues less than 25% of the time)    Time  5    Period  Weeks    Status  Achieved       Pt has met all LTGs.   Remaining deficits: Posture, bradykinesia   Education / Equipment: Pt educated in HEP, fall prevention, optimal PD exercise post d/c.  Plan: Patient agrees to discharge.  Patient goals were met. Patient is being discharged due to meeting the stated rehab goals.  ?????Recommend return PT, OT, speech screens in 6-9 months due to progressive nature of disease process.    Mady Haagensen, PT 05/04/18 12:15 PM Phone: [phone] Fax: [phone]

## 2018-05-09 ENCOUNTER — Telehealth: Payer: Self-pay | Admitting: Neurology

## 2018-05-09 MED ORDER — CARBIDOPA-LEVODOPA 25-100 MG PO TABS: ORAL_TABLET | ORAL | 1 refills | Status: DC

## 2018-05-09 MED ORDER — PRAMIPEXOLE DIHYDROCHLORIDE ER 1.5 MG PO TB24: 1.0000 | ORAL_TABLET | Freq: Every day | ORAL | 1 refills | Status: DC

## 2018-05-09 NOTE — Telephone Encounter (Signed)
LMOM letting patient know RX written and at the front for pick up.

## 2018-05-09 NOTE — Telephone Encounter (Signed)
Patient called and would like a paper prescription so he can take to the [place]. He has been getting his medications through [facility]. The medications are Pramipexole and Sinemet. Thanks

## 2018-06-16 NOTE — Progress Notes (Signed)
Zachary Comp. was seen today in the movement disorders clinic for follow-up today.  He was newly diagnosed with Parkinson's disease last visit.  He was started on carbidopa/levodopa 25/100, 1 tablet 3 times per day.  The patient states that he still has tremor and he isn't sure that medication is helping.  Pt denies falls.  Pt denies lightheadedness, near syncope.  No hallucinations.  Mood has been good.  He is playing golf and doing some golf for exercise.  He is thinking about joining [employer] cycle class  10/15/17 update: Patient is seen today in follow-up.  Records have been reviewed since last visit.  He is on carbidopa/levodopa 25/100, 1 tablet 3 times per day and last visit pramipexole ER was started and worked up to 1.5 mg daily.  He denies compulsive behaviors.  Denies sleep attacks.  He reports that he is doing well.  Started [employer] cycle class since our last visit and he enjoys that.  He tried a zumba class and he found that the steps were fast paced for him but he liked it.  He is having some dizziness.  He is on 3 BP medications.  He has seen our Education officer, museum in regards to [place] benefits for Parkinson's disease.  02/15/18 update: Patient is seen today in follow-up for Parkinson's disease.  Patient is on carbidopa/levodopa 25/100, 1 tablet 3 times per day (8am/1pm/6pm).   Cannot tell when worn off.   Patient is also on pramipexole ER, 1.5 mg daily.  Patient has had no sleep attacks.  No compulsive behaviors.  No hallucinations.  No falls.  No lightheadedness or near syncope.  Doing [employer] cycle class and doing classes at [employer] fitness center.  Having trouble getting off of the couch without rocking.  06/20/18 update: Patient is seen today in follow-up for Parkinson's disease.  Medication was increased, so that he is now on carbidopa/levodopa 25/100, 2 tablets in the morning, 2 in the afternoon and 1 in the evening.  He is also on pramipexole ER, 1.5 mg daily.  No sleep attacks or compulsive  behaviors.  No hallucinations.  No falls.  No lightheadedness or near syncope.  Continues to exercise regularly - goes to [employer] class and does zumba and yoga as well.  Has attended physical therapy since our last visit and those notes are reviewed.  He recently had a "precancerous treatment" for his skin and in the process, he got a rash.  He has been back and had to go on prednisone.    PREVIOUS MEDICATIONS: none to date  ALLERGIES:  No Known Allergies  CURRENT MEDICATIONS:  Outpatient Encounter Medications as of 06/20/2018  Medication Sig  . amLODipine (NORVASC) 5 MG tablet Take 5 mg by mouth every morning.  Marland Kitchen atorvastatin (LIPITOR) 20 MG tablet Take 20 mg by mouth every evening.   . carbidopa-levodopa (SINEMET IR) 25-100 MG tablet 2 in the AM, 2 in the afternoon, 1 in the evening  . doxycycline (VIBRAMYCIN) 100 MG capsule Take 100 mg by mouth 2 (two) times daily.  . hydrochlorothiazide (HYDRODIURIL) 25 MG tablet Take 25 mg by mouth every morning.   Marland Kitchen losartan (COZAAR) 100 MG tablet Take 50 mg by mouth 2 (two) times daily.  . Pramipexole Dihydrochloride 1.5 MG TB24 Take 1 tablet (1.5 mg total) by mouth daily.  Marland Kitchen triamcinolone cream (KENALOG) 0.1 % Apply 1 application topically 2 (two) times daily.   No facility-administered encounter medications on file as of 06/20/2018.  PAST MEDICAL HISTORY:   Past Medical History:  Diagnosis Date  . Arthritis    hands  . BPH (benign prostatic hyperplasia)   . Elevated PSA   . Foley catheter in place   . History of adenomatous polyp of colon    2014  tubular adenoma  . History of malignant melanoma    s/p  excision left forearm and left axill lymph node bx 09-10-2004  . Hyperlipidemia   . Hypertension   . Peripheral vascular disease ([place])   . Urinary retention     PAST SURGICAL HISTORY:   Past Surgical History:  Procedure Laterality Date  . ANAL FISSURE REPAIR  1984  . COLONOSCOPY N/A 05/02/2013   Procedure: COLONOSCOPY;   Surgeon: Garlan Fair, MD;  Location: [facility];  Service: Endoscopy;  Laterality: N/A;  . INSERTION OF SUPRAPUBIC CATHETER N/A 09/30/2015   Procedure: INSERTION OF SUPRAPUBIC CATHETER;  Surgeon: Kathie Rhodes, MD;  Location: [facility];  Service: Urology;  Laterality: N/A;  . SHOULDER ARTHROSCOPY W/ SUBACROMIAL DECOMPRESSION AND DISTAL CLAVICLE EXCISION Left 05-20-2010   w/  Debridement , Bursectomy, Acromioplasty,  Capsulectomy  . TRANSURETHRAL RESECTION OF PROSTATE N/A 09/30/2015   Procedure: TURP (TRANSURETHRAL RESECTION OF PROSTATE WITH GYRUS;  Surgeon: Kathie Rhodes, MD;  Location: [facility];  Service: Urology;  Laterality: N/A;  . WIDE EXCISION MALIGNANT MELANOMA LEFT FOREARM/  LEFT AXILLA LYMPH NODE BX  09-10-2004    SOCIAL HISTORY:   Social History   Socioeconomic History  . Marital status: Married    Spouse name: Not on file  . Number of children: Not on file  . Years of education: Not on file  . Highest education level: Not on file  Occupational History    Comment: Therapist, sports  Social Needs  . Financial resource strain: Not on file  . Food insecurity:    Worry: Not on file    Inability: Not on file  . Transportation needs:    Medical: Not on file    Non-medical: Not on file  Tobacco Use  . Smoking status: Never Smoker  . Smokeless tobacco: Never Used  Substance and Sexual Activity  . Alcohol use: Yes    Comment: occasional  . Drug use: No  . Sexual activity: Not on file  Lifestyle  . Physical activity:    Days per week: Not on file    Minutes per session: Not on file  . Stress: Not on file  Relationships  . Social connections:    Talks on phone: Not on file    Gets together: Not on file    Attends religious service: Not on file    Active member of club or organization: Not on file    Attends meetings of clubs or organizations: Not on file    Relationship status: Not on file  . Intimate partner violence:     Fear of current or ex partner: Not on file    Emotionally abused: Not on file    Physically abused: Not on file    Forced sexual activity: Not on file  Other Topics Concern  . Not on file  Social History Narrative  . Not on file    FAMILY HISTORY:   Family Status  Relation Name Status  . Mother  Deceased  . Father  Deceased  . Sister x2 Deceased  . Child x3 Alive    ROS: Review of Systems  Constitutional: Negative.   HENT: Negative.  Eyes: Negative.   Respiratory: Negative.   Cardiovascular: Negative.   Musculoskeletal: Negative.   Skin: Negative.     PHYSICAL EXAMINATION:    VITALS:   Vitals:   06/20/18 0801  BP: 104/66  Pulse: 64  SpO2: 93%  Weight: 223 lb (101.2 kg)  Height: 6\' 2"  (1.88 m)    GEN:  The patient appears stated age and is in NAD. HEENT:  Normocephalic, atraumatic.  The mucous membranes are moist. The superficial temporal arteries are without ropiness or tenderness. CV:  RRR Lungs:  CTAB Neck/HEME:  There are no carotid bruits bilaterally.  Neurological examination:  Orientation: The patient is alert and oriented x3. Cranial nerves: There is good facial symmetry. The speech is fluent and clear. Soft palate rises symmetrically and there is no tongue deviation. Hearing is intact to conversational tone. Sensation: Sensation is intact to light touch throughout Motor: Strength is 5/5 in the bilateral upper and lower extremities.   Shoulder shrug is equal and symmetric.  There is no pronator drift.  Movement examination: Tone: There is mild increased tone in the bilateral UE, L more than right Abnormal movements: There is minimal tremor in the LUE Coordination:  There is decremation, with any form of RAMS, including alternating supination and pronation of the forearm, hand opening and closing, finger taps, heel taps and toe taps on the L.   Gait and Station: The patient has no difficulty arising out of a deep-seated chair without the use of the  hands. The patient's stride length is good.     ASSESSMENT/PLAN:  1.  Parkinson's disease, tremor predominant.  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  The patient has tremor, bradykinesia, rigidity  -We discussed the diagnosis as well as pathophysiology of the disease.  We discussed treatment options as well as prognostic indicators.  Patient education was provided.  -We discussed that it used to be thought that levodopa would increase risk of melanoma but now it is believed that Parkinsons itself likely increases risk of melanoma. he is to get regular skin checks.  -continue carbidopa/levodopa 25/100, 2 at 7am/2 at 11am/1 at 4pm  -Continue pramipexole ER, 1.5 mg daily.   -he finished his process for disability via the [place] [facility].  He is getting his meds via the [place] now.  2.  History of malignant melanoma  -He is following regularly with dermatology.  3.  Dizziness  -Primary care and I are monitoring meds closely.  He is on 3 different meds.  States that he just saw PCP and he was told to continue these meds  4.  Follow up is anticipated in the next few months, sooner should new neurologic issues arise.  Much greater than 50% of this visit was spent in counseling and coordinating care.  Total face to face time:  30 min    Cc:  Avva, Ravisankar, MD

## 2018-06-20 ENCOUNTER — Ambulatory Visit (INDEPENDENT_AMBULATORY_CARE_PROVIDER_SITE_OTHER): Payer: Medicare Other | Admitting: Neurology

## 2018-06-20 ENCOUNTER — Encounter: Payer: Self-pay | Admitting: Neurology

## 2018-06-20 VITALS — BP 104/66 | HR 64 | Ht 74.0 in | Wt 223.0 lb

## 2018-06-20 DIAGNOSIS — Z85820 Personal history of malignant melanoma of skin: Secondary | ICD-10-CM

## 2018-06-20 DIAGNOSIS — G20 Parkinson's disease: Secondary | ICD-10-CM

## 2018-06-20 NOTE — Patient Instructions (Signed)
Support & Resources  You are not alone. Being diagnosed with Parkinson's disease can be an emotional diagnosis, and one that impacts your whole family. Our goal is to make sure you're getting support not just while you're at [facility], but in the days between appointments. Our social worker will work with both patients and their support systems to help you cope with the changes your diagnosis brings to your everyday life. You will learn how to recognize and adjust to new needs, receive information about coping skills related to disease progression, receive counseling and be plugged into community resources and other areas of support. You can contact our social worker, Jessica S. Thomas, for resources and support at [phone].   [facility] Parkinson's Community Support Group This group meets every third Tuesday from 4:00 p.m. - 5:00 p.m. at the [facility]. This group provides information about how Parkinson's disease affects you as an individual, how to proactively take control of Parkinson's disease, and steps to manage your Parkinson's disease, education, and information about exercise for lifelong activity.  Other Local Support Groups: (please call group leader to confirm meeting location) City Monthly Meeting Day/Time Location Group Leader  [place]  1st Tuesday  (no June/July mtg) 10:30 am                                                                  [address], [place], [place] [address] Annette Caughron                            [phone]                                    [email or web address]         [place]  2nd Tuesday   10:30 am  [address]      [place], [place] [address] Bill Amidon                                         [phone]                         [email or web address]   [place]        [place]) 1st Thursday     10:30 am [address] [place], [place] [address] Katie Page                                          [phone]   [place]- early onset  Varies  [employer]  Tim Hudson                                      [email or web address]  [place]  3rd Monday     2pm [address], [place],  [address] Jessica Thomas, LCSW                        [email or web address]  [place] 2nd Thursday          10am [address] [place], [place] [address] Kathy Coolidge                      [phone]                       [email or web address]       [place]  3rd Wednesday      7pm  [address] [place], [place] [address] [phone]          

## 2018-11-21 ENCOUNTER — Ambulatory Visit: Payer: Medicare Other | Admitting: Neurology

## 2018-11-24 ENCOUNTER — Ambulatory Visit: Payer: Medicare Other

## 2018-11-24 ENCOUNTER — Ambulatory Visit: Payer: Medicare Other | Admitting: Occupational Therapy

## 2018-11-24 ENCOUNTER — Other Ambulatory Visit (HOSPITAL_COMMUNITY): Payer: Self-pay | Admitting: Internal Medicine

## 2018-11-24 ENCOUNTER — Ambulatory Visit (HOSPITAL_COMMUNITY)
Admission: RE | Admit: 2018-11-24 | Discharge: 2018-11-24 | Disposition: A | Discharge status: Home / Self Care | Payer: Medicare Other | Source: Ambulatory Visit | Attending: Family | Admitting: Family

## 2018-11-24 ENCOUNTER — Other Ambulatory Visit: Payer: Self-pay

## 2018-11-24 ENCOUNTER — Ambulatory Visit: Payer: Medicare Other | Admitting: Physical Therapy

## 2018-11-24 DIAGNOSIS — R600 Localized edema: Secondary | ICD-10-CM

## 2018-12-05 ENCOUNTER — Telehealth: Payer: Self-pay | Admitting: Neurology

## 2018-12-05 NOTE — Telephone Encounter (Signed)
Got a tele-visit note from the patient's primary care physician.  Noted that the patient told the primary care physician that his visit was canceled due to the pandemic.  It appears we have left messages with the patient to reschedule an E visit.  I see he does have a visit scheduled within a month.  Hinton Dyer, if he wants to schedule before that time, he is more than welcome to schedule an E visit.

## 2018-12-05 NOTE — Telephone Encounter (Signed)
Lab work is received from primary care physician and dated November 29, 2018.  Sodium is 142, potassium 3.7, chloride 107, CO2 25, BUN 25, creatinine 1.3.  White blood cells are 5.3, hemoglobin 13.7, hematocrit 91.3 and platelets 155.

## 2018-12-06 NOTE — Telephone Encounter (Signed)
I will call patient and see what I can find out for you and let you know.   Thank you  Hinton Dyer

## 2018-12-06 NOTE — Progress Notes (Signed)
Virtual Visit via Video Note The purpose of this virtual visit is to provide medical care while limiting exposure to the novel coronavirus.    Consent was obtained for video visit:  Yes.   Answered questions that patient had about telehealth interaction:  Yes.   I discussed the limitations, risks, security and privacy concerns of performing an evaluation and management service by telemedicine. I also discussed with the patient that there may be a patient responsible charge related to this service. The patient expressed understanding and agreed to proceed.  Pt location: Home Physician Location: office Name of referring provider:  Prince Solian, MD I connected with Zachary Wang. at patients initiation/request on 12/08/2018 at  9:30 AM EDT by video enabled telemedicine application and verified that I am speaking with the correct person using two identifiers. Pt MRN:  [id number] Pt DOB:  1940-09-19 Video Participants:  Zachary Wang.;     History of Present Illness:  Patient is seen today in follow-up for Parkinson's disease.  He is supposed on carbidopa/levodopa 25/100, 2 tablets at 7 AM, 2 tablets at 11 AM, and 1 tablet at 4 PM, but admits that the timing he takes them is way off and may miss the middle of the day dose or take it late and takes the 4p.m. dose at bedtime.  He is having some cramping but didn't realize that was associated with the disease.   He is on pramipexole ER, 1.5 mg daily.  There have been no compulsive behaviors.  No sleep attacks.  No hallucinations.  No falls.  Not able to exercise much due to covid except for golf course.  He was doing tai chi and yoga.  He is doing walking for exercise.  He has noticed complete loss of smell which has been frustrating.  He has noticed drooling since last visit.  It has not been bad, but has started since last visit.  He also notices erectile dysfunction.  He used to see [facility], but they released him about a year ago.   They never discussed erectile dysfunction.  Medical records have been reviewed since last visit.   Observations/Objective:   Vitals:   12/08/18 0857  BP: (!) 115/58  Pulse: 86  Weight: 219 lb (99.3 kg)  Height: '6\' 2"'$  (1.88 m)   GEN:  The patient appears stated age and is in NAD.  Neurological examination:  Orientation: The patient is alert and oriented x3. Cranial nerves: There is good facial symmetry. There is mild facial hypomimia.  The speech is fluent and clear. Soft palate rises symmetrically and there is no tongue deviation. Hearing is intact to conversational tone. Motor: Strength is at least antigravity x 4.   Shoulder shrug is equal and symmetric.  There is no pronator drift.  Movement examination: Tone: unable Abnormal movements: none Coordination:  There is  decremation with RAM's, with finger taps, left more than right.  Was unable to see the feet well to assess heel and toe taps. Gait and Station:  The patient's stride length is slightly decreased with decreased arm swing on the left.  He does not shuffle.  He has a slightly stooped posture.    Lab work is received from primary care physician and dated November 29, 2018.  Sodium is 142, potassium 3.7, chloride 107, CO2 25, BUN 25, creatinine 1.3.  White blood cells are 5.3, hemoglobin 13.7, hematocrit 91.3 and platelets 155.  Assessment and Plan:   1.  Parkinson's disease,  tremor predominant.  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  The patient has tremor, bradykinesia, rigidity             -We discussed the diagnosis as well as pathophysiology of the disease.  We discussed treatment options as well as prognostic indicators.  Patient education was provided.             -We discussed that it used to be thought that levodopa would increase risk of melanoma but now it is believed that Parkinsons itself likely increases risk of melanoma. he is to get regular skin checks.             -continue carbidopa/levodopa 25/100, but we did talk about the  importance of timing of dosage, primarily because he is having some cramping, which is likely associated with need for dopamine.  He is to take carbidopa/levodopa 25/100, 2 at 7am/2 at 11am/1 at 4pm             -Continue pramipexole ER, 1.5 mg daily.              -he finished his process for disability via the [place] [facility].  He is getting his meds via the [place] now.  He will take his medication prescriptions up at my office for my signature.  2.  History of malignant melanoma             -He is following regularly with dermatology.  3.  Dizziness             -Primary care and I are monitoring meds closely.  He is on multiple different blood pressure medications.  His blood pressure was somewhat low today, but he states that his dizziness/lightheadedness is better than it was.  4.  Sialorrhea  -This is commonly associated with PD.  We talked about treatments.  The patient is not a candidate for oral anticholinergic therapy because of increased risk of confusion and falls.  We discussed Botox (type A and B) and 1% atropine drops.  We discusssed that candy like lemon drops can help by stimulating mm of the oropharynx to induce swallowing.  He does not think that sialorrhea has been made for Botox, and is going to try the lemon drop candy.  5.  Erectile dysfunction  -Has seen [facility] in the past, but not for erectile dysfunction.  Offered to refer him back, but he wishes to hold on that for now.  Told him to let me know if he changes his mind.  Follow Up Instructions: F/u in 4-5 months    -I discussed the assessment and treatment plan with the patient. The patient was provided an opportunity to ask questions and all were answered. The patient agreed with the plan and demonstrated an understanding of the instructions.   The patient was advised to call back or seek an in-person evaluation if the symptoms worsen or if the condition fails to improve as anticipated.    Total Time spent  in visit with the patient was:  25 min, of which more than 50% of the time was spent in counseling and/or coordinating care on safety.   Pt understands and agrees with the plan of care outlined.     Alonza Bogus, DO

## 2018-12-07 ENCOUNTER — Encounter: Payer: Self-pay | Admitting: General Surgery

## 2018-12-08 ENCOUNTER — Encounter: Payer: Self-pay | Admitting: Neurology

## 2018-12-08 ENCOUNTER — Telehealth (INDEPENDENT_AMBULATORY_CARE_PROVIDER_SITE_OTHER): Payer: Medicare Other | Admitting: Neurology

## 2018-12-08 ENCOUNTER — Other Ambulatory Visit: Payer: Self-pay | Admitting: General Surgery

## 2018-12-08 ENCOUNTER — Other Ambulatory Visit: Payer: Self-pay

## 2018-12-08 DIAGNOSIS — N529 Male erectile dysfunction, unspecified: Secondary | ICD-10-CM | POA: Diagnosis not present

## 2018-12-08 DIAGNOSIS — K117 Disturbances of salivary secretion: Secondary | ICD-10-CM

## 2018-12-08 DIAGNOSIS — G20 Parkinson's disease: Secondary | ICD-10-CM | POA: Diagnosis not present

## 2018-12-08 MED ORDER — PRAMIPEXOLE DIHYDROCHLORIDE ER 1.5 MG PO TB24: 1.0000 | ORAL_TABLET | ORAL | 3 refills | Status: DC

## 2018-12-08 MED ORDER — CARBIDOPA-LEVODOPA 25-100 MG PO TABS: ORAL_TABLET | ORAL | 3 refills | Status: DC

## 2018-12-16 ENCOUNTER — Other Ambulatory Visit: Payer: Self-pay

## 2018-12-16 ENCOUNTER — Ambulatory Visit
Admission: RE | Admit: 2018-12-16 | Discharge: 2018-12-16 | Disposition: A | Discharge status: Home / Self Care | Payer: Medicare Other | Source: Ambulatory Visit | Attending: Adult Health | Admitting: Adult Health

## 2018-12-16 ENCOUNTER — Other Ambulatory Visit: Payer: Self-pay | Admitting: Adult Health

## 2018-12-16 DIAGNOSIS — M25571 Pain in right ankle and joints of right foot: Secondary | ICD-10-CM

## 2018-12-16 DIAGNOSIS — M79671 Pain in right foot: Secondary | ICD-10-CM

## 2019-01-03 ENCOUNTER — Ambulatory Visit: Payer: Non-veteran care | Admitting: Neurology

## 2019-01-12 ENCOUNTER — Other Ambulatory Visit: Payer: Self-pay

## 2019-01-12 ENCOUNTER — Telehealth: Payer: Self-pay | Admitting: Occupational Therapy

## 2019-01-12 ENCOUNTER — Ambulatory Visit: Payer: Medicare Other | Admitting: Occupational Therapy

## 2019-01-12 ENCOUNTER — Ambulatory Visit: Payer: Medicare Other | Attending: Internal Medicine | Admitting: Physical Therapy

## 2019-01-12 DIAGNOSIS — R29818 Other symptoms and signs involving the nervous system: Secondary | ICD-10-CM

## 2019-01-12 DIAGNOSIS — R278 Other lack of coordination: Secondary | ICD-10-CM | POA: Insufficient documentation

## 2019-01-12 DIAGNOSIS — G20A1 Parkinson's disease without dyskinesia, without mention of fluctuations: Secondary | ICD-10-CM

## 2019-01-12 DIAGNOSIS — R2689 Other abnormalities of gait and mobility: Secondary | ICD-10-CM | POA: Insufficient documentation

## 2019-01-12 DIAGNOSIS — G20 Parkinson's disease: Secondary | ICD-10-CM

## 2019-01-12 NOTE — Therapy (Signed)
[facility] [address] [place], [place], [address] Phone: [phone]   Fax:  [phone]  Patient Details  Name: Zachary Wang. MRN: [id number] Date of Birth: August 30, 1940 Referring Provider:  Prince Solian, MD  Encounter Date: 01/12/2019  Occupational Therapy Parkinson's Disease Screen  Hand dominance:  right   Physical Performance Test item #4 (donning/doffing jacket):  11.75 sec  Fastening/unfastening 3 buttons in:  33.03sec.    9-hole peg test:    RUE  24.91 sec        LUE  37.72 sec  Box & Blocks Test:   RUE  46 blocks        LUE  47 blocks  Change in ability to perform ADLs/IADLs:  Difficulty writing, difficulty fastening buttons, difficulty getting items in/out of pockets, difficulty eating with bigger sandwiches/salads.    Pt would benefit from occupational therapy evaluation due to  Slowed coordination and difficulty with ADLs, establish PD-specific HEP.      Gi Or Norman 01/12/2019, 8:35 AM  [facility] [address] [place], [place], [address] Phone: [phone]   Fax:  [place], OTR/L [facility] [address] [place], [place]  [address] [phone] phone [phone] 01/12/19 8:35 AM

## 2019-01-12 NOTE — Telephone Encounter (Signed)
This was sent directly to you. But noted This referral has been placed

## 2019-01-12 NOTE — Telephone Encounter (Signed)
Dr. Carles Collet,   Mr. Keenum was seen for Parkinsons OT and PT screens 01/12/19.   Pt may benefit from OT referral due to slowed coordination, difficulty with ADLs, and to establish PD-specific HEP. If you are in agreement, please send updated order for Occupational therapy via epic.  Thank you,   Vianne Bulls, OTR/L [facility] [address] [place], [place]  [address] [phone] phone [phone] 01/12/19 9:43 AM

## 2019-01-12 NOTE — Therapy (Signed)
[facility] [address] [place], [place], [address] Phone: [phone]   Fax:  [phone]  Patient Details  Name: Zachary Wang. MRN: [id number] Date of Birth: 1941-01-22 Referring Provider:  Prince Solian, MD  Encounter Date: 01/12/2019   Physical Therapy Parkinson's Disease Screen   Timed Up and Go test: 7.88 sec  10 meter walk test:8.43 sec = 3.89 ft/sec  5 time sit to stand test: 11.62 sec   Patient does not require Physical Therapy services at this time.  Recommend Physical Therapy screen in 6-9 months.  Asked speech therapy screen-related questions, pt denies any coughing with eating/drinking and denies decreased voice volume (people do not ask him to repeat himself with conversation).         [name] W. 01/12/2019, 8:48 AM  Mady Haagensen, PT 01/12/19 9:07 AM Phone: [phone] Fax: [facility] [address] [place], [place], [address] Phone: [phone]   Fax:  [phone]

## 2019-01-12 NOTE — Telephone Encounter (Signed)
[employer], whenever they send these to you, just go ahead and automattically put in those orders that they request.  Thanks!

## 2019-01-17 ENCOUNTER — Telehealth: Payer: Self-pay | Admitting: Neurology

## 2019-01-17 NOTE — Telephone Encounter (Signed)
Needs [employer] letter from 09/08/18 sent to Vianne Bulls at [facility] Will contact Levada Dy as well to inform her of this. Letter printed

## 2019-01-17 NOTE — Telephone Encounter (Signed)
Pt request copy of referral that was made on 01/12/19 [facility] be mailed to him. Copy printed/ mailed

## 2019-01-17 NOTE — Telephone Encounter (Signed)
Pt states he needs authorization outside [place] billing letter  Requesting that letter be sent over to [facility] Levada Dy  [id number]

## 2019-01-17 NOTE — Telephone Encounter (Signed)
Called spoke with Levada Dy at [facility] she states that patient would need to contact [facility] to have letter generated for billing for PT. The letter we have is only for Dr. Carles Collet services not PT he needs a seprate letter for [facility].  Will call patient to make him aware that in order for PT services to be paid for by [employer] he would need to contact [facility] to create letter.

## 2019-01-17 NOTE — Telephone Encounter (Signed)
Pt aware and understand He will contact [facility] for [employer] letter for PT

## 2019-01-17 NOTE — Telephone Encounter (Signed)
Patient needs to have his letter with the auth from the [place] sent to the rehab center we sent him to. He states that he had  Rehab with Levada Dy. He states you can callhim and he can explain it to you

## 2019-01-19 ENCOUNTER — Telehealth: Payer: Self-pay | Admitting: Neurology

## 2019-01-19 NOTE — Telephone Encounter (Signed)
Dolores Lory Nurse Manager # [phone]  phone [phone]

## 2019-01-19 NOTE — Telephone Encounter (Signed)
Received NON-VA CARE REFERRAL from [facility] for completion  Form completed attached last office note fax back.  ~Dr. Tat  Please be aware

## 2019-01-19 NOTE — Telephone Encounter (Signed)
Called spoke with Maylon Cos he will be sending over a form that is request for services for patient PT pleae attach office note with form then fax back to [facility] at [phone] Once form is received I will have Dr. Carles Collet sign and send back

## 2019-01-19 NOTE — Telephone Encounter (Signed)
Left message with after hour service on 01-19-19 @ 12:29    Caller states [facility] needing request for PT    Would like a call from the office [phone] ext [phone]

## 2019-01-20 NOTE — Telephone Encounter (Signed)
Called patient back he is aware that everything required from our office has been sent back to the [place]. Pt aware and understands

## 2019-01-20 NOTE — Telephone Encounter (Signed)
Pt would like to speak with you regarding referral for PT, pls call him at [phone].

## 2019-02-13 ENCOUNTER — Ambulatory Visit: Payer: Medicare Other | Attending: Internal Medicine | Admitting: Occupational Therapy

## 2019-02-13 ENCOUNTER — Other Ambulatory Visit: Payer: Self-pay

## 2019-02-13 ENCOUNTER — Encounter: Payer: Self-pay | Admitting: Occupational Therapy

## 2019-02-13 DIAGNOSIS — R29818 Other symptoms and signs involving the nervous system: Secondary | ICD-10-CM | POA: Diagnosis not present

## 2019-02-13 DIAGNOSIS — R278 Other lack of coordination: Secondary | ICD-10-CM

## 2019-02-13 DIAGNOSIS — R293 Abnormal posture: Secondary | ICD-10-CM | POA: Diagnosis present

## 2019-02-13 DIAGNOSIS — M25611 Stiffness of right shoulder, not elsewhere classified: Secondary | ICD-10-CM | POA: Diagnosis present

## 2019-02-13 DIAGNOSIS — M25612 Stiffness of left shoulder, not elsewhere classified: Secondary | ICD-10-CM

## 2019-02-13 DIAGNOSIS — R29898 Other symptoms and signs involving the musculoskeletal system: Secondary | ICD-10-CM

## 2019-02-13 DIAGNOSIS — R2689 Other abnormalities of gait and mobility: Secondary | ICD-10-CM

## 2019-02-13 DIAGNOSIS — R251 Tremor, unspecified: Secondary | ICD-10-CM

## 2019-02-13 NOTE — Therapy (Signed)
[facility] [address] [place], [place], [address] Phone: [phone]   Fax:  [phone]  Occupational Therapy Evaluation  Patient Details  Name: Zachary Wang. MRN: [id number] Date of Birth: Jul 12, 1941 Referring Provider (OT): Dr. Wells Guiles Tat   Encounter Date: 02/13/2019  OT End of Session - 02/13/19 1435    Visit Number  1    Number of Visits  13    Date for OT Re-Evaluation  03/30/19    Authorization Type  [employer] Medicare 20% aived 5/11-9/30/20 (VA approved OT for up to 15 visits including eval)    Authorization Time Period  Medicare cert. period 02/13/19-05/14/19    Authorization - Visit Number  1   Medicare PN visits   Authorization - Number of Visits  10    OT Start Time  1302    OT Stop Time  1355    OT Time Calculation (min)  53 min    Activity Tolerance  Patient tolerated treatment well    Behavior During Therapy  WFL for tasks assessed/performed       Past Medical History:  Diagnosis Date  . Arthritis    hands  . BPH (benign prostatic hyperplasia)   . Elevated PSA   . Foley catheter in place   . History of adenomatous polyp of colon    2014  tubular adenoma  . History of malignant melanoma    s/p  excision left forearm and left axill lymph node bx 09-10-2004  . Hyperlipidemia   . Hypertension   . Peripheral vascular disease ([place])   . Urinary retention     Past Surgical History:  Procedure Laterality Date  . ANAL FISSURE REPAIR  1984  . COLONOSCOPY N/A 05/02/2013   Procedure: COLONOSCOPY;  Surgeon: Garlan Fair, MD;  Location: [facility];  Service: Endoscopy;  Laterality: N/A;  . INSERTION OF SUPRAPUBIC CATHETER N/A 09/30/2015   Procedure: INSERTION OF SUPRAPUBIC CATHETER;  Surgeon: Kathie Rhodes, MD;  Location: [facility];  Service: Urology;  Laterality: N/A;  . SHOULDER ARTHROSCOPY W/ SUBACROMIAL DECOMPRESSION AND DISTAL CLAVICLE EXCISION Left 05-20-2010   w/   Debridement , Bursectomy, Acromioplasty,  Capsulectomy  . TRANSURETHRAL RESECTION OF PROSTATE N/A 09/30/2015   Procedure: TURP (TRANSURETHRAL RESECTION OF PROSTATE WITH GYRUS;  Surgeon: Kathie Rhodes, MD;  Location: [facility];  Service: Urology;  Laterality: N/A;  . WIDE EXCISION MALIGNANT MELANOMA LEFT FOREARM/  LEFT AXILLA LYMPH NODE BX  09-10-2004    There were no vitals filed for this visit.  Subjective Assessment - 02/13/19 1310    Pertinent History  Parkinson's disease (03/2017).  PMH:  arthritis, hx of malignant melanoma, HDL, HTN, Peripheral vascular disease, hx of L shoudler arthroscopy 2011    Patient Stated Goals  improve ADLs, prevent future problems    Currently in Pain?  No/denies        [facility] OT Assessment - 02/13/19 0001      Assessment   Medical Diagnosis  Parkinson's Disease    Referring Provider (OT)  Dr. Wells Guiles Tat    Onset Date/Surgical Date  --   dx:  03/2017, OT screen 01/12/19   Hand Dominance  Right    Prior Therapy  PT last year, no OT      Precautions   Precautions  None      Balance Screen   Has the patient fallen in the past 6 months  No      Home  Environment   Family/patient expects to be discharged to:  Private residence    Lives With  [place]  Retired    Counselling psychologist (3x/wk), yoga, water zumba, PD cycle (currently not meeting), stationary bike at home      ADL   Eating/Feeding  --   min difficulty,  with big sandwiches, opening packages   Grooming  Modified independent    Upper Body Bathing  Modified independent   minor limitations in reaching across LUE   Lower Body Bathing  Modified independent    Upper Body Dressing  --   min difficulty with buttons, cuff links   Upper Body Dressing Patient Percentage  --   min difficulty pulling shirt down in back   Lower Body Dressing  --   difficulty getting legs in  pants/underwear   Lower Body Dressing Patient Percentage  --   wears compression stockings sometimes due to swelling.   Tour manager -  Horticulturist, commercial bars;Walk in shower   hand held shower head     IADL   Prior Level of Function Light Housekeeping  pt cleans floor, difficulty with balance with cleaning stairs.  does some yardwork.  (does not mow)    Prior Level of Function Meal Prep  wife performs, pt makes eggs (difficulty flipping)    Investment banker, corporate own vehicle   more difficult to look over shoulder (L)   Psychiatrist financial matters independently (budgets, writes checks, pays rent, bills goes to bank), collects and keeps track of income      Written Expression   Dominant Hand  Right    Handwriting  100% legible   very mild micrographia     Vision - History   Baseline Vision  Wears glasses for distance only   driving, watching tv    Visual History  Corrective eye surgery      Activity Tolerance   Activity Tolerance Comments  mild difficulty with golf      Cognition   Overall Cognitive Status  Within Functional Limits for tasks assessed      Observation/Other Assessments   Standing Functional Reach Test  R-14.5, L-15"    Other Surveys   Select    Physical Performance Test    Yes    Simulated Eating Time (seconds)  9.25sec    Donning Doffing Jacket Time (seconds)  Fastening/unfastening 3 buttons in 33.59 sec   very limited L hand use with fastening buttons   Donning Doffing Jacket Comments  9.85sec      Posture/Postural Control   Posture/Postural Control  Postural limitations    Postural Limitations  Rounded Shoulders;Forward head      Coordination   9 Hole Peg Test  Right;Left    Right 9 Hole Peg Test  24.31    Left 9 Hole Peg Test  46.81    needed cueing to use only L hand, assisted with R x2   Box and Blocks  R-43blocks, L-41blocks    Tremors  primarily with L hand      Tone   Assessment Location  Right Upper Extremity;Left Upper Extremity  ROM / Strength   AROM / PROM / Strength  AROM;PROM      AROM   Overall AROM   Deficits    Overall AROM Comments  approx 75% shoulder flex bilaterally (appears to be limited by posture/bradykinesia), mild decr L supination and unable to oppose to L 5th digit, L wrist fused (due to arthritis)      PROM   Overall PROM   Within functional limits for tasks performed   except L wrist (no flex/ext) (full shoulder PROM)     RUE Tone   RUE Tone  Mild      LUE Tone   LUE Tone  --   mild-moderate                     OT Education - 02/13/19 1454    Education Details  OT eval results/POC;  Recommended pt sit for LB dressing for incr ease/safety, Educated pt in importance of incorporating large amplitude movements into ADLs to incr ease/decr risk for future difficulty (and how to begin to incorporate larger movements into the following activities:  grasping cup, grasping to open bottle, open packages)    Person(s) Educated  Patient    Methods  Explanation;Demonstration;Verbal cues    Comprehension  Verbalized understanding       OT Short Term Goals - 02/13/19 1427      OT SHORT TERM GOAL #1   Title  Pt will be independent with PD-specific HEP--check STGs 03/15/19    Time  4    Period  Weeks    Status  New      OT SHORT TERM GOAL #2   Title  Pt will verbalize understanding of ways to decr risk for future complications related to PD.    Time  4    Period  Weeks    Status  New        OT Long Term Goals - 02/13/19 1426      OT LONG TERM GOAL #1   Title  Pt will verbalize understanding of adaptive strategies to incr ease/efficiency with ADLs/IADLs.--check LTGs 03/30/19    Time  6    Period  Weeks    Status  New      OT LONG TERM GOAL #2   Title  Pt will  improve functional reaching/coordination for ADLs as shown by improving score on box and blocks test by at least 4 bilaterally.    Baseline  R-43, L-41 blocks    Time  6    Period  Weeks    Status  New      OT LONG TERM GOAL #3   Title  Pt will improve L hand coordination for ADLs as shown by improving time on 9-hole peg test by at least 5sec.    Baseline  L-48.81sec    Time  6    Period  Weeks    Status  New      OT LONG TERM GOAL #4   Title  Pt will improve bilateral hand coordination as shown by fastening/unfastening 3 buttons in 30sec or less.    Baseline  33.59sec    Time  6    Period  Weeks            Plan - 02/13/19 1441    Clinical Impression Statement  Pt is a 78 y.o. male with Parkinson's disease.  Pt was diagnosed with PD 03/2017.  Pt was referred to OT by physican on 01/12/19  per PD screen recommendations.  Pt with PMH that includes:arthritis, hx of malignant melanoma, HDL, HTN, Peripheral vascular disease, hx of L shoudler arthroscopy 2011.  Pt is independent with ADLs/IADLs but reports that he is beginning to have mild difficulty with ADLs/IADLs.  Pt presents with bradykinesia, rigidity, decr coordination, decr ROM, tremor, decr functional mobilty, decr posture.  Pt would benefit from occupational therapy to address these deficits for incr ease/efficiency/safety with ADLs/IADLs, to decr risk for future complications, improve UE functional use, and improve quality of life.    OT Occupational Profile and History  Detailed Assessment- Review of Records and additional review of physical, cognitive, psychosocial history related to current functional performance    Occupational performance deficits (Please refer to evaluation for details):  ADL's;IADL's;Leisure;Social Participation    Body Structure / Function / Physical Skills  ADL;Dexterity;ROM;IADL;Balance;Mobility;Coordination;FMC;Tone;UE functional use;Decreased knowledge of use of DME;GMC;Endurance;Improper spinal/pelvic  alignment    Rehab Potential  Good    Clinical Decision Making  Several treatment options, min-mod task modification necessary    Comorbidities Affecting Occupational Performance:  May have comorbidities impacting occupational performance    Modification or Assistance to Complete Evaluation   Min-Moderate modification of tasks or assist with assess necessary to complete eval    OT Frequency  2x / week    OT Duration  6 weeks   +eval (or 13 visits)   OT Treatment/Interventions  Self-care/ADL training;Moist Heat;Fluidtherapy;DME and/or AE instruction;Therapeutic activities;Aquatic Therapy;Therapeutic exercise;Functional Mobility Training;Neuromuscular education;Cryotherapy;Energy conservation;Manual Therapy;Patient/family education    Plan  review previous PT HEP (PWR! Moves prn), coordination HEP    Consulted and Agree with Plan of Care  Patient       Patient will benefit from skilled therapeutic intervention in order to improve the following deficits and impairments:   Body Structure / Function / Physical Skills: ADL, Dexterity, ROM, IADL, Balance, Mobility, Coordination, FMC, Tone, UE functional use, Decreased knowledge of use of DME, GMC, Endurance, Improper spinal/pelvic alignment       Visit Diagnosis: 1. Other symptoms and signs involving the nervous system   2. Other lack of coordination   3. Other symptoms and signs involving the musculoskeletal system   4. Abnormal posture   5. Other abnormalities of gait and mobility   6. Tremor   7. Stiffness of left shoulder, not elsewhere classified   8. Stiffness of right shoulder, not elsewhere classified       Problem List Patient Active Problem List   Diagnosis Date Noted  . Parkinson's disease ([place]) 04/13/2017  . BPH (benign prostatic hypertrophy) with urinary retention 09/30/2015    [facility] 02/13/2019, 2:58 PM  [facility] [address] [place],  [place], [address] Phone: [phone]   Fax:  [phone]  Name: Zachary Wang. MRN: [id number] Date of Birth: [birth year]   Vianne Bulls, OTR/L [facility] [address] [place], [place]  [address] [phone] phone [phone] 02/13/19 3:02 PM

## 2019-02-13 NOTE — Addendum Note (Signed)
Addended by: Vianne Bulls D on: 02/13/2019 03:04 PM   Modules accepted: Orders

## 2019-02-22 ENCOUNTER — Encounter: Payer: Self-pay | Admitting: Occupational Therapy

## 2019-02-22 ENCOUNTER — Other Ambulatory Visit: Payer: Self-pay

## 2019-02-22 ENCOUNTER — Ambulatory Visit: Payer: No Typology Code available for payment source | Attending: Internal Medicine | Admitting: Occupational Therapy

## 2019-02-22 DIAGNOSIS — M25611 Stiffness of right shoulder, not elsewhere classified: Secondary | ICD-10-CM | POA: Diagnosis present

## 2019-02-22 DIAGNOSIS — R2689 Other abnormalities of gait and mobility: Secondary | ICD-10-CM | POA: Diagnosis present

## 2019-02-22 DIAGNOSIS — R29898 Other symptoms and signs involving the musculoskeletal system: Secondary | ICD-10-CM | POA: Insufficient documentation

## 2019-02-22 DIAGNOSIS — R278 Other lack of coordination: Secondary | ICD-10-CM | POA: Insufficient documentation

## 2019-02-22 DIAGNOSIS — R29818 Other symptoms and signs involving the nervous system: Secondary | ICD-10-CM | POA: Insufficient documentation

## 2019-02-22 DIAGNOSIS — R293 Abnormal posture: Secondary | ICD-10-CM | POA: Diagnosis present

## 2019-02-22 DIAGNOSIS — R251 Tremor, unspecified: Secondary | ICD-10-CM | POA: Diagnosis present

## 2019-02-22 DIAGNOSIS — M25612 Stiffness of left shoulder, not elsewhere classified: Secondary | ICD-10-CM | POA: Insufficient documentation

## 2019-02-22 NOTE — Therapy (Signed)
[facility] [address], [place], [address] Phone: [phone]   Fax:  [phone]  Occupational Therapy Treatment  Patient Details  Name: Zachary Wang. MRN: [id number] Date of Birth: [birth year] Referring Provider (OT): Dr. Wells Guiles Tat   Encounter Date: 02/22/2019  OT End of Session - 02/22/19 0715    Visit Number  2    Number of Visits  13    Date for OT Re-Evaluation  03/30/19    Authorization Type  [employer] Medicare 20% aived 5/11-9/30/20 ([place] for up to 15 visits including eval)    Authorization Time Period  Medicare cert. period 02/13/19-05/14/19    Authorization - Visit Number  2   Medicare PN visits   Authorization - Number of Visits  10    OT Start Time  0704    OT Stop Time  0750    OT Time Calculation (min)  46 min    Activity Tolerance  Patient tolerated treatment well    Behavior During Therapy  [facility] for tasks assessed/performed       Past Medical History:  Diagnosis Date  . Arthritis    hands  . BPH (benign prostatic hyperplasia)   . Elevated PSA   . Foley catheter in place   . History of adenomatous polyp of colon    2014  tubular adenoma  . History of malignant melanoma    s/p  excision left forearm and left axill lymph node bx 09-10-2004  . Hyperlipidemia   . Hypertension   . Peripheral vascular disease ([place])   . Urinary retention     Past Surgical History:  Procedure Laterality Date  . ANAL FISSURE REPAIR  1984  . COLONOSCOPY N/A 05/02/2013   Procedure: COLONOSCOPY;  Surgeon: Garlan Fair, MD;  Location: [facility];  Service: Endoscopy;  Laterality: N/A;  . INSERTION OF SUPRAPUBIC CATHETER N/A 09/30/2015   Procedure: INSERTION OF SUPRAPUBIC CATHETER;  Surgeon: Kathie Rhodes, MD;  Location: [facility];  Service: Urology;  Laterality: N/A;  . SHOULDER ARTHROSCOPY W/ SUBACROMIAL DECOMPRESSION AND DISTAL CLAVICLE EXCISION Left 05-20-2010   w/  Debridement  , Bursectomy, Acromioplasty,  Capsulectomy  . TRANSURETHRAL RESECTION OF PROSTATE N/A 09/30/2015   Procedure: TURP (TRANSURETHRAL RESECTION OF PROSTATE WITH GYRUS;  Surgeon: Kathie Rhodes, MD;  Location: [facility];  Service: Urology;  Laterality: N/A;  . WIDE EXCISION MALIGNANT MELANOMA LEFT FOREARM/  LEFT AXILLA LYMPH NODE BX  09-10-2004    There were no vitals filed for this visit.  Subjective Assessment - 02/22/19 0714    Subjective   nothing new    Pertinent History  Parkinson's disease (03/2017).  PMH:  arthritis, hx of malignant melanoma, HDL, HTN, Peripheral vascular disease, hx of L shoudler arthroscopy 2011    Patient Stated Goals  improve ADLs, prevent future problems    Currently in Pain?  No/denies         CLINIC OPERATION CHANGES: Outpatient Neuro Rehab is open at lower capacity following universal masking, social distancing, and patient screening.  The patient's COVID risk of complications score is 3.           OT Education - 02/22/19 0725    Education Details  PWR! hands (basic 4); Coordination HEP with focus on large amplitude movements    Person(s) Educated  Patient    Methods  Explanation;Demonstration;Verbal cues;Handout    Comprehension  Verbalized understanding;Returned demonstration;Verbal cues required  OT Short Term Goals - 02/13/19 1427      OT SHORT TERM GOAL #1   Title  Pt will be independent with PD-specific HEP--check STGs 03/15/19    Time  4    Period  Weeks    Status  New      OT SHORT TERM GOAL #2   Title  Pt will verbalize understanding of ways to decr risk for future complications related to PD.    Time  4    Period  Weeks    Status  New        OT Long Term Goals - 02/13/19 1426      OT LONG TERM GOAL #1   Title  Pt will verbalize understanding of adaptive strategies to incr ease/efficiency with ADLs/IADLs.--check LTGs 03/30/19    Time  6    Period  Weeks    Status  New      OT LONG TERM GOAL #2   Title   Pt will improve functional reaching/coordination for ADLs as shown by improving score on box and blocks test by at least 4 bilaterally.    Baseline  R-43, L-41 blocks    Time  6    Period  Weeks    Status  New      OT LONG TERM GOAL #3   Title  Pt will improve L hand coordination for ADLs as shown by improving time on 9-hole peg test by at least 5sec.    Baseline  L-48.81sec    Time  6    Period  Weeks    Status  New      OT LONG TERM GOAL #4   Title  Pt will improve bilateral hand coordination as shown by fastening/unfastening 3 buttons in 30sec or less.    Baseline  33.59sec    Time  6    Period  Weeks            Plan - 02/22/19 0715    Clinical Impression Statement  Pt responded well to cueing for incr movement amplitude, but needed consistent min cueing to maintain amplitude.    OT Occupational Profile and History  Detailed Assessment- Review of Records and additional review of physical, cognitive, psychosocial history related to current functional performance    Occupational performance deficits (Please refer to evaluation for details):  ADL's;IADL's;Leisure;Social Participation    Body Structure / Function / Physical Skills  ADL;Dexterity;ROM;IADL;Balance;Mobility;Coordination;FMC;Tone;UE functional use;Decreased knowledge of use of DME;GMC;Endurance;Improper spinal/pelvic alignment    Rehab Potential  Good    Clinical Decision Making  Several treatment options, min-mod task modification necessary    Comorbidities Affecting Occupational Performance:  May have comorbidities impacting occupational performance    Modification or Assistance to Complete Evaluation   Min-Moderate modification of tasks or assist with assess necessary to complete eval    OT Frequency  2x / week    OT Duration  6 weeks   +eval (or 13 visits)   OT Treatment/Interventions  Self-care/ADL training;Moist Heat;Fluidtherapy;DME and/or AE instruction;Therapeutic activities;Aquatic Therapy;Therapeutic  exercise;Functional Mobility Training;Neuromuscular education;Cryotherapy;Energy conservation;Manual Therapy;Patient/family education    Plan  review previous PT HEP (PWR! Moves prn), coordination HEP    OT Home Exercise Plan  Education provided:  Coordination HEP with focus on large amplitude, PWR! hands (basic 4)    Consulted and Agree with Plan of Care  Patient       Patient will benefit from skilled therapeutic intervention in order to improve the following deficits and impairments:  Body Structure / Function / Physical Skills: ADL, Dexterity, ROM, IADL, Balance, Mobility, Coordination, FMC, Tone, UE functional use, Decreased knowledge of use of DME, GMC, Endurance, Improper spinal/pelvic alignment       Visit Diagnosis: 1. Other symptoms and signs involving the nervous system   2. Other lack of coordination   3. Other symptoms and signs involving the musculoskeletal system   4. Abnormal posture   5. Tremor   6. Stiffness of left shoulder, not elsewhere classified   7. Stiffness of right shoulder, not elsewhere classified   8. Other abnormalities of gait and mobility       Problem List Patient Active Problem List   Diagnosis Date Noted  . Parkinson's disease ([place]) 04/13/2017  . BPH (benign prostatic hypertrophy) with urinary retention 09/30/2015    [facility] 02/22/2019, 7:48 AM  [facility] [address] [place], [place], [address] Phone: [phone]   Fax:  [phone]  Name: Zachary Wang. MRN: [id number] Date of Birth: 1941/06/05   Vianne Bulls, OTR/L [facility] [address] [place], [place]  [address] [phone] phone [phone] 02/22/19 7:48 AM

## 2019-02-22 NOTE — Patient Instructions (Addendum)
PWR! Hand Exercises:   Make each movement big and deliberate so that you feel the movement.  Then, start with elbows bent and hands closed:  PWR! Hands: Push hands out BIG. Elbows straight, wrists up, fingers open and spread apart BIG. (Can also perform by pushing down on table/chair/knees. Push above head, out to the side, behind you, in front of you.)  PWR! Step: Touch index finger to thumb while keeping other fingers straight. Flick fingers out BIG (thumb out/straighten fingers). Repeat with other fingers. (Step your thumb to each finger).   With arms stretched out in front of you (elbows straight), perform the following:  PWR! Rock:  Move wrists up and down BIG  PWR! Twist: Twist palms up and down BIG  Perform at least 10 repetitions 1x/day, but perform PWR! Hands throughout the day when you are having trouble using your hands (picking up/manipulating small objects, writing, eating, typing, sewing, buttoning, etc.).   Coordination Exercises  Perform the following exercises for 20 minutes 1 times per day. Perform with both hand(s). Perform using big movements.  Pick different activities to perform each day.   Flipping Cards: Place deck of cards on the table. Flip cards over by opening your hand big to grasp and then turn your palm up big, opening hand fully to release.  Deal cards: Hold 1/2 or whole deck in your hand. Use thumb to push card off top of deck with one big push.  Rotate ball with fingertips: Pick up with fingers/thumb and move as much as you can with each turn/movement (clockwise and counter-clockwise).  Toss ball from one hand to the other: Toss big/high.  Deliberately open with toss and deliberately close hand after catch.  Toss ball in the air and catch with the same hand: Toss big/high.  Deliberately open with toss and deliberately close hand after catch.  Rotate 2 golf balls in your hand: Both directions.  Pick up coins and stack one at a time: Pick up  with big, intentional movements. Do not drag coin to the edge. (5-10 in a stack)  Pick up 5-10 coins one at a time and hold in palm. Then, move coins from palm to fingertips one at time and place in coin bank/container.  Practice writing: Slow down, write big, and focus on forming each letter. 

## 2019-02-23 ENCOUNTER — Ambulatory Visit: Payer: No Typology Code available for payment source | Admitting: Occupational Therapy

## 2019-02-23 DIAGNOSIS — R29818 Other symptoms and signs involving the nervous system: Secondary | ICD-10-CM

## 2019-02-23 DIAGNOSIS — M25612 Stiffness of left shoulder, not elsewhere classified: Secondary | ICD-10-CM

## 2019-02-23 DIAGNOSIS — R251 Tremor, unspecified: Secondary | ICD-10-CM

## 2019-02-23 DIAGNOSIS — M25611 Stiffness of right shoulder, not elsewhere classified: Secondary | ICD-10-CM

## 2019-02-23 DIAGNOSIS — R278 Other lack of coordination: Secondary | ICD-10-CM

## 2019-02-23 DIAGNOSIS — R29898 Other symptoms and signs involving the musculoskeletal system: Secondary | ICD-10-CM

## 2019-02-23 DIAGNOSIS — R293 Abnormal posture: Secondary | ICD-10-CM

## 2019-02-23 DIAGNOSIS — R2689 Other abnormalities of gait and mobility: Secondary | ICD-10-CM

## 2019-02-23 NOTE — Therapy (Signed)
[facility] [address] [place], [place], [address] Phone: [phone]   Fax:  [phone]  Occupational Therapy Treatment  Patient Details  Name: Zachary Wang. MRN: [id number] Date of Birth: 01-18-1941 Referring Provider (OT): Dr. Wells Guiles Tat   Encounter Date: 02/23/2019  OT End of Session - 02/23/19 0747    Visit Number  3    Number of Visits  13    Date for OT Re-Evaluation  03/30/19    Authorization Type  [employer] Medicare 20% aived 5/11-9/30/20 ([place] approved OT for up to 15 visits including eval)    Authorization Time Period  Medicare cert. period 02/13/19-05/14/19    Authorization - Visit Number  3   Medicare PN visits   Authorization - Number of Visits  10    OT Start Time  0705    OT Stop Time  0755    OT Time Calculation (min)  50 min    Activity Tolerance  Patient tolerated treatment well    Behavior During Therapy  [facility] for tasks assessed/performed       Past Medical History:  Diagnosis Date  . Arthritis    hands  . BPH (benign prostatic hyperplasia)   . Elevated PSA   . Foley catheter in place   . History of adenomatous polyp of colon    2014  tubular adenoma  . History of malignant melanoma    s/p  excision left forearm and left axill lymph node bx 09-10-2004  . Hyperlipidemia   . Hypertension   . Peripheral vascular disease ([place])   . Urinary retention     Past Surgical History:  Procedure Laterality Date  . ANAL FISSURE REPAIR  1984  . COLONOSCOPY N/A 05/02/2013   Procedure: COLONOSCOPY;  Surgeon: Garlan Fair, MD;  Location: [facility];  Service: Endoscopy;  Laterality: N/A;  . INSERTION OF SUPRAPUBIC CATHETER N/A 09/30/2015   Procedure: INSERTION OF SUPRAPUBIC CATHETER;  Surgeon: Kathie Rhodes, MD;  Location: [facility];  Service: Urology;  Laterality: N/A;  . SHOULDER ARTHROSCOPY W/ SUBACROMIAL DECOMPRESSION AND DISTAL CLAVICLE EXCISION Left 05-20-2010   w/  Debridement  , Bursectomy, Acromioplasty,  Capsulectomy  . TRANSURETHRAL RESECTION OF PROSTATE N/A 09/30/2015   Procedure: TURP (TRANSURETHRAL RESECTION OF PROSTATE WITH GYRUS;  Surgeon: Kathie Rhodes, MD;  Location: [facility];  Service: Urology;  Laterality: N/A;  . WIDE EXCISION MALIGNANT MELANOMA LEFT FOREARM/  LEFT AXILLA LYMPH NODE BX  09-10-2004    There were no vitals filed for this visit.  Subjective Assessment - 02/23/19 0746    Subjective   Practiced golf balls last night...it didn't go well.    Pertinent History  Parkinson's disease (03/2017).  PMH:  arthritis, hx of malignant melanoma, HDL, HTN, Peripheral vascular disease, hx of L shoudler arthroscopy 2011    Patient Stated Goals  improve ADLs, prevent future problems    Currently in Pain?  No/denies       CLINIC OPERATION CHANGES: Outpatient Neuro Rehab is open at lower capacity following universal masking, social distancing, and patient screening.  The patient's COVID risk of complications score is 3.  Reviewed activities from coordination HEP:  Flipping cards with each hand, dealing cards with thumb with each hand, and rotating 2 golf balls in each hand (both directions).  Pt performed all with min cueing for technique and incr movement amplitude.          OT Education - 02/23/19 0740  Education Details  PWR! Moves (basic 4) in modified quadruped and standing with min-mod cueing and emphasis on large amplitude movement (made notes on handouts from PT). educated pt in how PWR! moves relate to functional movements and why this is important to decr risk for future complications; Educated pt in how PD can incr risk for shoulder injuries/pain and how to decr risk with improved positioning and emphasis on large amplitude.  PWR! hands (basic 4) reviewed.  Educated pt in how PD can affect vision and importance of eye movements with PWR! moves.  Practiced using large amplitude movement strategies for reach for item off the  ground.    Person(s) Educated  Patient    Methods  Explanation;Handout;Demonstration;Verbal cues    Comprehension  Verbalized understanding;Returned demonstration;Verbal cues required       OT Short Term Goals - 02/13/19 1427      OT SHORT TERM GOAL #1   Title  Pt will be independent with PD-specific HEP--check STGs 03/15/19    Time  4    Period  Weeks    Status  New      OT SHORT TERM GOAL #2   Title  Pt will verbalize understanding of ways to decr risk for future complications related to PD.    Time  4    Period  Weeks    Status  New        OT Long Term Goals - 02/13/19 1426      OT LONG TERM GOAL #1   Title  Pt will verbalize understanding of adaptive strategies to incr ease/efficiency with ADLs/IADLs.--check LTGs 03/30/19    Time  6    Period  Weeks    Status  New      OT LONG TERM GOAL #2   Title  Pt will improve functional reaching/coordination for ADLs as shown by improving score on box and blocks test by at least 4 bilaterally.    Baseline  R-43, L-41 blocks    Time  6    Period  Weeks    Status  New      OT LONG TERM GOAL #3   Title  Pt will improve L hand coordination for ADLs as shown by improving time on 9-hole peg test by at least 5sec.    Baseline  L-48.81sec    Time  6    Period  Weeks    Status  New      OT LONG TERM GOAL #4   Title  Pt will improve bilateral hand coordination as shown by fastening/unfastening 3 buttons in 30sec or less.    Baseline  33.59sec    Time  6    Period  Weeks            Plan - 02/23/19 8938    Clinical Impression Statement  Pt continues to respond well to cueing for incr movement amplitude.  Pt verbalized understanding of education provided, but will benefit from further repetition to incr carryover/calibration into functinal tasks.    OT Occupational Profile and History  Detailed Assessment- Review of Records and additional review of physical, cognitive, psychosocial history related to current functional  performance    Occupational performance deficits (Please refer to evaluation for details):  ADL's;IADL's;Leisure;Social Participation    Body Structure / Function / Physical Skills  ADL;Dexterity;ROM;IADL;Balance;Mobility;Coordination;FMC;Tone;UE functional use;Decreased knowledge of use of DME;GMC;Endurance;Improper spinal/pelvic alignment    Rehab Potential  Good    Clinical Decision Making  Several treatment options, min-mod task modification  necessary    Comorbidities Affecting Occupational Performance:  May have comorbidities impacting occupational performance    Modification or Assistance to Complete Evaluation   Min-Moderate modification of tasks or assist with assess necessary to complete eval    OT Frequency  2x / week    OT Duration  6 weeks   +eval (or 13 visits)   OT Treatment/Interventions  Self-care/ADL training;Moist Heat;Fluidtherapy;DME and/or AE instruction;Therapeutic activities;Aquatic Therapy;Therapeutic exercise;Functional Mobility Training;Neuromuscular education;Cryotherapy;Energy conservation;Manual Therapy;Patient/family education    Plan  review supine and sitting PWR! moves, continue to review coordination HEP    OT Home Exercise Plan  Education provided:  Coordination HEP with focus on large amplitude, PWR! hands (basic 4)    Consulted and Agree with Plan of Care  Patient       Patient will benefit from skilled therapeutic intervention in order to improve the following deficits and impairments:   Body Structure / Function / Physical Skills: ADL, Dexterity, ROM, IADL, Balance, Mobility, Coordination, FMC, Tone, UE functional use, Decreased knowledge of use of DME, GMC, Endurance, Improper spinal/pelvic alignment       Visit Diagnosis: 1. Other symptoms and signs involving the nervous system   2. Other lack of coordination   3. Other symptoms and signs involving the musculoskeletal system   4. Abnormal posture   5. Tremor   6. Stiffness of left shoulder, not  elsewhere classified   7. Stiffness of right shoulder, not elsewhere classified   8. Other abnormalities of gait and mobility       Problem List Patient Active Problem List   Diagnosis Date Noted  . Parkinson's disease ([place]) 04/13/2017  . BPH (benign prostatic hypertrophy) with urinary retention 09/30/2015    [facility] 02/23/2019, 8:56 AM  [facility] [address] [place], [place], [address] Phone: [phone]   Fax:  [phone]  Name: Zachary Wang. MRN: [id number] Date of Birth: March 15, 1941   Vianne Bulls, OTR/L [facility] [address] [place],   [address] [phone] phone [phone] 02/23/19 8:56 AM

## 2019-03-01 ENCOUNTER — Other Ambulatory Visit: Payer: Self-pay

## 2019-03-01 ENCOUNTER — Ambulatory Visit: Payer: No Typology Code available for payment source | Admitting: Occupational Therapy

## 2019-03-01 DIAGNOSIS — R29898 Other symptoms and signs involving the musculoskeletal system: Secondary | ICD-10-CM

## 2019-03-01 DIAGNOSIS — M25611 Stiffness of right shoulder, not elsewhere classified: Secondary | ICD-10-CM

## 2019-03-01 DIAGNOSIS — R29818 Other symptoms and signs involving the nervous system: Secondary | ICD-10-CM

## 2019-03-01 DIAGNOSIS — M25612 Stiffness of left shoulder, not elsewhere classified: Secondary | ICD-10-CM

## 2019-03-01 DIAGNOSIS — R251 Tremor, unspecified: Secondary | ICD-10-CM

## 2019-03-01 DIAGNOSIS — R278 Other lack of coordination: Secondary | ICD-10-CM

## 2019-03-01 NOTE — Therapy (Signed)
[facility] [address] [place], [place], [address] Phone: [phone]   Fax:  [phone]  Occupational Therapy Treatment  Patient Details  Name: Zachary Wang. MRN: [id number] Date of Birth: [birth year] Referring Provider (OT): Dr. Wells Guiles Tat   Encounter Date: 03/01/2019  OT End of Session - 03/01/19 1338    Visit Number  4    Number of Visits  13    Date for OT Re-Evaluation  03/30/19    Authorization Type  [employer] Medicare 20% aived 5/11-9/30/20 ([place] approved [place] for up to 15 visits including eval)    Authorization Time Period  Medicare cert. period 02/13/19-05/14/19    Authorization - Visit Number  4   Medicare PN visits   Authorization - Number of Visits  10    OT Start Time  1302    OT Stop Time  1345    OT Time Calculation (min)  43 min    Activity Tolerance  Patient tolerated treatment well    Behavior During Therapy  WFL for tasks assessed/performed       Past Medical History:  Diagnosis Date  . Arthritis    hands  . BPH (benign prostatic hyperplasia)   . Elevated PSA   . Foley catheter in place   . History of adenomatous polyp of colon    2014  tubular adenoma  . History of malignant melanoma    s/p  excision left forearm and left axill lymph node bx 09-10-2004  . Hyperlipidemia   . Hypertension   . Peripheral vascular disease ([place])   . Urinary retention     Past Surgical History:  Procedure Laterality Date  . ANAL FISSURE REPAIR  1984  . COLONOSCOPY N/A 05/02/2013   Procedure: COLONOSCOPY;  Surgeon: Garlan Fair, MD;  Location: [facility];  Service: Endoscopy;  Laterality: N/A;  . INSERTION OF SUPRAPUBIC CATHETER N/A 09/30/2015   Procedure: INSERTION OF SUPRAPUBIC CATHETER;  Surgeon: Kathie Rhodes, MD;  Location[facility];  Service: Urology;  Laterality: N/A;  . SHOULDER ARTHROSCOPY W/ SUBACROMIAL DECOMPRESSION AND DISTAL CLAVICLE EXCISION Left 05-20-2010   w/  Debridement ,  Bursectomy, Acromioplasty,  Capsulectomy  . TRANSURETHRAL RESECTION OF PROSTATE N/A 09/30/2015   Procedure: TURP (TRANSURETHRAL RESECTION OF PROSTATE WITH GYRUS;  Surgeon: Kathie Rhodes, MD;  Location[facility]  Service: Urology;  Laterality: N/A;  . WIDE EXCISION MALIGNANT MELANOMA LEFT FOREARM/  LEFT AXILLA LYMPH NODE BX  09-10-2004    There were no vitals filed for this visit.  Subjective Assessment - 03/01/19 1336    Subjective   denies pain    Pertinent History  Parkinson's disease (03/2017).  PMH:  arthritis, hx of malignant melanoma, HDL, HTN, Peripheral vascular disease, hx of L shoudler arthroscopy 2011    Patient Stated Goals  improve ADLs, prevent future problems    Currently in Pain?  No/denies              CLINIC OPERATION CHANGES: Outpatient Neuro Rehab is open at lower capacity following universal masking, social distancing, and patient screening.  The patient's COVID risk of complications score is 3.  Reviewed PWR! Hands basic 4  And  activities from coordination HEP:  Flipping cards with each hand, dealing cards with thumb with each hand, rotating and tossing ball in hand then rotating 2 golf balls in each hand (both directions).  Pt performed all with min cueing for technique and incr movement amplitude  OT Education - 03/01/19 1504    Education Details  PWR! basic 4 in supine and seated 10-20 reps each, reviewed activities from coordination HEP, (flipping/ dealing cards, stacking and manipulating coins, rotating ball and rotaing 2 golf balls, )min v.c for larger amplitude movements and techniques.    Person(s) Educated  Patient    Methods  Explanation;Demonstration;Verbal cues    Comprehension  Verbalized understanding;Returned demonstration       OT Short Term Goals - 03/01/19 1503      OT SHORT TERM GOAL #1   Title  Pt will be independent with PD-specific HEP--check STGs 03/15/19    Time  4    Period  Weeks     Status  New      OT SHORT TERM GOAL #2   Title  Pt will verbalize understanding of ways to decr risk for future complications related to PD.    Time  4    Period  Weeks    Status  New        OT Long Term Goals - 03/01/19 1503      OT LONG TERM GOAL #1   Title  Pt will verbalize understanding of adaptive strategies to incr ease/efficiency with ADLs/IADLs.--check LTGs 03/30/19    Time  6    Period  Weeks    Status  New      OT LONG TERM GOAL #2   Title  Pt will improve functional reaching/coordination for ADLs as shown by improving score on box and blocks test by at least 4 bilaterally.    Baseline  R-43, L-41 blocks    Time  6    Period  Weeks    Status  New      OT LONG TERM GOAL #3   Title  Pt will improve L hand coordination for ADLs as shown by improving time on 9-hole peg test by at least 5sec.    Baseline  L-48.81sec    Time  6    Period  Weeks    Status  New      OT LONG TERM GOAL #4   Title  Pt will improve bilateral hand coordination as shown by fastening/unfastening 3 buttons in 30sec or less.    Baseline  33.59sec    Time  6    Period  Weeks            Plan - 03/01/19 1501    Clinical Impression Statement  Pt is progressing towards goals. He responds well to v.c for larger amplitude movements with exercises.    OT Occupational Profile and History  Detailed Assessment- Review of Records and additional review of physical, cognitive, psychosocial history related to current functional performance    Occupational performance deficits (Please refer to evaluation for details):  ADL's;IADL's;Leisure;Social Participation    Body Structure / Function / Physical Skills  ADL;Dexterity;ROM;IADL;Balance;Mobility;Coordination;FMC;Tone;UE functional use;Decreased knowledge of use of DME;GMC;Endurance;Improper spinal/pelvic alignment    Rehab Potential  Good    Clinical Decision Making  Several treatment options, min-mod task modification necessary    Comorbidities  Affecting Occupational Performance:  May have comorbidities impacting occupational performance    Modification or Assistance to Complete Evaluation   Min-Moderate modification of tasks or assist with assess necessary to complete eval    OT Frequency  2x / week    OT Duration  6 weeks   +eval (or 13 visits)   OT Treatment/Interventions  Self-care/ADL training;Moist Heat;Fluidtherapy;DME and/or AE instruction;Therapeutic activities;Aquatic Therapy;Therapeutic exercise;Functional Mobility Training;Neuromuscular  education;Cryotherapy;Energy conservation;Manual Therapy;Patient/family education    Plan  ADL strategies, modified quadraped PWR!    OT Home Exercise Plan  Education provided:  Coordination HEP with focus on large amplitude, PWR! hands (basic 4)    Consulted and Agree with Plan of Care  Patient       Patient will benefit from skilled therapeutic intervention in order to improve the following deficits and impairments:   Body Structure / Function / Physical Skills: ADL, Dexterity, ROM, IADL, Balance, Mobility, Coordination, FMC, Tone, UE functional use, Decreased knowledge of use of DME, GMC, Endurance, Improper spinal/pelvic alignment       Visit Diagnosis: 1. Other symptoms and signs involving the nervous system   2. Other lack of coordination   3. Other symptoms and signs involving the musculoskeletal system   4. Stiffness of left shoulder, not elsewhere classified   5. Stiffness of right shoulder, not elsewhere classified   6. Tremor       Problem List Patient Active Problem List   Diagnosis Date Noted  . Parkinson's disease ([place]) 04/13/2017  . BPH (benign prostatic hypertrophy) with urinary retention 09/30/2015    Jelina Paulsen 03/01/2019, 5:08 PM Theone Murdoch, OTR/L [phone] Phone: [phone] 5:08 PM 03/01/19 [facility] [address] [place], [place], [address] Phone: [phone]   Fax:   [phone]  Name: Hakan Nudelman. MRN: [id number] Date of Birth: 08-Jan-1941

## 2019-03-08 ENCOUNTER — Other Ambulatory Visit: Payer: Self-pay

## 2019-03-08 ENCOUNTER — Ambulatory Visit: Payer: No Typology Code available for payment source | Admitting: Occupational Therapy

## 2019-03-08 DIAGNOSIS — R278 Other lack of coordination: Secondary | ICD-10-CM

## 2019-03-08 DIAGNOSIS — R29818 Other symptoms and signs involving the nervous system: Secondary | ICD-10-CM | POA: Diagnosis not present

## 2019-03-08 DIAGNOSIS — M25612 Stiffness of left shoulder, not elsewhere classified: Secondary | ICD-10-CM

## 2019-03-08 DIAGNOSIS — M25611 Stiffness of right shoulder, not elsewhere classified: Secondary | ICD-10-CM

## 2019-03-08 DIAGNOSIS — R29898 Other symptoms and signs involving the musculoskeletal system: Secondary | ICD-10-CM

## 2019-03-08 NOTE — Therapy (Signed)
[facility] [address] [place], [place], [address] Phone: [phone]   Fax:  [phone]  Occupational Therapy Treatment  Patient Details  Name: Zachary Wang. MRN: [id number] Date of Birth: May 18, 1941 Referring Provider (OT): Dr. Wells Guiles Tat   Encounter Date: 03/08/2019  OT End of Session - 03/08/19 1444    Visit Number  5    Number of Visits  13    Date for OT Re-Evaluation  03/30/19    Authorization Type  [employer] Medicare 20% aived 5/11-9/30/20 ([place] approved OT for up to 15 visits including eval)    Authorization Time Period  Medicare cert. period 02/13/19-05/14/19    Authorization - Visit Number  5   Medicare PN visits   Authorization - Number of Visits  10    OT Start Time  6073    OT Stop Time  7106    OT Time Calculation (min)  43 min    Activity Tolerance  Patient tolerated treatment well    Behavior During Therapy  WFL for tasks assessed/performed       Past Medical History:  Diagnosis Date  . Arthritis    hands  . BPH (benign prostatic hyperplasia)   . Elevated PSA   . Foley catheter in place   . History of adenomatous polyp of colon    2014  tubular adenoma  . History of malignant melanoma    s/p  excision left forearm and left axill lymph node bx 09-10-2004  . Hyperlipidemia   . Hypertension   . Peripheral vascular disease ([place])   . Urinary retention     Past Surgical History:  Procedure Laterality Date  . ANAL FISSURE REPAIR  1984  . COLONOSCOPY N/A 05/02/2013   Procedure: COLONOSCOPY;  Surgeon: Garlan Fair, MD;  Location: [facility];  Service: Endoscopy;  Laterality: N/A;  . INSERTION OF SUPRAPUBIC CATHETER N/A 09/30/2015   Procedure: INSERTION OF SUPRAPUBIC CATHETER;  Surgeon: Kathie Rhodes, MD;  Location: [facility];  Service: Urology;  Laterality: N/A;  . SHOULDER ARTHROSCOPY W/ SUBACROMIAL DECOMPRESSION AND DISTAL CLAVICLE EXCISION Left 05-20-2010   w/  Debridement  , Bursectomy, Acromioplasty,  Capsulectomy  . TRANSURETHRAL RESECTION OF PROSTATE N/A 09/30/2015   Procedure: TURP (TRANSURETHRAL RESECTION OF PROSTATE WITH GYRUS;  Surgeon: Kathie Rhodes, MD;  Location: [facility];  Service: Urology;  Laterality: N/A;  . WIDE EXCISION MALIGNANT MELANOMA LEFT FOREARM/  LEFT AXILLA LYMPH NODE BX  09-10-2004    There were no vitals filed for this visit.  Subjective Assessment - 03/08/19 1404    Subjective   denies pain    Pertinent History  Parkinson's disease (03/2017).  PMH:  arthritis, hx of malignant melanoma, HDL, HTN, Peripheral vascular disease, hx of L shoudler arthroscopy 2011    Patient Stated Goals  improve ADLs, prevent future problems    Currently in Pain?  No/denies              CLINIC OPERATION CHANGES: Outpatient Neuro Rehab is open at lower capacity following universal masking, social distancing, and patient screening.  The patient's COVID risk of complications score is 3. Education performed regarding ways to prevent future complications and big movements with ADLs.  Reviewed PWR! Hands basic 4,  5-10 reps each. Flipping cards with each hand, dealing cards with thumb with each hand,stacking and manipulating coins in hand,  Rotating ball in hand .  Pt performed all with min cueing for technique and incr  movement amplitude. Placing grooved pegs into pegboard for increased fine motor coordination with right and left UE's, min-mod difficulty , increased time with LUE, min v.c removing with in hand manipulation.               OT Education - 03/08/19 1444    Education Details  ways to prevent future complications, big movements with ADLS    Person(s) Educated  Patient    Methods  Explanation;Demonstration;Handout    Comprehension  Verbalized understanding       OT Short Term Goals - 03/01/19 1503      OT SHORT TERM GOAL #1   Title  Pt will be independent with PD-specific HEP--check STGs 03/15/19    Time  4     Period  Weeks    Status  New      OT SHORT TERM GOAL #2   Title  Pt will verbalize understanding of ways to decr risk for future complications related to PD.    Time  4    Period  Weeks    Status  New        OT Long Term Goals - 03/08/19 1410      OT LONG TERM GOAL #1   Title  Pt will verbalize understanding of adaptive strategies to incr ease/efficiency with ADLs/IADLs.--check LTGs 03/30/19    Time  6    Period  Weeks    Status  New      OT LONG TERM GOAL #2   Title  Pt will improve functional reaching/coordination for ADLs as shown by improving score on box and blocks test by at least 4 bilaterally.    Baseline  R-43, L-41 blocks    Time  6    Period  Weeks    Status  New      OT LONG TERM GOAL #3   Title  Pt will improve L hand coordination for ADLs as shown by improving time on 9-hole peg test by at least 5sec.    Baseline  L-48.81sec    Time  6    Period  Weeks    Status  New      OT LONG TERM GOAL #4   Title  Pt will improve bilateral hand coordination as shown by fastening/unfastening 3 buttons in 30sec or less.    Baseline  33.59sec    Time  6    Period  Weeks            Plan - 03/08/19 1445    Clinical Impression Statement  Pt is progressing towards goals. He reports participating in [place] this a.m and playing golf. Pt demonstrates good understanding of coordination HEP.    OT Occupational Profile and History  Detailed Assessment- Review of Records and additional review of physical, cognitive, psychosocial history related to current functional performance    Occupational performance deficits (Please refer to evaluation for details):  ADL's;IADL's;Leisure;Social Participation    Body Structure / Function / Physical Skills  ADL;Dexterity;ROM;IADL;Balance;Mobility;Coordination;FMC;Tone;UE functional use;Decreased knowledge of use of DME;GMC;Endurance;Improper spinal/pelvic alignment    Rehab Potential  Good    Clinical Decision Making  Several treatment  options, min-mod task modification necessary    Comorbidities Affecting Occupational Performance:  May have comorbidities impacting occupational performance    Modification or Assistance to Complete Evaluation   Min-Moderate modification of tasks or assist with assess necessary to complete eval    OT Frequency  2x / week    OT Duration  6 weeks   +  eval (or 13 visits)   OT Treatment/Interventions  Self-care/ADL training;Moist Heat;Fluidtherapy;DME and/or AE instruction;Therapeutic activities;Aquatic Therapy;Therapeutic exercise;Functional Mobility Training;Neuromuscular education;Cryotherapy;Energy conservation;Manual Therapy;Patient/family education    Plan  ADL stratgies, work towards goals    [place]  Education provided:  Coordination HEP with focus on large amplitude, PWR! hands (basic 4)    Consulted and Agree with Plan of Care  Patient       Patient will benefit from skilled therapeutic intervention in order to improve the following deficits and impairments:   Body Structure / Function / Physical Skills: ADL, Dexterity, ROM, IADL, Balance, Mobility, Coordination, FMC, Tone, UE functional use, Decreased knowledge of use of DME, GMC, Endurance, Improper spinal/pelvic alignment       Visit Diagnosis: 1. Other symptoms and signs involving the nervous system   2. Other lack of coordination   3. Other symptoms and signs involving the musculoskeletal system   4. Stiffness of left shoulder, not elsewhere classified   5. Stiffness of right shoulder, not elsewhere classified       Problem List Patient Active Problem List   Diagnosis Date Noted  . Parkinson's disease ([place]) 04/13/2017  . BPH (benign prostatic hypertrophy) with urinary retention 09/30/2015    Zachary Wang 03/08/2019, 2:47 PM  [facility] [address] [place], [place], [address] Phone: [phone]   Fax:  [phone]  Name: Zachary Wang. MRN: [id number] Date of Birth: 1940/08/23

## 2019-03-13 ENCOUNTER — Ambulatory Visit: Payer: No Typology Code available for payment source | Admitting: Occupational Therapy

## 2019-03-13 ENCOUNTER — Other Ambulatory Visit: Payer: Self-pay

## 2019-03-13 ENCOUNTER — Encounter: Payer: Self-pay | Admitting: Occupational Therapy

## 2019-03-13 DIAGNOSIS — R29818 Other symptoms and signs involving the nervous system: Secondary | ICD-10-CM | POA: Diagnosis not present

## 2019-03-13 DIAGNOSIS — R2689 Other abnormalities of gait and mobility: Secondary | ICD-10-CM

## 2019-03-13 DIAGNOSIS — R29898 Other symptoms and signs involving the musculoskeletal system: Secondary | ICD-10-CM

## 2019-03-13 DIAGNOSIS — R278 Other lack of coordination: Secondary | ICD-10-CM

## 2019-03-13 DIAGNOSIS — M25612 Stiffness of left shoulder, not elsewhere classified: Secondary | ICD-10-CM

## 2019-03-13 DIAGNOSIS — R251 Tremor, unspecified: Secondary | ICD-10-CM

## 2019-03-13 DIAGNOSIS — R293 Abnormal posture: Secondary | ICD-10-CM

## 2019-03-13 DIAGNOSIS — M25611 Stiffness of right shoulder, not elsewhere classified: Secondary | ICD-10-CM

## 2019-03-13 NOTE — Therapy (Signed)
[facility] [address] [place], [place], [address] Phone: [phone]   Fax:  [phone]  Occupational Therapy Treatment  Patient Details  Name: Zachary Wang. MRN: [id number] Date of Birth: 1941/06/19 Referring Provider (OT): Dr. Wells Guiles Tat   Encounter Date: 03/13/2019  OT End of Session - 03/13/19 0723    Visit Number  6    Number of Visits  13    Date for OT Re-Evaluation  03/30/19    Authorization Type  [employer] Medicare 20% aived 5/11-9/30/20 ([place] approved OT for up to 15 visits including eval)    Authorization Time Period  Medicare cert. period 02/13/19-05/14/19    Authorization - Visit Number  6   Medicare PN visits   Authorization - Number of Visits  10    OT Start Time  0707    OT Stop Time  0748    OT Time Calculation (min)  41 min    Activity Tolerance  Patient tolerated treatment well    Behavior During Therapy  [facility] for tasks assessed/performed       Past Medical History:  Diagnosis Date  . Arthritis    hands  . BPH (benign prostatic hyperplasia)   . Elevated PSA   . Foley catheter in place   . History of adenomatous polyp of colon    2014  tubular adenoma  . History of malignant melanoma    s/p  excision left forearm and left axill lymph node bx 09-10-2004  . Hyperlipidemia   . Hypertension   . Peripheral vascular disease ([place])   . Urinary retention     Past Surgical History:  Procedure Laterality Date  . ANAL FISSURE REPAIR  1984  . COLONOSCOPY N/A 05/02/2013   Procedure: COLONOSCOPY;  Surgeon: Garlan Fair, MD;  [facility];  Service: Endoscopy;  Laterality: N/A;  . INSERTION OF SUPRAPUBIC CATHETER N/A 09/30/2015   Procedure: INSERTION OF SUPRAPUBIC CATHETER;  Surgeon: Kathie Rhodes, MD;  Location: [facility];  Service: Urology;  Laterality: N/A;  . SHOULDER ARTHROSCOPY W/ SUBACROMIAL DECOMPRESSION AND DISTAL CLAVICLE EXCISION Left 05-20-2010   w/  Debridement  , Bursectomy, Acromioplasty,  Capsulectomy  . TRANSURETHRAL RESECTION OF PROSTATE N/A 09/30/2015   Procedure: TURP (TRANSURETHRAL RESECTION OF PROSTATE WITH GYRUS;  Surgeon: Kathie Rhodes, MD;  Location[facility];  Service: Urology;  Laterality: N/A;  . WIDE EXCISION MALIGNANT MELANOMA LEFT FOREARM/  LEFT AXILLA LYMPH NODE BX  09-10-2004    There were no vitals filed for this visit.  Subjective Assessment - 03/13/19 0719    Subjective   exercises are going well.  Pt is going to [place] next week    Pertinent History  Parkinson's disease (03/2017).  PMH:  arthritis, hx of malignant melanoma, HDL, HTN, Peripheral vascular disease, hx of L shoudler arthroscopy 2011    Patient Stated Goals  improve ADLs, prevent future problems    Currently in Pain?  No/denies        CLINIC OPERATION CHANGES: Outpatient Neuro Rehab is open at lower capacity following universal masking, social distancing, and patient screening.  The patient's COVID risk of complications score is 3.  Placing grooved pegs in pegboard with L hand with min-mod difficulty with use of PWR! Hands prior with min v.c.  Sliding cards across table using PWR! Hands with min difficulty L hand, performed with BUEs.  Briefly reviewed use of big movements for ADLs strategies.  Functional step and reach forward/back  to manipulate 3 large pegs in hand to place in vertical pegboard with min cueing for coordination and set-up for large movements.  In standing, tossing scarves in each hand in sequence, between hands, and with therapist with focus on PWR! Hands/large amplitude movements, min v.c.          OT Short Term Goals - 03/13/19 0727      OT SHORT TERM GOAL #1   Title  Pt will be independent with PD-specific HEP--check STGs 03/15/19    Time  4    Period  Weeks    Status  Achieved      OT SHORT TERM GOAL #2   Title  Pt will verbalize understanding of ways to decr risk for future complications related to PD.     Time  4    Period  Weeks    Status  Achieved        OT Long Term Goals - 03/08/19 1410      OT LONG TERM GOAL #1   Title  Pt will verbalize understanding of adaptive strategies to incr ease/efficiency with ADLs/IADLs.--check LTGs 03/30/19    Time  6    Period  Weeks    Status  New      OT LONG TERM GOAL #2   Title  Pt will improve functional reaching/coordination for ADLs as shown by improving score on box and blocks test by at least 4 bilaterally.    Baseline  R-43, L-41 blocks    Time  6    Period  Weeks    Status  New      OT LONG TERM GOAL #3   Title  Pt will improve L hand coordination for ADLs as shown by improving time on 9-hole peg test by at least 5sec.    Baseline  L-48.81sec    Time  6    Period  Weeks    Status  New      OT LONG TERM GOAL #4   Title  Pt will improve bilateral hand coordination as shown by fastening/unfastening 3 buttons in 30sec or less.    Baseline  33.59sec    Time  6    Period  Weeks            Plan - 03/13/19 1962    Clinical Impression Statement  Pt is making good progress towards goals.  Pt continues to demo incr difficulty with L hand coordination.    OT Occupational Profile and History  Detailed Assessment- Review of Records and additional review of physical, cognitive, psychosocial history related to current functional performance    Occupational performance deficits (Please refer to evaluation for details):  ADL's;IADL's;Leisure;Social Participation    Body Structure / Function / Physical Skills  ADL;Dexterity;ROM;IADL;Balance;Mobility;Coordination;FMC;Tone;UE functional use;Decreased knowledge of use of DME;GMC;Endurance;Improper spinal/pelvic alignment    Rehab Potential  Good    Clinical Decision Making  Several treatment options, min-mod task modification necessary    Comorbidities Affecting Occupational Performance:  May have comorbidities impacting occupational performance    Modification or Assistance to Complete  Evaluation   Min-Moderate modification of tasks or assist with assess necessary to complete eval    OT Frequency  2x / week    OT Duration  6 weeks   +eval (or 13 visits)   OT Treatment/Interventions  Self-care/ADL training;Moist Heat;Fluidtherapy;DME and/or AE instruction;Therapeutic activities;Aquatic Therapy;Therapeutic exercise;Functional Mobility Training;Neuromuscular education;Cryotherapy;Energy conservation;Manual Therapy;Patient/family education    Plan  ADL stratgies, work towards goals, in hand manipulation/coordination L hand  OT Home Exercise Plan  Education provided:  Coordination HEP with focus on large amplitude, PWR! hands (basic 4)    Consulted and Agree with Plan of Care  Patient       Patient will benefit from skilled therapeutic intervention in order to improve the following deficits and impairments:   Body Structure / Function / Physical Skills: ADL, Dexterity, ROM, IADL, Balance, Mobility, Coordination, FMC, Tone, UE functional use, Decreased knowledge of use of DME, GMC, Endurance, Improper spinal/pelvic alignment       Visit Diagnosis: 1. Other symptoms and signs involving the nervous system   2. Other lack of coordination   3. Other symptoms and signs involving the musculoskeletal system   4. Stiffness of left shoulder, not elsewhere classified   5. Stiffness of right shoulder, not elsewhere classified   6. Tremor   7. Abnormal posture   8. Other abnormalities of gait and mobility       Problem List Patient Active Problem List   Diagnosis Date Noted  . Parkinson's disease ([place]) 04/13/2017  . BPH (benign prostatic hypertrophy) with urinary retention 09/30/2015    [facility] 03/13/2019, 8:58 AM  [facility] [address] [place], [place], [address] Phone: [phone]   Fax:  [phone]  Name: Zachary Wang. MRN: [id number] Date of Birth: 10-02-1940   Vianne Bulls, OTR/L [facility] [address] [place], [place]  [address] [phone] phone [phone] 03/13/19 8:58 AM

## 2019-03-15 ENCOUNTER — Ambulatory Visit: Payer: No Typology Code available for payment source | Admitting: Occupational Therapy

## 2019-03-15 ENCOUNTER — Encounter: Payer: Self-pay | Admitting: Occupational Therapy

## 2019-03-15 ENCOUNTER — Other Ambulatory Visit: Payer: Self-pay

## 2019-03-15 DIAGNOSIS — R293 Abnormal posture: Secondary | ICD-10-CM

## 2019-03-15 DIAGNOSIS — M25612 Stiffness of left shoulder, not elsewhere classified: Secondary | ICD-10-CM

## 2019-03-15 DIAGNOSIS — R251 Tremor, unspecified: Secondary | ICD-10-CM

## 2019-03-15 DIAGNOSIS — M25611 Stiffness of right shoulder, not elsewhere classified: Secondary | ICD-10-CM

## 2019-03-15 DIAGNOSIS — R2689 Other abnormalities of gait and mobility: Secondary | ICD-10-CM

## 2019-03-15 DIAGNOSIS — R29818 Other symptoms and signs involving the nervous system: Secondary | ICD-10-CM | POA: Diagnosis not present

## 2019-03-15 DIAGNOSIS — R29898 Other symptoms and signs involving the musculoskeletal system: Secondary | ICD-10-CM

## 2019-03-15 DIAGNOSIS — R278 Other lack of coordination: Secondary | ICD-10-CM

## 2019-03-15 NOTE — Therapy (Signed)
[facility] [address] [place], [place], [address] Phone: [phone]   Fax:  [phone]  Occupational Therapy Treatment  Patient Details  Name: Zachary Wang. MRN: [id number] Date of Birth: Dec 28, 1940 Referring Provider (OT): Dr. Wells Guiles Tat   Encounter Date: 03/15/2019  OT End of Session - 03/15/19 1307    Visit Number  7    Number of Visits  13    Date for OT Re-Evaluation  03/30/19    Authorization Type  [employer] Medicare 20% aived 5/11-9/30/20 ([place] approved [place] for up to 15 visits including eval)    Authorization Time Period  Medicare cert. period 02/13/19-05/14/19    Authorization - Visit Number  7   Medicare PN visits   Authorization - Number of Visits  10    OT Start Time  9030    OT Stop Time  1355    OT Time Calculation (min)  50 min    Activity Tolerance  Patient tolerated treatment well    Behavior During Therapy  WFL for tasks assessed/performed       Past Medical History:  Diagnosis Date  . Arthritis    hands  . BPH (benign prostatic hyperplasia)   . Elevated PSA   . Foley catheter in place   . History of adenomatous polyp of colon    2014  tubular adenoma  . History of malignant melanoma    s/p  excision left forearm and left axill lymph node bx 09-10-2004  . Hyperlipidemia   . Hypertension   . Peripheral vascular disease ([place])   . Urinary retention     Past Surgical History:  Procedure Laterality Date  . ANAL FISSURE REPAIR  1984  . COLONOSCOPY N/A 05/02/2013   Procedure: COLONOSCOPY;  Surgeon: Garlan Fair, MD;  Location: [facility];  Service: Endoscopy;  Laterality: N/A;  . INSERTION OF SUPRAPUBIC CATHETER N/A 09/30/2015   Procedure: INSERTION OF SUPRAPUBIC CATHETER;  Surgeon: Kathie Rhodes, MD;  Location: [facility];  Service: Urology;  Laterality: N/A;  . SHOULDER ARTHROSCOPY W/ SUBACROMIAL DECOMPRESSION AND DISTAL CLAVICLE EXCISION Left 05-20-2010   w/  Debridement  , Bursectomy, Acromioplasty,  Capsulectomy  . TRANSURETHRAL RESECTION OF PROSTATE N/A 09/30/2015   Procedure: TURP (TRANSURETHRAL RESECTION OF PROSTATE WITH GYRUS;  Surgeon: Kathie Rhodes, MD;  Location: [facility];  Service: Urology;  Laterality: N/A;  . WIDE EXCISION MALIGNANT MELANOMA LEFT FOREARM/  LEFT AXILLA LYMPH NODE BX  09-10-2004    There were no vitals filed for this visit.  Subjective Assessment - 03/15/19 1307    Subjective   picking up things from floor and getting things out of pocket are difficult with L hand.  Worries about slowing progression.    Pertinent History  Parkinson's disease (03/2017).  PMH:  arthritis, hx of malignant melanoma, HDL, HTN, Peripheral vascular disease, hx of L shoudler arthroscopy 2011    Patient Stated Goals  improve ADLs, prevent future problems    Currently in Pain?  No/denies          CLINIC OPERATION CHANGES: [facility]patient [facility] is open at lower capacity following universal masking, social distancing, and patient screening.  The patient's COVID risk of complications score is 3.  Placing grooved pegs in pegboard with L hand with min difficulty with use of PWR! Hands, deliberate movements with min v.c.  Removing with PWR! Hands with min v.c.  PWR! Multi-directional Movements:   Stepping to given sequence with min  difficulty/cues for dual task/cognitive component.  Then multi-directional reaching to given sequence with min difficulty/cues for dual task/cognitive component.  Followed by multi-directional reaching with stepping with min difficulty/cues for dual task/cognitive component.    Then repeated reaching and stepping while calling out given colors with min difficulties/cues for dual task/additional cognitive component.  All improved with repetition.    Standing, practicing reaching to pick up coins off of the floor utilizing strategy (feet apart, 1 foot in front, open hand).  Pt returned demo with good  success.  Manipulating coins in hand to pass coins from one hand to the other with min cueing for large amplitude movements.  Then counting money from palm.  Recommended holding in R palm to pick out coin with L hand due to difficulty with supination L hand.  Pt with improved success. Practiced removing keys from L pocket and manipulating with L hand to locate house key and position for use with min cueing for large amplitude movement strategies.       OT Education - 03/15/19 1404    Education Details  ways to incr difficulty of HEP/progress HEP including adding cognitive components as appropriate, how to incorporate coordination exercises into walking/daily activities    Person(s) Educated  Patient    Methods  Explanation;Demonstration    Comprehension  Verbalized understanding        OT Short Term Goals - 03/13/19 0727      OT SHORT TERM GOAL #1   Title  Pt will be independent with PD-specific HEP--check STGs 03/15/19    Time  4    Period  Weeks    Status  Achieved      OT SHORT TERM GOAL #2   Title  Pt will verbalize understanding of ways to decr risk for future complications related to PD.    Time  4    Period  Weeks    Status  Achieved        OT Long Term Goals - 03/08/19 1410      OT LONG TERM GOAL #1   Title  Pt will verbalize understanding of adaptive strategies to incr ease/efficiency with ADLs/IADLs.--check LTGs 03/30/19    Time  6    Period  Weeks    Status  New      OT LONG TERM GOAL #2   Title  Pt will improve functional reaching/coordination for ADLs as shown by improving score on box and blocks test by at least 4 bilaterally.    Baseline  R-43, L-41 blocks    Time  6    Period  Weeks    Status  New      OT LONG TERM GOAL #3   Title  Pt will improve L hand coordination for ADLs as shown by improving time on 9-hole peg test by at least 5sec.    Baseline  L-48.81sec    Time  6    Period  Weeks    Status  New      OT LONG TERM GOAL #4   Title  Pt will  improve bilateral hand coordination as shown by fastening/unfastening 3 buttons in 30sec or less.    Baseline  33.59sec    Time  6    Period  Weeks            Plan - 03/15/19 1307    Clinical Impression Statement  Pt continues to make good progress towards goals, verbalizes understanding of education provided, demo improved L hand manipulation  today.    OT Occupational Profile and History  Detailed Assessment- Review of Records and additional review of physical, cognitive, psychosocial history related to current functional performance    Occupational performance deficits (Please refer to evaluation for details):  ADL's;IADL's;Leisure;Social Participation    Body Structure / Function / Physical Skills  ADL;Dexterity;ROM;IADL;Balance;Mobility;Coordination;FMC;Tone;UE functional use;Decreased knowledge of use of DME;GMC;Endurance;Improper spinal/pelvic alignment    Rehab Potential  Good    Clinical Decision Making  Several treatment options, min-mod task modification necessary    Comorbidities Affecting Occupational Performance:  May have comorbidities impacting occupational performance    Modification or Assistance to Complete Evaluation   Min-Moderate modification of tasks or assist with assess necessary to complete eval    OT Frequency  2x / week    OT Duration  6 weeks   +eval (or 13 visits)   OT Treatment/Interventions  Self-care/ADL training;Moist Heat;Fluidtherapy;DME and/or AE instruction;Therapeutic activities;Aquatic Therapy;Therapeutic exercise;Functional Mobility Training;Neuromuscular education;Cryotherapy;Energy conservation;Manual Therapy;Patient/family education    Plan  ADL stratgies (buttoning, ?donning jacket), work towards goals, in hand manipulation/coordination L hand    OT Home Exercise Plan  Education provided:  Coordination HEP with focus on large amplitude, PWR! hands (basic 4)    Consulted and Agree with Plan of Care  Patient       Patient will benefit from  skilled therapeutic intervention in order to improve the following deficits and impairments:   Body Structure / Function / Physical Skills: ADL, Dexterity, ROM, IADL, Balance, Mobility, Coordination, FMC, Tone, UE functional use, Decreased knowledge of use of DME, GMC, Endurance, Improper spinal/pelvic alignment       Visit Diagnosis: 1. Other symptoms and signs involving the nervous system   2. Other lack of coordination   3. Other symptoms and signs involving the musculoskeletal system   4. Stiffness of left shoulder, not elsewhere classified   5. Stiffness of right shoulder, not elsewhere classified   6. Tremor   7. Abnormal posture   8. Other abnormalities of gait and mobility       Problem List Patient Active Problem List   Diagnosis Date Noted  . Parkinson's disease ([place]) 04/13/2017  . BPH (benign prostatic hypertrophy) with urinary retention 09/30/2015    [facility] 03/16/2019, 8:36 AM  [facility] [address] [place], [place], [address] Phone: [phone]   Fax:  [phone]  Name: Zachary Wang. MRN: [id number] Date of Birth: Feb 20, 1941   Vianne Bulls, OTR/L [facility] [address] [place], [place]  [address] [phone] phone [phone] 03/16/19 8:36 AM

## 2019-03-28 ENCOUNTER — Ambulatory Visit: Payer: No Typology Code available for payment source | Attending: Internal Medicine | Admitting: Occupational Therapy

## 2019-03-28 ENCOUNTER — Other Ambulatory Visit: Payer: Self-pay

## 2019-03-28 ENCOUNTER — Encounter: Payer: Self-pay | Admitting: Occupational Therapy

## 2019-03-28 DIAGNOSIS — R29898 Other symptoms and signs involving the musculoskeletal system: Secondary | ICD-10-CM | POA: Diagnosis present

## 2019-03-28 DIAGNOSIS — G20 Parkinson's disease: Secondary | ICD-10-CM | POA: Insufficient documentation

## 2019-03-28 DIAGNOSIS — R293 Abnormal posture: Secondary | ICD-10-CM | POA: Diagnosis present

## 2019-03-28 DIAGNOSIS — M25612 Stiffness of left shoulder, not elsewhere classified: Secondary | ICD-10-CM | POA: Diagnosis present

## 2019-03-28 DIAGNOSIS — R278 Other lack of coordination: Secondary | ICD-10-CM

## 2019-03-28 DIAGNOSIS — R29818 Other symptoms and signs involving the nervous system: Secondary | ICD-10-CM

## 2019-03-28 DIAGNOSIS — R251 Tremor, unspecified: Secondary | ICD-10-CM

## 2019-03-28 DIAGNOSIS — R2689 Other abnormalities of gait and mobility: Secondary | ICD-10-CM | POA: Diagnosis present

## 2019-03-28 DIAGNOSIS — G20A1 Parkinson's disease without dyskinesia, without mention of fluctuations: Secondary | ICD-10-CM

## 2019-03-28 NOTE — Therapy (Signed)
[facility] [address] [place], [place], [address] Phone: [phone]   Fax:  [phone]  Occupational Therapy Treatment  Patient Details  Name: Zachary Wang. MRN: [id number] Date of Birth: 08/30/1940 Referring Provider (OT): Dr. Wells Guiles Tat   Encounter Date: 03/28/2019  OT End of Session - 03/28/19 0858    Visit Number  8    Number of Visits  13    Date for OT Re-Evaluation  03/30/19    Authorization Type  [employer] Medicare 20% aived 5/11-9/30/20 ([place] approved OT for up to 15 visits including eval)    Authorization Time Period  Medicare cert. period 02/13/19-05/14/19    Authorization - Visit Number  8   Medicare PN visits   Authorization - Number of Visits  10    OT Start Time  [phone]    OT Stop Time  0932    OT Time Calculation (min)  38 min    Activity Tolerance  Patient tolerated treatment well    Behavior During Therapy  [facility] for tasks assessed/performed       Past Medical History:  Diagnosis Date  . Arthritis    hands  . BPH (benign prostatic hyperplasia)   . Elevated PSA   . Foley catheter in place   . History of adenomatous polyp of colon    2014  tubular adenoma  . History of malignant melanoma    s/p  excision left forearm and left axill lymph node bx 09-10-2004  . Hyperlipidemia   . Hypertension   . Peripheral vascular disease ([place])   . Urinary retention     Past Surgical History:  Procedure Laterality Date  . ANAL FISSURE REPAIR  1984  . COLONOSCOPY N/A 05/02/2013   Procedure: COLONOSCOPY;  Surgeon: Garlan Fair, MD;  Location: [facility];  Service: Endoscopy;  Laterality: N/A;  . INSERTION OF SUPRAPUBIC CATHETER N/A 09/30/2015   Procedure: INSERTION OF SUPRAPUBIC CATHETER;  Surgeon: Kathie Rhodes, MD;  Location: [facility];  Service: Urology;  Laterality: N/A;  . SHOULDER ARTHROSCOPY W/ SUBACROMIAL DECOMPRESSION AND DISTAL CLAVICLE EXCISION Left 05-20-2010   w/  Debridement  , Bursectomy, Acromioplasty,  Capsulectomy  . TRANSURETHRAL RESECTION OF PROSTATE N/A 09/30/2015   Procedure: TURP (TRANSURETHRAL RESECTION OF PROSTATE WITH GYRUS;  Surgeon: Kathie Rhodes, MD;  Location: [facility];  Service: Urology;  Laterality: N/A;  . WIDE EXCISION MALIGNANT MELANOMA LEFT FOREARM/  LEFT AXILLA LYMPH NODE BX  09-10-2004    There were no vitals filed for this visit.  Subjective Assessment - 03/28/19 0854    Subjective   doing well    Pertinent History  Parkinson's disease (03/2017).  PMH:  arthritis, hx of malignant melanoma, HDL, HTN, Peripheral vascular disease, hx of L shoudler arthroscopy 2011    Patient Stated Goals  improve ADLs, prevent future problems    Currently in Pain?  No/denies          Practiced buttoning/unbuttoning shirt on table top with min cues for use of PWR! Hands prior to buttoning and use of deliberate/large amplitude movements for fastening and push-pull technique for unfastening after instruction.  Pt demo improvement with repetition and mincues for use of large amplitude movements.   Standing to pick up object from the floor with big movement strategy for incr safety and ease.  Emphasized feet apart, with 1 foot in front, bending at knees with weight on heels, and using PWR! Hand/reach.  Improved with min cueing/repetition.  Reviewed importance of incorporating large amplitude movements into daily activities.        OT Education - 03/28/19 [phone]    Education Details  Additions to Coordination HEP--activities for incr difficulty    Person(s) Educated  Patient    Methods  Explanation;Demonstration;Handout;Verbal cues    Comprehension  Verbalized understanding;Returned demonstration;Verbal cues required       OT Short Term Goals - 03/13/19 0727      OT SHORT TERM GOAL #1   Title  Pt will be independent with PD-specific HEP--check STGs 03/15/19    Time  4    Period  Weeks    Status  Achieved      OT SHORT TERM GOAL  #2   Title  Pt will verbalize understanding of ways to decr risk for future complications related to PD.    Time  4    Period  Weeks    Status  Achieved        OT Long Term Goals - 03/28/19 0907      OT LONG TERM GOAL #1   Title  Pt will verbalize understanding of adaptive strategies to incr ease/efficiency with ADLs/IADLs.--check LTGs 03/30/19    Time  6    Period  Weeks    Status  New      OT LONG TERM GOAL #2   Title  Pt will improve functional reaching/coordination for ADLs as shown by improving score on box and blocks test by at least 4 bilaterally.    Baseline  R-43, L-41 blocks    Time  6    Period  Weeks    Status  Not Met   03/28/19: 45 blocks, L-41blocks     OT LONG TERM GOAL #3   Title  Pt will improve L hand coordination for ADLs as shown by improving time on 9-hole peg test by at least 5sec.    Baseline  L-48.81sec    Time  6    Period  Weeks    Status  New      OT LONG TERM GOAL #4   Title  Pt will improve bilateral hand coordination as shown by fastening/unfastening 3 buttons in 30sec or less.    Baseline  33.59sec    Time  6    Period  Weeks    Status  Achieved   03/28/19:  27.29sec           Plan - 03/28/19 0859    Clinical Impression Statement  Pt continues to make good progress towards goals.  Check remaining goals next session.    OT Occupational Profile and History  Detailed Assessment- Review of Records and additional review of physical, cognitive, psychosocial history related to current functional performance    Occupational performance deficits (Please refer to evaluation for details):  ADL's;IADL's;Leisure;Social Participation    Body Structure / Function / Physical Skills  ADL;Dexterity;ROM;IADL;Balance;Mobility;Coordination;FMC;Tone;UE functional use;Decreased knowledge of use of DME;GMC;Endurance;Improper spinal/pelvic alignment    Rehab Potential  Good    Clinical Decision Making  Several treatment options, min-mod task modification  necessary    Comorbidities Affecting Occupational Performance:  May have comorbidities impacting occupational performance    Modification or Assistance to Complete Evaluation   Min-Moderate modification of tasks or assist with assess necessary to complete eval    OT Frequency  2x / week    OT Duration  6 weeks   +eval (or 13 visits)   OT Treatment/Interventions  Self-care/ADL training;Moist Heat;Fluidtherapy;DME and/or AE instruction;Therapeutic activities;Aquatic Therapy;Therapeutic  exercise;Functional Mobility Training;Neuromuscular education;Cryotherapy;Energy conservation;Manual Therapy;Patient/family education    Plan  review ADL strategies, in-hand manipulation/coordination, check remaining goals and anticipate d/c, schedule screens in approx 6 months.    OT Home Exercise Plan  Education provided:  Coordination HEP with focus on large amplitude, PWR! hands (basic 4)    Consulted and Agree with Plan of Care  Patient       Patient will benefit from skilled therapeutic intervention in order to improve the following deficits and impairments:   Body Structure / Function / Physical Skills: ADL, Dexterity, ROM, IADL, Balance, Mobility, Coordination, FMC, Tone, UE functional use, Decreased knowledge of use of DME, GMC, Endurance, Improper spinal/pelvic alignment       Visit Diagnosis: 1. Other symptoms and signs involving the nervous system   2. Other lack of coordination   3. Other symptoms and signs involving the musculoskeletal system   4. Stiffness of left shoulder, not elsewhere classified   5. Tremor   6. Abnormal posture   7. Other abnormalities of gait and mobility   8. Parkinson's disease [facility])       Problem List Patient Active Problem List   Diagnosis Date Noted  . Parkinson's disease ([place]) 04/13/2017  . BPH (benign prostatic hypertrophy) with urinary retention 09/30/2015    [facility] 03/28/2019, 12:57 PM  [facility] [address] [place], [place], [address] Phone: [phone]   Fax:  [phone]  Name: Duwayne Matters. MRN: [id number] Date of Birth: 1941/08/02   Vianne Bulls, OTR/L [facility] [address] [place], [place]  [address] [phone] phone [phone] 03/28/19 12:57 PM

## 2019-03-28 NOTE — Patient Instructions (Signed)
   Coordination Exercises  Perform the following exercises for 20 minutes 1 times per day. Perform with both hand(s). Perform using big movements.   Flip card between each finger.  Place card on tabletop. Then flick fingers (extend fingers) powerfully to slide card off table (can have chair/box below table to catch the cards).  Juggle 2 balls: Do not go fast. Pause after each toss.  Hold end of small trash/produce bag in fingers and pull bag into palm by extending fingers big

## 2019-03-31 ENCOUNTER — Other Ambulatory Visit: Payer: Self-pay

## 2019-03-31 ENCOUNTER — Ambulatory Visit: Payer: No Typology Code available for payment source | Admitting: Occupational Therapy

## 2019-03-31 ENCOUNTER — Encounter: Payer: Self-pay | Admitting: Occupational Therapy

## 2019-03-31 DIAGNOSIS — R29898 Other symptoms and signs involving the musculoskeletal system: Secondary | ICD-10-CM

## 2019-03-31 DIAGNOSIS — R2689 Other abnormalities of gait and mobility: Secondary | ICD-10-CM

## 2019-03-31 DIAGNOSIS — R29818 Other symptoms and signs involving the nervous system: Secondary | ICD-10-CM | POA: Diagnosis not present

## 2019-03-31 DIAGNOSIS — R251 Tremor, unspecified: Secondary | ICD-10-CM

## 2019-03-31 DIAGNOSIS — M25612 Stiffness of left shoulder, not elsewhere classified: Secondary | ICD-10-CM

## 2019-03-31 DIAGNOSIS — R293 Abnormal posture: Secondary | ICD-10-CM

## 2019-03-31 DIAGNOSIS — R278 Other lack of coordination: Secondary | ICD-10-CM

## 2019-03-31 NOTE — Therapy (Signed)
[facility] [address] [place], [place], [address] Phone: [phone]   Fax:  [phone]  Occupational Therapy Treatment  Patient Details  Name: Zachary Wang. MRN: [id number] Date of Birth: May 27, 1941 Referring Provider (OT): Dr. Wells Guiles Tat   Encounter Date: 03/31/2019  OT End of Session - 03/31/19 1020    Visit Number  9    Number of Visits  13    Date for OT Re-Evaluation  03/30/19    Authorization Type  [employer] Medicare 20% aived 5/11-9/30/20 ([place] approved OT for up to 15 visits including eval)    Authorization Time Period  Medicare cert. period 02/13/19-05/14/19    Authorization - Visit Number  9   Medicare PN visits   Authorization - Number of Visits  10    OT Start Time  6962    OT Stop Time  1100    OT Time Calculation (min)  42 min    Activity Tolerance  Patient tolerated treatment well    Behavior During Therapy  WFL for tasks assessed/performed       Past Medical History:  Diagnosis Date  . Arthritis    hands  . BPH (benign prostatic hyperplasia)   . Elevated PSA   . Foley catheter in place   . History of adenomatous polyp of colon    2014  tubular adenoma  . History of malignant melanoma    s/p  excision left forearm and left axill lymph node bx 09-10-2004  . Hyperlipidemia   . Hypertension   . Peripheral vascular disease ([place])   . Urinary retention     Past Surgical History:  Procedure Laterality Date  . ANAL FISSURE REPAIR  1984  . COLONOSCOPY N/A 05/02/2013   Procedure: COLONOSCOPY;  Surgeon: Garlan Fair, MD;  Location: [facility];  Service: Endoscopy;  Laterality: N/A;  . INSERTION OF SUPRAPUBIC CATHETER N/A 09/30/2015   Procedure: INSERTION OF SUPRAPUBIC CATHETER;  Surgeon: Kathie Rhodes, MD;  Location: [facility];  Service: Urology;  Laterality: N/A;  . SHOULDER ARTHROSCOPY W/ SUBACROMIAL DECOMPRESSION AND DISTAL CLAVICLE EXCISION Left 05-20-2010   w/  Debridement  , Bursectomy, Acromioplasty,  Capsulectomy  . TRANSURETHRAL RESECTION OF PROSTATE N/A 09/30/2015   Procedure: TURP (TRANSURETHRAL RESECTION OF PROSTATE WITH GYRUS;  Surgeon: Kathie Rhodes, MD;  Location: [facility];  Service: Urology;  Laterality: N/A;  . WIDE EXCISION MALIGNANT MELANOMA LEFT FOREARM/  LEFT AXILLA LYMPH NODE BX  09-10-2004    There were no vitals filed for this visit.  Subjective Assessment - 03/31/19 1019    Subjective   I worked with the golf balls    Pertinent History  Parkinson's disease (03/2017).  PMH:  arthritis, hx of malignant melanoma, HDL, HTN, Peripheral vascular disease, hx of L shoudler arthroscopy 2011    Patient Stated Goals  improve ADLs, prevent future problems    Currently in Pain?  No/denies        Reviewed updates to coordination HEP.  Pt returned demo with min-mod difficulty.  Checked remaining goals and discussed progress.            OT Education - 03/31/19 1128    Education Details  Reviewed ways to prevent future complications (including shoulder pain, visual deficits--looking while reaching, arm swing, posture, feet apart, etc);  Possible visual changes with PD and compensation strategies (night lights, incr contract, stair strips, etc); Recommendation for follow-up therapy screens (pt is getting email updates for community  resources)    Person(s) Educated  Patient    Methods  Explanation    Comprehension  Verbalized understanding       OT Short Term Goals - 03/13/19 0727      OT SHORT TERM GOAL #1   Title  Pt will be independent with PD-specific HEP--check STGs 03/15/19    Time  4    Period  Weeks    Status  Achieved      OT SHORT TERM GOAL #2   Title  Pt will verbalize understanding of ways to decr risk for future complications related to PD.    Time  4    Period  Weeks    Status  Achieved        OT Long Term Goals - 03/31/19 1026      OT LONG TERM GOAL #1   Title  Pt will verbalize understanding of  adaptive strategies to incr ease/efficiency with ADLs/IADLs.--check LTGs 03/30/19    Time  6    Period  Weeks    Status  Achieved      OT LONG TERM GOAL #2   Title  Pt will improve functional reaching/coordination for ADLs as shown by improving score on box and blocks test by at least 4 bilaterally.    Baseline  R-43, L-41 blocks    Time  6    Period  Weeks    Status  Not Met   03/28/19: 45 blocks, L-41blocks     OT LONG TERM GOAL #3   Title  Pt will improve L hand coordination for ADLs as shown by improving time on 9-hole peg test by at least 5sec.    Baseline  L-48.81sec    Time  6    Period  Weeks    Status  Achieved   03/31/19:  41.97sec, 40.35sec     OT LONG TERM GOAL #4   Title  Pt will improve bilateral hand coordination as shown by fastening/unfastening 3 buttons in 30sec or less.    Baseline  33.59sec    Time  6    Period  Weeks    Status  Achieved   03/28/19:  27.29sec           Plan - 03/31/19 1025    Clinical Impression Statement  Pt continues to make good progress and verbalizes understanding of education provided.    OT Occupational Profile and History  Detailed Assessment- Review of Records and additional review of physical, cognitive, psychosocial history related to current functional performance    Occupational performance deficits (Please refer to evaluation for details):  ADL's;IADL's;Leisure;Social Participation    Body Structure / Function / Physical Skills  ADL;Dexterity;ROM;IADL;Balance;Mobility;Coordination;FMC;Tone;UE functional use;Decreased knowledge of use of DME;GMC;Endurance;Improper spinal/pelvic alignment    Rehab Potential  Good    Clinical Decision Making  Several treatment options, min-mod task modification necessary    Comorbidities Affecting Occupational Performance:  May have comorbidities impacting occupational performance    Modification or Assistance to Complete Evaluation   Min-Moderate modification of tasks or assist with assess  necessary to complete eval    OT Frequency  2x / week    OT Duration  6 weeks   +eval (or 13 visits)   OT Treatment/Interventions  Self-care/ADL training;Moist Heat;Fluidtherapy;DME and/or AE instruction;Therapeutic activities;Aquatic Therapy;Therapeutic exercise;Functional Mobility Training;Neuromuscular education;Cryotherapy;Energy conservation;Manual Therapy;Patient/family education    Plan  d/c OT, schedule PD screens in approx 6 months (PT, OT, ST)    OT Home Exercise Plan  Education provided:  Coordination HEP with focus on large amplitude, PWR! hands (basic 4)    Consulted and Agree with Plan of Care  Patient       Patient will benefit from skilled therapeutic intervention in order to improve the following deficits and impairments:   Body Structure / Function / Physical Skills: ADL, Dexterity, ROM, IADL, Balance, Mobility, Coordination, FMC, Tone, UE functional use, Decreased knowledge of use of DME, GMC, Endurance, Improper spinal/pelvic alignment       Visit Diagnosis: 1. Other symptoms and signs involving the nervous system   2. Other lack of coordination   3. Other symptoms and signs involving the musculoskeletal system   4. Stiffness of left shoulder, not elsewhere classified   5. Tremor   6. Abnormal posture   7. Other abnormalities of gait and mobility       Problem List Patient Active Problem List   Diagnosis Date Noted  . Parkinson's disease ([place]) 04/13/2017  . BPH (benign prostatic hypertrophy) with urinary retention 09/30/2015    OCCUPATIONAL THERAPY DISCHARGE SUMMARY  Visits from Start of Care: 9  Current functional level related to goals / functional outcomes: See above   Remaining deficits: Bradykinesia, rigidity, decr coordination, abnormal posture, decr balance/functional mobility,  Tremors   Education / Equipment: Pt was instructed in the following:  PD-specific HEP, adaptive strategies for ADLs/IADLs, ways to prevent future complications.  Pt  verbalized understanding of all education provided.    Plan: Patient agrees to discharge.  Patient goals were not met. Patient is being discharged due to being pleased with the current functional level.  Pt would benefit from re-evaluation/occupational therapy screen in approx 6 months to assess for need for further therapy/functional changes due to progressive nature of diagnosis. ?????        [facility] 03/31/2019, 12:09 PM  [facility] [address] [place], [place], [address] Phone: [phone]   Fax:  [phone]  Name: Amaree Leeper. MRN: [id number] Date of Birth: [birth year]   Vianne Bulls, OTR/L [facility] [address] [place], [place]  [address] [phone] phone [phone] 03/31/19 12:09 PM

## 2019-05-15 NOTE — Progress Notes (Signed)
Virtual Visit via Video Note The purpose of this virtual visit is to provide medical care while limiting exposure to the novel coronavirus.    Consent was obtained for video visit:  Yes.   Answered questions that patient had about telehealth interaction:  Yes.   I discussed the limitations, risks, security and privacy concerns of performing an evaluation and management service by telemedicine. I also discussed with the patient that there may be a patient responsible charge related to this service. The patient expressed understanding and agreed to proceed.  Pt location: Home Physician Location: office Name of referring provider:  Prince Solian, MD I connected with Zachary Wang. at patients initiation/request on 05/17/2019 at  9:45 AM EDT by video enabled telemedicine application and verified that I am speaking with the correct person using two identifiers. Pt MRN:  [id number] Pt DOB:  14-Jan-1941 Video Participants:  Zachary Wang.;     History of Present Illness:  Patient is seen today in follow-up for Parkinson's disease.  Last visit, patient and I again talked about the importance of dosing his carbidopa/levodopa 25/100 at appropriate times and taking them at 2 tablets at 7 AM/11 AM and 1 tablet at 4 PM.  He reports that he is taking it close to those times.  He is also on pramipexole ER, 1.5 mg daily.  He is having hand cramping intermittently.  He had them yesterday but it was because he was "doing a project."  Some leg cramps at night but "its not consistent."  He has had no compulsive behaviors or sleep attacks.  No falls.  In regards to dizziness, he reports that he has that when he bends over to put the golf ball on the T.  He is still on norvasc, HCTZ and cozaar.  Saw pcp last week.  BP generally in the 130's - 140's.  More drooling.  Has become bothersome to him.   Current Outpatient Medications:  .  amLODipine (NORVASC) 5 MG tablet, Take 5 mg by mouth every morning., Disp:  , Rfl:  .  atorvastatin (LIPITOR) 20 MG tablet, Take 20 mg by mouth every evening. , Disp: , Rfl:  .  carbidopa-levodopa (SINEMET IR) 25-100 MG tablet, 2 at 7am, 2 at 11am, 1 at 4pm, Disp: 450 tablet, Rfl: 3 .  doxycycline (VIBRAMYCIN) 100 MG capsule, Take 100 mg by mouth 2 (two) times daily., Disp: , Rfl:  .  hydrochlorothiazide (HYDRODIURIL) 25 MG tablet, Take 25 mg by mouth every morning. , Disp: , Rfl:  .  losartan (COZAAR) 100 MG tablet, Take 50 mg by mouth 2 (two) times daily., Disp: , Rfl:  .  Pramipexole Dihydrochloride 1.5 MG TB24, Take 1 tablet (1.5 mg total) by mouth 1 day or 1 dose., Disp: 90 tablet, Rfl: 3    Observations/Objective:   Vitals:   05/17/19 0817  Weight: 225 lb (102.1 kg)  Height: 6\' 2"  (1.88 m)   GEN:  The patient appears stated age and is in NAD.  Neurological examination:  Orientation: The patient is alert and oriented x3. Cranial nerves: There is good facial symmetry. There is mild facial hypomimia.  The speech is fluent and clear. Soft palate rises symmetrically and there is no tongue deviation. Hearing is intact to conversational tone. Motor: Strength is at least antigravity x 4.   Shoulder shrug is equal and symmetric.  There is no pronator drift.  Movement examination: Tone: unable Abnormal movements: none Coordination:  There is  decremation  with RAM's, with finger taps, left more than right.  Was unable to see the feet well to assess heel and toe taps. Gait and Station:  The patient's stride length is slightly decreased with decreased arm swing on the left.  He does not shuffle.  He has a slightly stooped posture.    Lab work is received from primary care physician and dated November 29, 2018.  Sodium is 142, potassium 3.7, chloride 107, CO2 25, BUN 25, creatinine 1.3.  White blood cells are 5.3, hemoglobin 13.7, hematocrit 91.3 and platelets 155.  Assessment and Plan:   1.  Parkinson's disease, tremor predominant.  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  The patient has  tremor, bradykinesia, rigidity             -We discussed the diagnosis as well as pathophysiology of the disease.  We discussed treatment options as well as prognostic indicators.  Patient education was provided.             -We discussed that it used to be thought that levodopa would increase risk of melanoma but now it is believed that Parkinsons itself likely increases risk of melanoma. he is to get regular skin checks.             -continue carbidopa/levodopa 25/100, 2 tablets at 7 AM/2 tablets at 11 AM, 1 tablet at 4 PM.  -Discussed whether we should add an additional carbidopa/levodopa CR at bedtime for cramping, or even add entacapone for cramping in the day.  Ultimately, the patient states that it is very intermittent and he wants to hold on that.             -Continue pramipexole ER, 1.5 mg daily.              -he finished his process for disability via the [place] [facility].  He is getting his meds via the [place] now.    2.  History of malignant melanoma             -He is following regularly with dermatology.  3.  Dizziness             -Primary care and I are monitoring meds closely.  He is on multiple different blood pressure medications.  He just saw his primary care and mentioned the dizziness to him as well.  Unfortunately, he is in that strange state where blood pressure is in the mid to high range, but he has some orthostasis when he bends.  Talked about making sure that he is drinking plenty of water and getting up slowly.  Primary care is following him closely as well.  4.  Sialorrhea  -This is commonly associated with PD.  We talked about treatments.  The patient is not a candidate for oral anticholinergic therapy because of increased risk of confusion and falls.  We discussed Botox (type A and B) and 1% atropine drops.  We discusssed that candy like lemon drops can help by stimulating mm of the oropharynx to induce swallowing.  He has already tried the sour candy.  He is interested  in the Botox.   Follow Up Instructions: F/u in 5 months.  Told him I would like to see him back in person next time.    -I discussed the assessment and treatment plan with the patient. The patient was provided an opportunity to ask questions and all were answered. The patient agreed with the plan and demonstrated an understanding of the instructions.   The patient  was advised to call back or seek an in-person evaluation if the symptoms worsen or if the condition fails to improve as anticipated.    Total Time spent in visit with the patient was:  25 min, of which more than 50% of the time was spent in counseling and/or coordinating care on safety.   Pt understands and agrees with the plan of care outlined.     Alonza Bogus, DO

## 2019-05-17 ENCOUNTER — Other Ambulatory Visit: Payer: Self-pay

## 2019-05-17 ENCOUNTER — Encounter: Payer: Self-pay | Admitting: Neurology

## 2019-05-17 ENCOUNTER — Telehealth (INDEPENDENT_AMBULATORY_CARE_PROVIDER_SITE_OTHER): Payer: No Typology Code available for payment source | Admitting: Neurology

## 2019-05-17 VITALS — Ht 74.0 in | Wt 225.0 lb

## 2019-05-17 DIAGNOSIS — K117 Disturbances of salivary secretion: Secondary | ICD-10-CM | POA: Diagnosis not present

## 2019-05-17 DIAGNOSIS — G20 Parkinson's disease: Secondary | ICD-10-CM

## 2019-05-25 NOTE — Progress Notes (Signed)
Received BV from Ilda Foil [id number] Case# [id number]  Comments: HCP has option to buy & bill or utilize the [facility] network. The preferred SP is [facility] [place]. Please see the details in the in network specialty pharmacy section on the left hand side of the form.  [place] [phone]) is covered under the medical benefits  The administration code CPT Code [phone] is covered under the medication benefits accompanied by diagnosis code K11.7  PA is not required for [facility] [phone])  Office based administration of MYOBLOC is a covered service when it is reason and medically necessary for the diagnosis/ treatment of the patients condition.  Supporting documents may be requested after the claim is received  Call reference: [phone] option 3

## 2019-07-11 ENCOUNTER — Other Ambulatory Visit: Payer: Self-pay

## 2019-07-11 DIAGNOSIS — Z20822 Contact with and (suspected) exposure to covid-19: Secondary | ICD-10-CM

## 2019-07-13 LAB — NOVEL CORONAVIRUS, NAA: SARS-CoV-2, NAA: NOT DETECTED

## 2019-07-18 ENCOUNTER — Encounter: Payer: Self-pay | Admitting: Neurology

## 2019-08-04 ENCOUNTER — Ambulatory Visit: Payer: No Typology Code available for payment source | Admitting: Neurology

## 2019-08-17 ENCOUNTER — Other Ambulatory Visit: Payer: Self-pay

## 2019-08-17 ENCOUNTER — Ambulatory Visit (INDEPENDENT_AMBULATORY_CARE_PROVIDER_SITE_OTHER): Payer: Medicare Other | Admitting: Neurology

## 2019-08-17 DIAGNOSIS — K117 Disturbances of salivary secretion: Secondary | ICD-10-CM | POA: Diagnosis not present

## 2019-08-17 MED ORDER — RIMABOTULINUMTOXINB 5000 UNIT/ML IM SOLN
5000.0000 [IU] | Freq: Once | INTRAMUSCULAR | Status: AC
  Administered 2019-08-17: 11:00:00 5000 [IU] via INTRAMUSCULAR

## 2019-08-17 NOTE — Procedures (Signed)
Botulinum Clinic    History:  Diagnosis: Sialorrhea    Result History  Onset of effect: n/a Duration of Benefit: n/a Adverse Effects: n/a  Consent obtained from: The patient The patient was educated on the botulinum toxin the black blox warning and given a copy of the botox patient medication guide.  The patient understands that this warning states that there have been reported cases of the Botox extending beyond the injection site and creating adverse effects, similar to those of botulism. This included loss of strength, trouble walking, hoarseness, trouble saying words clearly, loss of bladder control, trouble breathing, trouble swallowing, diplopia, blurry vision and ptosis. Most of the distant spread of Botox was happening in patients, primarily children, who received medication for spasticity or for cervical dystonia. The patient expressed understanding and desire to proceed.     Injections  Location Left  Right Units Number of sites  Submandibular gland 250 250 500 1 per side  Parotid 2250 2250 2500 1 per side  TOTAL UNITS:     5000      Type of Toxin: Myobloc type B As ordered and injected IM at today's visit Total Units: 5000  Discarded Units: 0  Needle drawback with each injection was free of blood. Pt tolerated procedure well without complications.   Reinjection is anticipated in 3 months.                     

## 2019-09-01 ENCOUNTER — Ambulatory Visit: Payer: Self-pay | Admitting: *Deleted

## 2019-09-01 NOTE — Telephone Encounter (Signed)
Summary: Vaccine Questions   Pt stated he received his first vaccine on [facility]. They were going to schedule his 2nd vaccine exactly 21 days later but the pt is traveling. They told him that it had to be exactly 21 days and there was no grace period for this. He would like to know if this is true and also if he had the first vaccine with the [facility], could he get the 2nd at the [facility] or another location. Would like to discuss with a nurse      Call to patient- per literature- there is a 4 day grace period where he could get his shot 2 days early. Patient will call to see when the schedules are going to open back up for scheduling future appointments.Reminded patient to bring vaccine card and schedule for correct vaccine follow up.  Reason for Disposition . General information question, no triage required and triager able to answer question  Protocols used: [place]

## 2019-09-09 ENCOUNTER — Ambulatory Visit: Payer: No Typology Code available for payment source

## 2019-09-19 ENCOUNTER — Ambulatory Visit: Payer: Medicare Other | Attending: Internal Medicine

## 2019-09-19 ENCOUNTER — Ambulatory Visit: Payer: No Typology Code available for payment source

## 2019-09-19 DIAGNOSIS — Z23 Encounter for immunization: Secondary | ICD-10-CM

## 2019-09-19 NOTE — Progress Notes (Signed)
   [id number]  Name:  Devyon Loudin.    MRN: [id number] DOB: 1941-02-11  09/19/2019  Mr. Trimmer was observed post Covid-19 immunization for 15 minutes without incidence. He was provided with Vaccine Information Sheet and instruction to access the V-Safe system.   Mr. Alkire was instructed to call 911 with any severe reactions post vaccine: Marland Kitchen Difficulty breathing  . Swelling of your face and throat  . A fast heartbeat  . A bad rash all over your body  . Dizziness and weakness    Immunizations Administered    Name Date Dose VIS Date Route   Pfizer COVID-19 Vaccine 09/19/2019  8:20 AM 0.3 mL 07/28/2019 Intramuscular   Manufacturer: [place]   Lot: [id number]   [place]: [id number]

## 2019-09-28 ENCOUNTER — Ambulatory Visit: Payer: No Typology Code available for payment source | Attending: Internal Medicine | Admitting: Physical Therapy

## 2019-09-28 ENCOUNTER — Ambulatory Visit: Payer: No Typology Code available for payment source

## 2019-09-28 ENCOUNTER — Ambulatory Visit: Payer: No Typology Code available for payment source | Admitting: Occupational Therapy

## 2019-09-28 ENCOUNTER — Other Ambulatory Visit: Payer: Self-pay

## 2019-09-28 DIAGNOSIS — R471 Dysarthria and anarthria: Secondary | ICD-10-CM | POA: Insufficient documentation

## 2019-09-28 DIAGNOSIS — R2689 Other abnormalities of gait and mobility: Secondary | ICD-10-CM | POA: Insufficient documentation

## 2019-09-28 DIAGNOSIS — R278 Other lack of coordination: Secondary | ICD-10-CM | POA: Insufficient documentation

## 2019-09-28 NOTE — Therapy (Signed)
[facility] [address] [place], [place], [address] Phone: [phone]   Fax:  [phone]  Patient Details  Name: Zachary Wang. MRN: [id number] Date of Birth: [birth year] Referring Provider:  Prince Solian, MD  Encounter Date: 09/28/2019 . Occupational Therapy Parkinson's Disease Scr  Fastening/unfastening 3 buttons in:  45.97sec  9-hole peg test:    RUE  28.69 sec        LUE  41.72 sec  Change in ability to perform ADLs/IADLs:  no  Other Comments:  Handwriting is legible and good letter size.    Pt does not require occupational therapy services at this time.  Recommended occupational therapy screen in   6 mons [name] 09/28/2019, 11:36 AM  [name] [address] [place], [place], [address] Phone: [phone]   Fax:  [phone]

## 2019-09-28 NOTE — Therapy (Signed)
[facility] [address] [place], [place], [address] Phone: [phone]   Fax:  [phone]  Patient Details  Name: Zachary Wang. MRN: [id number] Date of Birth: 20-Nov-1940 Referring Provider:  Prince Solian, MD  Encounter Date: 09/28/2019  Physical Therapy Parkinson's Disease Screen   Timed Up and Go test: 10.10 sec  10 meter walk test: 8.63 sec = 3.8 ft/sec  5 time sit to stand test: 13.44 sec   Patient does not require Physical Therapy services at this time.  Recommend Physical Therapy screen in 6 months.     [place]baldo Mark W. 09/28/2019, 8:28 AM  Frazier Butt., PT   [facility] [address] [place], [place], [address] Phone: [phone]   Fax:  [phone]

## 2019-09-28 NOTE — Therapy (Signed)
[facility] [address] [place], [place], [address] Phone: [phone]   Fax:  [phone]  Patient Details  Name: Zachary Wang. MRN: [id number] Date of Birth: 08/28/1940 Referring Provider:  Alonza Bogus, DO  Encounter Date: 09/28/2019  Speech Therapy Parkinson's Disease Screen   Decibel level in conversational speech today: mid-upper 60s dB  (WNL=70-72 dB) with sound level meter 30cm away from pt's mouth. Pt's conversational volume is lower than normal, and studies show early courses in therapies have more benefit in the long-term than waiting until acute difficulties arise.  Pt does not report difficulty in swallowing warranting further evaluation at this time.  Pt would benefit from speech-language eval for dysarthria - please order via Epic if agreed.   [facility] ,[place], [place]  09/28/2019, 8:24 AM  [facility] [address] [place], [place], [address] Phone: [phone]   Fax:  [phone]

## 2019-09-29 ENCOUNTER — Telehealth: Payer: Self-pay

## 2019-09-29 DIAGNOSIS — R490 Dysphonia: Secondary | ICD-10-CM

## 2019-09-29 DIAGNOSIS — G20 Parkinson's disease: Secondary | ICD-10-CM

## 2019-09-29 NOTE — Telephone Encounter (Signed)
Request for ST orders received and orders entered

## 2019-10-17 NOTE — Progress Notes (Signed)
Virtual Visit via Video Note The purpose of this virtual visit is to provide medical care while limiting exposure to the novel coronavirus.    Consent was obtained for video visit:  Yes.   Answered questions that patient had about telehealth interaction:  Yes.   I discussed the limitations, risks, security and privacy concerns of performing an evaluation and management service by telemedicine. I also discussed with the patient that there may be a patient responsible charge related to this service. The patient expressed understanding and agreed to proceed.  Pt location: Home Physician Location: office Name of referring provider:  Prince Solian, MD I connected with Thelma Comp. at patients initiation/request on 10/18/2019 at 11:15 AM EST by video enabled telemedicine application and verified that I am speaking with the correct person using two identifiers. Pt MRN:  [id number] Pt DOB:  16-Jun-1941 Video Participants:  Thelma Comp.;     History of Present Illness:  Patient seen today in follow-up for Parkinson's disease.  Patient has sialorrhea associated with Parkinson's disease.  Underwent his first Botox injections for that on December 31.  Patient reports that it is working well - less than a week to kick in.  Pt denies falls.  Pt denies lightheadedness, near syncope.  No hallucinations.  Mood has been good.  My previous records as well as any outside records made available were reviewed prior to todays visit.  Patient did go to his physical/occupational/speech therapy screens on February 11.  Only speech therapy was recommended.  He would like a RX for that to take to the [place] [facility]  Current movement d/o meds:  Carbidopa/levodopa 25/100, 2 tablets at 7 AM/2 tablets at 11 AM/1 tablet at 4 PM Pramipexole ER, 1.5 mg daily   Current Outpatient Medications on File Prior to Visit  Medication Sig Dispense Refill  . amLODipine (NORVASC) 5 MG tablet Take 5 mg by mouth every  morning.    Marland Kitchen atorvastatin (LIPITOR) 20 MG tablet Take 20 mg by mouth every evening.     . carbidopa-levodopa (SINEMET IR) 25-100 MG tablet 2 at 7am, 2 at 11am, 1 at 4pm 450 tablet 3  . hydrochlorothiazide (HYDRODIURIL) 25 MG tablet Take 25 mg by mouth every morning.     Marland Kitchen losartan (COZAAR) 100 MG tablet Take 50 mg by mouth 2 (two) times daily.    Marland Kitchen moxifloxacin (VIGAMOX) 0.5 % ophthalmic solution 1 drop in right eye four times a day 2 days before surgery    . Pramipexole Dihydrochloride 1.5 MG TB24 Take 1 tablet (1.5 mg total) by mouth 1 day or 1 dose. (Patient taking differently: Take 1 tablet by mouth daily. ) 90 tablet 3  . DUREZOL 0.05 % EMUL Place 1 drop into the right eye. 1 drop in right eye four times a day 2 days before surgery     No current facility-administered medications on file prior to visit.     Observations/Objective:   Vitals:   10/18/19 0856  BP: (!) 159/76  Pulse: (!) 51  Weight: 220 lb (99.8 kg)  Height: 6\' 2"  (1.88 m)   GEN:  The patient appears stated age and is in NAD.  Neurological examination:  Orientation: The patient is alert and oriented x3. Cranial nerves: There is good facial symmetry. There is facial hypomimia.  The speech is fluent and clear. Soft palate rises symmetrically and there is no tongue deviation. Hearing is intact to conversational tone. Motor: Strength is at least antigravity  x 4.   Shoulder shrug is equal and symmetric.  There is no pronator drift.  Movement examination: Tone: unable Abnormal movements: none seen but arms not seen at rest Coordination:  There is no decremation with RAM's, with any form of RAMS, including alternating supination and pronation of the forearm, hand opening and closing, finger taps Gait and Station: The patient has no difficulty arising out of a deep-seated chair without the use of the hands. The patient's stride length is good but he was partially out of the video and wasn't well visualized.       Assessment and Plan:   1.  Parkinsons Disease  -Continue carbidopa/levodopa 25/100, 2/2/1  -Continue pramipexole ER, 1.5 mg daily  -add in CV exercise.  Will have Sarah send zoom link for online Parkinsons Disease biking  -pt considering participating in [facility] genetic Parkinsons Disease study  -[place] for the [place] [facility] 2.  History of malignant melanoma  -We discussed that it used to be thought that levodopa would increase risk of melanoma but now it is believed that Parkinsons itself likely increases risk of melanoma. he is to get regular skin checks for this reason, as well as the fact that he has already had a history of melanoma.  He does regularly follow with dermatology. 3.  Dizziness  -He is on multiple blood pressure medications.  Primary care is monitoring due to the fact that patient is experiencing ups and downs in blood pressure, including orthostasis when he bends over. 4.  Sialorrhea  -Had first Botox injections on December 31.  Is scheduled for reinjection.  Found it to be very beneficial  Follow Up Instructions:    -I discussed the assessment and treatment plan with the patient. The patient was provided an opportunity to ask questions and all were answered. The patient agreed with the plan and demonstrated an understanding of the instructions.   The patient was advised to call back or seek an in-person evaluation if the symptoms worsen or if the condition fails to improve as anticipated.    Total time spent on today's visit was 27minutes, including both face-to-face time and nonface-to-face time.  Time included that spent on review of records (prior notes available to me/labs/imaging if pertinent), discussing treatment and goals, answering patient's questions and coordinating care.   Alonza Bogus, DO

## 2019-10-18 ENCOUNTER — Telehealth (INDEPENDENT_AMBULATORY_CARE_PROVIDER_SITE_OTHER): Payer: Medicare Other | Admitting: Neurology

## 2019-10-18 ENCOUNTER — Encounter: Payer: Self-pay | Admitting: Neurology

## 2019-10-18 ENCOUNTER — Telehealth: Payer: Self-pay

## 2019-10-18 ENCOUNTER — Telehealth: Payer: Self-pay | Admitting: Clinical

## 2019-10-18 ENCOUNTER — Other Ambulatory Visit: Payer: Self-pay

## 2019-10-18 VITALS — BP 159/76 | HR 51 | Ht 74.0 in | Wt 220.0 lb

## 2019-10-18 DIAGNOSIS — G20 Parkinson's disease: Secondary | ICD-10-CM

## 2019-10-18 DIAGNOSIS — Z85820 Personal history of malignant melanoma of skin: Secondary | ICD-10-CM

## 2019-10-18 DIAGNOSIS — K117 Disturbances of salivary secretion: Secondary | ICD-10-CM

## 2019-10-18 DIAGNOSIS — G20A1 Parkinson's disease without dyskinesia, without mention of fluctuations: Secondary | ICD-10-CM

## 2019-10-18 NOTE — Telephone Encounter (Signed)
LCSW provided information about Parkinson's exercise programs to pt

## 2019-10-18 NOTE — Telephone Encounter (Signed)
Left message on patients voicemail informing him that I would mail him a copy of the referral since I can not attach it to his mychart.

## 2019-10-20 ENCOUNTER — Telehealth: Payer: Self-pay

## 2019-10-20 DIAGNOSIS — G20 Parkinson's disease: Secondary | ICD-10-CM

## 2019-10-20 NOTE — Telephone Encounter (Signed)
Informed patient that I was sending his referral over and he should hear from them soon. He voiced understanding.

## 2019-10-20 NOTE — Addendum Note (Signed)
Addended by: Ulice Brilliant T on: 10/20/2019 04:17 PM   Modules accepted: Orders

## 2019-10-20 NOTE — Telephone Encounter (Signed)
Patient left message on my voicemail stating he had questions about his speech referral.   Left message to return my call.

## 2019-10-20 NOTE — Telephone Encounter (Signed)
Spoke with patient he stated the authorization paper he pick up was confusing. I explained to patient that we normally just send the referral over. And the place of referral contacts their insurance and set up an appointment. Patient voiced Zachary Wang and stated he misunderstood and he just wants to make sure that the [place] pays for his therapy.  I advised patient that I would send the referral over and the therapy place will be in contact with him. He voiced understanding.

## 2019-11-09 ENCOUNTER — Encounter: Payer: Self-pay | Admitting: *Deleted

## 2019-11-09 NOTE — Progress Notes (Addendum)
Submitted benefit verification and PA today via [employer] Myobloc website waiting for response  Comments: *HCP has the option to buy and bill or utilize the [employer]. The preferred SP is [employer]  [facility]/[place] [facility], prison and probation services. Please see the details in "[place]"  section on the left-hand side of the form.  *[place] [phone]) is covered under the medical benefit.  *The administration code CPT Code [id number] is covered under the medical benefit, accompanied by  diagnosis code [place].  *Prior authorization is not required for [facility] [phone]). *Office based administration of MYOBLOC is a covered service when it is reasonable and  medically necessary for the diagnosis/treatment of the patient's condition.  *Supporting documents may be requested after the claim is received.

## 2019-11-16 ENCOUNTER — Ambulatory Visit (INDEPENDENT_AMBULATORY_CARE_PROVIDER_SITE_OTHER): Payer: No Typology Code available for payment source | Admitting: Neurology

## 2019-11-16 ENCOUNTER — Other Ambulatory Visit: Payer: Self-pay

## 2019-11-16 DIAGNOSIS — K117 Disturbances of salivary secretion: Secondary | ICD-10-CM | POA: Diagnosis not present

## 2019-11-16 MED ORDER — RIMABOTULINUMTOXINB 5000 UNIT/ML IM SOLN
5000.0000 [IU] | Freq: Once | INTRAMUSCULAR | Status: AC
  Administered 2019-11-16: 12:00:00 5000 [IU] via INTRAMUSCULAR

## 2019-11-16 NOTE — Procedures (Signed)
Botulinum Clinic    History:  Diagnosis: Sialorrhea    Result History  Happy with results.  Just started to wear off  Consent obtained from: The patient The patient was educated on the botulinum toxin the black blox warning and given a copy of the botox patient medication guide.  The patient understands that this warning states that there have been reported cases of the Botox extending beyond the injection site and creating adverse effects, similar to those of botulism. This included loss of strength, trouble walking, hoarseness, trouble saying words clearly, loss of bladder control, trouble breathing, trouble swallowing, diplopia, blurry vision and ptosis. Most of the distant spread of Botox was happening in patients, primarily children, who received medication for spasticity or for cervical dystonia. The patient expressed understanding and desire to proceed.     Injections  Location Left  Right Units Number of sites  Submandibular gland 250 250 500 1 per side  Parotid 2250 2250 2500 1 per side  TOTAL UNITS:     5000      Type of Toxin: Myobloc type B As ordered and injected IM at today's visit Total Units: 5000  Discarded Units: 0  Needle drawback with each injection was free of blood. Pt tolerated procedure well without complications.   Reinjection is anticipated in 3 months.

## 2019-11-16 NOTE — Procedures (Deleted)
Botulinum Clinic    History:  Diagnosis: Sialorrhea    Result History    Consent obtained from: The patient The patient was educated on the botulinum toxin the black blox warning and given a copy of the botox patient medication guide.  The patient understands that this warning states that there have been reported cases of the Botox extending beyond the injection site and creating adverse effects, similar to those of botulism. This included loss of strength, trouble walking, hoarseness, trouble saying words clearly, loss of bladder control, trouble breathing, trouble swallowing, diplopia, blurry vision and ptosis. Most of the distant spread of Botox was happening in patients, primarily children, who received medication for spasticity or for cervical dystonia. The patient expressed understanding and desire to proceed.     Injections  Location Left  Right Units Number of sites  Submandibular gland 250 250 500 1 per side  Parotid 2250 2250 2500 1 per side  TOTAL UNITS:     5000      Type of Toxin: Myobloc type B As ordered and injected IM at today's visit Total Units: 5000  Discarded Units: 0  Needle drawback with each injection was free of blood. Pt tolerated procedure well without complications.   Reinjection is anticipated in 3 months.

## 2019-11-28 ENCOUNTER — Ambulatory Visit: Payer: No Typology Code available for payment source | Attending: Internal Medicine

## 2019-11-28 ENCOUNTER — Other Ambulatory Visit: Payer: Self-pay

## 2019-11-28 DIAGNOSIS — R471 Dysarthria and anarthria: Secondary | ICD-10-CM | POA: Diagnosis not present

## 2019-11-28 NOTE — Therapy (Signed)
[facility] [address] [place], [place], [address] Phone: [phone]   Fax:  [phone]  Speech Language Pathology Evaluation  Patient Details  Name: Zachary Wang. MRN: [id number] Date of Birth: Feb 24, 1941 Referring Provider (SLP): Alonza Bogus, DO   Encounter Date: 11/28/2019  End of Session - 11/28/19 0936    Visit Number  1    Number of Visits  16    Date for SLP Re-Evaluation  02/16/20    Authorization Type  [facility]    Authorization - Visit Number  0    Authorization - Number of Visits  15    SLP Start Time  0850    SLP Stop Time   0931    SLP Time Calculation (min)  41 min    Activity Tolerance  Patient tolerated treatment well       Past Medical History:  Diagnosis Date  . Arthritis    hands  . BPH (benign prostatic hyperplasia)   . Elevated PSA   . Foley catheter in place   . History of adenomatous polyp of colon    2014  tubular adenoma  . History of malignant melanoma    s/p  excision left forearm and left axill lymph node bx 09-10-2004  . Hyperlipidemia   . Hypertension   . Peripheral vascular disease ([place])   . Urinary retention     Past Surgical History:  Procedure Laterality Date  . ANAL FISSURE REPAIR  1984  . COLONOSCOPY N/A 05/02/2013   Procedure: COLONOSCOPY;  Surgeon: Garlan Fair, MD;  Location: [facility];  Service: Endoscopy;  Laterality: N/A;  . INSERTION OF SUPRAPUBIC CATHETER N/A 09/30/2015   Procedure: INSERTION OF SUPRAPUBIC CATHETER;  Surgeon: Kathie Rhodes, MD;  Location: [facility];  Service: Urology;  Laterality: N/A;  . SHOULDER ARTHROSCOPY W/ SUBACROMIAL DECOMPRESSION AND DISTAL CLAVICLE EXCISION Left 05-20-2010   w/  Debridement , Bursectomy, Acromioplasty,  Capsulectomy  . TRANSURETHRAL RESECTION OF PROSTATE N/A 09/30/2015   Procedure: TURP (TRANSURETHRAL RESECTION OF PROSTATE WITH GYRUS;  Surgeon: Kathie Rhodes, MD;  [facility];  Service: Urology;  Laterality: N/A;  . WIDE EXCISION MALIGNANT MELANOMA LEFT FOREARM/  LEFT AXILLA LYMPH NODE BX  09-10-2004    There were no vitals filed for this visit.  Subjective Assessment - 11/28/19 0900    Currently in Pain?  Yes    Pain Score  5     Pain Location  Back    Pain Orientation  Lower;Medial    Pain Descriptors / Indicators  Aching    Pain Type  Chronic pain    Pain Onset  More than a month ago    Pain Frequency  Intermittent    Aggravating Factors   movement, stretching    Pain Relieving Factors  rest         SLP Evaluation [facility] - 11/28/19 0900      SLP Visit Information   SLP Received On  11/28/19    Referring Provider (SLP)  Alonza Bogus, DO    Onset Date  August 2018    Medical Diagnosis  Parkinson's Dissease      Subjective   Subjective  Took PD med at ~7AM today.    Patient/Family Stated Goal  Obtain and maintain WNL speech loudness      General Information   HPI  Pt had PT for 8 weeks, then OT was recommended and pt had 8 weeks of that.  Pt had ST screen in a multi-d clinic in February and ST was recommended.       Balance Screen   Has the patient fallen in the past 6 months  No      Prior Functional Status   Cognitive/Linguistic Baseline  Within functional limits    Vocation  Other (Comment)   Commercial real estate      Cognition   Overall Cognitive Status  Within Functional Limits for tasks assessed      Auditory Comprehension   Overall Auditory Comprehension  Appears within functional limits for tasks assessed      Verbal Expression   Overall Verbal Expression  Appears within functional limits for tasks assessed      Oral Motor/Sensory Function   Overall Oral Motor/Sensory Function  Impaired    Labial ROM  Reduced right;Reduced left    Labial Symmetry  Within Functional Limits    Labial Strength  Reduced    Labial Sensation  Reduced    Lingual ROM  Reduced right;Reduced left    Lingual Symmetry  Within Functional  Limits    Lingual Strength  Reduced    Lingual Coordination  Reduced      Motor Speech   Overall Motor Speech  Impaired    Respiration  Impaired    Level of Impairment  --   chest breather   Phonation  Low vocal intensity   mid 60s dB-15 minute conversation; quick fade rarely seen from WNL volume to mid 60s dB after 2-3 seconds   Resonance  Within functional limits    Intelligibility  Intelligible    Effective Techniques  Increased vocal intensity    Phonation  --   loud /a/ mid 80s dB with some usul roughness so loud "hey" was used for loud production with WNL voice quality;  pt read passage low 70s dB 45 seconds when cued to "use more effort like you did with the loud "hey" productions"                     SLP Education - 11/28/19 1248    Education Details  eval results and possible goals, loud /a/ and "hey"    Person(s) Educated  Patient    Methods  Explanation;Demonstration;Verbal cues    Comprehension  Verbalized understanding;Returned demonstration;Verbal cues required;Need further instruction       SLP Short Term Goals - 11/28/19 1256      SLP SHORT TERM GOAL #1   Title  pt will produce loud /a/ or "hey!" with at least low 90s dB average over three sessions    Time  4    Period  Weeks    Status  New      SLP SHORT TERM GOAL #2   Title  pt will generate abdominal breathing 80% of the time when answering questions from SLP over 2 sessions    Time  4    Period  Weeks    Status  New      SLP SHORT TERM GOAL #3   Title  pt will generate abdominal breathing 75% of the time in 5 minutes simple conversation over 2 sessions    Time  4    Period  Weeks    Status  New      SLP SHORT TERM GOAL #4   Title  pt will produce WNL speech volume in 5 minutes simple conversation over 2 sessions    Time  4    Period  Weeks    Status  New       SLP Long Term Goals - 11/28/19 1258      SLP LONG TERM GOAL #1   Title  pt will generate abdominal breathing 75% of  the time in 10 minutes simple-mod complex conversation over 3 sessions    Time  7    Period  Weeks   or 15 total visits, for all LTGs   Status  New      SLP LONG TERM GOAL #2   Title  pt will produce WNL volume in 10 minutes simple to mod complex conversation over 3 visits    Time  7    Period  Weeks    Status  New       Plan - 11/28/19 1250    Clinical Impression Statement  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 Pt reports WNL eating/drinking, denying overt s/s aspiration during meals, however pt may require objective swallow eval during this therapy course (MBSS, FEES). Pt talked at suboptimal dB (mid 60s dB average) for 15 minutes. SLP demonstrated loud /a/ and loud "hey" however pt using intermittent harsh voice so SLP told pt to read aloud with louder speech for 3 minutes, twice a day. He did so for approx 45 seconds during eval with WNL volume and indicted he felt like he was shouting, so SLP used a digital recording for audio feedback for pt. Upon hearing this he raised his eyebrows and admitted he was not shouting. SLP believes pt will benefit from skilled ST targeting incr'd volume and greater usage of abdomoinal breathing in order to improve communicative effectiveness.    Speech Therapy Frequency  2x / week    Duration  --   7 weeks or 15 sessions total   Treatment/Interventions  Functional tasks;SLP instruction and feedback;Compensatory strategies;Patient/family education;Internal/external aids;Cueing hierarchy    Potential to Achieve Goals  Good       Patient will benefit from skilled therapeutic intervention in order to improve the following deficits and impairments:   Dysarthria and anarthria    Problem List Patient Active Problem List   Diagnosis Date Noted  . Parkinson's disease ([place]) 04/13/2017  . BPH (benign prostatic  hypertrophy) with urinary retention 09/30/2015    [facility] ,MS, CCC-SLP  11/28/2019, 1:00 PM  [place] [address] [place], [place], [address] Phone: [phone]   Fax:  [phone]  Name: Zachary Wang. MRN: [id number] Date of Birth: December 15, 1940

## 2019-11-28 NOTE — Patient Instructions (Signed)
  twice a day Read out loud in a LOUDER voice for 30 seconds, 6 times -

## 2019-12-12 ENCOUNTER — Ambulatory Visit: Payer: No Typology Code available for payment source

## 2019-12-12 ENCOUNTER — Other Ambulatory Visit: Payer: Self-pay

## 2019-12-12 DIAGNOSIS — R471 Dysarthria and anarthria: Secondary | ICD-10-CM

## 2019-12-12 NOTE — Patient Instructions (Signed)
  WRite out 10 sentences you say everyday  Do 10 loud "hey!", say your 10 sentences twice each day. Also, read out loud in 30-second intervals x6 each day.

## 2019-12-12 NOTE — Therapy (Signed)
[facility] [address] [place], [place], [address] Phone: [phone]   Fax:  [phone]  Speech Language Pathology Treatment  Patient Details  Name: Zachary Wang. MRN: [id number] Date of Birth: 1941/03/18 Referring Provider (SLP): Alonza Bogus, DO   Encounter Date: 12/12/2019  End of Session - 12/12/19 1134    Visit Number  2    Number of Visits  16    Date for SLP Re-Evaluation  02/16/20    Authorization Type  [facility]    Authorization - Visit Number  1    Authorization - Number of Visits  30    SLP Start Time  0805    SLP Stop Time   0845    SLP Time Calculation (min)  40 min    Activity Tolerance  Patient tolerated treatment well       Past Medical History:  Diagnosis Date  . Arthritis    hands  . BPH (benign prostatic hyperplasia)   . Elevated PSA   . Foley catheter in place   . History of adenomatous polyp of colon    2014  tubular adenoma  . History of malignant melanoma    s/p  excision left forearm and left axill lymph node bx 09-10-2004  . Hyperlipidemia   . Hypertension   . Peripheral vascular disease ([place])   . Urinary retention     Past Surgical History:  Procedure Laterality Date  . ANAL FISSURE REPAIR  1984  . COLONOSCOPY N/A 05/02/2013   Procedure: COLONOSCOPY;  Surgeon: Garlan Fair, MD;  Location: [facility];  Service: Endoscopy;  Laterality: N/A;  . INSERTION OF SUPRAPUBIC CATHETER N/A 09/30/2015   Procedure: INSERTION OF SUPRAPUBIC CATHETER;  Surgeon: Kathie Rhodes, MD;  Location: [facility];  Service: Urology;  Laterality: N/A;  . SHOULDER ARTHROSCOPY W/ SUBACROMIAL DECOMPRESSION AND DISTAL CLAVICLE EXCISION Left 05-20-2010   w/  Debridement , Bursectomy, Acromioplasty,  Capsulectomy  . TRANSURETHRAL RESECTION OF PROSTATE N/A 09/30/2015   Procedure: TURP (TRANSURETHRAL RESECTION OF PROSTATE WITH GYRUS;  Surgeon: Kathie Rhodes, MD;  Location: [facility];  Service: Urology;  Laterality: N/A;  . WIDE EXCISION MALIGNANT MELANOMA LEFT FOREARM/  LEFT AXILLA LYMPH NODE BX  09-10-2004    There were no vitals filed for this visit.  Subjective Assessment - 12/12/19 0809    Subjective  Pt had been reading out loud - wasn't sure he was aware of abdominal musculature recruitment.    Currently in Pain?  No/denies            ADULT SLP TREATMENT - 12/12/19 0817      General Information   Behavior/Cognition  Alert;Cooperative;Pleasant mood      Treatment Provided   Treatment provided  Cognitive-Linquistic      Cognitive-Linquistic Treatment   Treatment focused on  Dysarthria    Skilled Treatment  Loud "hey" used to facilitate louder speech volume in conversation - average low to mid 80s dB. Pt read common sentences with average low to mid 80s dB; SLP questioning pt if he felt abdominal push - SLP instructed pt to use tactile cues with hand on abdomen - pt req'd a good 5 -7 minutes with this technique before he began to feel and notice abdominal musculature working with loud "hey". In one sentence answers to SLP questions pt maintained WNL volume 80% of the time. No carryover to conversation outside of speech tasks.       Assessment /  Recommendations / Plan   Plan  Continue with current plan of care      Progression Toward Goals   Progression toward goals  Progressing toward goals       SLP Education - 12/12/19 1133    Education Details  vocal strain vs. abdominal strength/push with "hey", abdominal muscle recruitment necessary for WNL volume speech    Person(s) Educated  Patient    Methods  Explanation;Demonstration;Verbal cues    Comprehension  Verbalized understanding;Returned demonstration;Verbal cues required;Need further instruction       SLP Short Term Goals - 12/12/19 1135      SLP SHORT TERM GOAL #1   Title  pt will produce loud /a/ or "hey!" with at least low 90s dB average over three sessions    Time  4    Period   Weeks    Status  On-going      SLP SHORT TERM GOAL #2   Title  pt will generate abdominal breathing 80% of the time when answering questions from SLP over 2 sessions    Time  4    Period  Weeks    Status  On-going      SLP SHORT TERM GOAL #3   Title  pt will generate abdominal breathing 75% of the time in 5 minutes simple conversation over 2 sessions    Time  4    Period  Weeks    Status  On-going      SLP SHORT TERM GOAL #4   Title  pt will produce WNL speech volume in 5 minutes simple conversation over 2 sessions    Time  4    Period  Weeks    Status  On-going       SLP Long Term Goals - 12/12/19 1135      SLP LONG TERM GOAL #1   Title  pt will generate abdominal breathing 75% of the time in 10 minutes simple-mod complex conversation over 3 sessions    Time  7    Period  Weeks   or 15 total visits, for all LTGs   Status  On-going      SLP LONG TERM GOAL #2   Title  pt will produce WNL volume in 10 minutes simple to mod complex conversation over 3 visits    Time  7    Period  Weeks    Status  On-going       Plan - 12/12/19 1134    Clinical Impression Statement  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 Pt is moving towards meeting STGs. SLP believes pt will cont to benefit from skilled ST targeting incr'd volume and greater usage of abdomoinal breathing in order to improve communicative effectiveness.    Speech Therapy Frequency  2x / week    Duration  --   7 weeks or 15 sessions total   Treatment/Interventions  Functional tasks;SLP instruction and feedback;Compensatory strategies;Patient/family education;Internal/external aids;Cueing hierarchy    Potential to Achieve Goals  Good       Patient will benefit from skilled therapeutic intervention in order to improve the following deficits and impairments:   Dysarthria and  anarthria    Problem List Patient Active Problem List   Diagnosis Date Noted  . Parkinson's disease ([place]) 04/13/2017  . BPH (benign prostatic hypertrophy) with urinary retention 09/30/2015    [facility] ,MS, CCC-SLP  12/12/2019, 11:36 AM  [facility] [address] [place], [place], [address] Phone: [phone]   Fax:  [phone]   Name: Ayotunde Kasparek. MRN: [id number] Date of Birth: Jan 13, 1941

## 2019-12-14 ENCOUNTER — Ambulatory Visit: Payer: No Typology Code available for payment source

## 2019-12-14 ENCOUNTER — Other Ambulatory Visit: Payer: Self-pay

## 2019-12-14 DIAGNOSIS — R471 Dysarthria and anarthria: Secondary | ICD-10-CM

## 2019-12-14 NOTE — Therapy (Signed)
[facility] [address] [place], [place], [address] Phone: [phone]   Fax:  [phone]  Speech Language Pathology Treatment  Patient Details  Name: Zachary Wang. MRN: [id number] Date of Birth: 07-Nov-1940 Referring Provider (SLP): Alonza Bogus, DO   Encounter Date: 12/14/2019  End of Session - 12/14/19 1058    Visit Number  3    Number of Visits  16    Date for SLP Re-Evaluation  02/16/20    Authorization - Visit Number  2    Authorization - Number of Visits  23    SLP Start Time  0805    SLP Stop Time   0845    SLP Time Calculation (min)  40 min    Activity Tolerance  Patient tolerated treatment well       Past Medical History:  Diagnosis Date  . Arthritis    hands  . BPH (benign prostatic hyperplasia)   . Elevated PSA   . Foley catheter in place   . History of adenomatous polyp of colon    2014  tubular adenoma  . History of malignant melanoma    s/p  excision left forearm and left axill lymph node bx 09-10-2004  . Hyperlipidemia   . Hypertension   . Peripheral vascular disease ([place])   . Urinary retention     Past Surgical History:  Procedure Laterality Date  . ANAL FISSURE REPAIR  1984  . COLONOSCOPY N/A 05/02/2013   Procedure: COLONOSCOPY;  Surgeon: Garlan Fair, MD;  Location: [facility];  Service: Endoscopy;  Laterality: N/A;  . INSERTION OF SUPRAPUBIC CATHETER N/A 09/30/2015   Procedure: INSERTION OF SUPRAPUBIC CATHETER;  Surgeon: Kathie Rhodes, MD;  Location: [facility];  Service: Urology;  Laterality: N/A;  . SHOULDER ARTHROSCOPY W/ SUBACROMIAL DECOMPRESSION AND DISTAL CLAVICLE EXCISION Left 05-20-2010   w/  Debridement , Bursectomy, Acromioplasty,  Capsulectomy  . TRANSURETHRAL RESECTION OF PROSTATE N/A 09/30/2015   Procedure: TURP (TRANSURETHRAL RESECTION OF PROSTATE WITH GYRUS;  Surgeon: Kathie Rhodes, MD;  Location[facility];  Service: Urology;   Laterality: N/A;  . WIDE EXCISION MALIGNANT MELANOMA LEFT FOREARM/  LEFT AXILLA LYMPH NODE BX  09-10-2004    There were no vitals filed for this visit.         ADULT SLP TREATMENT - 12/14/19 0812      General Information   Behavior/Cognition  Alert;Cooperative;Pleasant mood      Treatment Provided   Treatment provided  Cognitive-Linquistic      Cognitive-Linquistic Treatment   Treatment focused on  Dysarthria    Skilled Treatment  Loud "hey" used to facilitate louder speech volume in conversation - average mid 80s dB. Pt read everyday sentences with average low to mid 80s dB with usual SLP cues for loudness. SLP introduced abdominl breathing (AB) with pt today. Pt in seated req'd mod A occasionlaly so SLP moved to supine - pt with much better success - 90% overall.  When SLP instructed pt to sit again, after 7 minutes AB in supine pt stated, "It feels strange." SLP told pt to practice AB in supine for 10 of the 15 minutes, then sit up for the last five minutes. Pt to practice AB 15 minutes BID.       Assessment / Recommendations / Plan   Plan  Continue with current plan of care      Progression Toward Goals   Progression toward goals  Progressing toward goals  SLP Education - 12/14/19 1058    Education Details  abdominal breathing    Person(s) Educated  Patient    Methods  Demonstration;Explanation;Verbal cues    Comprehension  Verbalized understanding;Returned demonstration;Verbal cues required;Need further instruction       SLP Short Term Goals - 12/14/19 1059      SLP SHORT TERM GOAL #1   Title  pt will produce loud /a/ or "hey!" with at least low 90s dB average over three sessions    Time  4    Period  Weeks    Status  On-going      SLP SHORT TERM GOAL #2   Title  pt will generate abdominal breathing 80% of the time when answering questions from SLP over 2 sessions    Time  4    Period  Weeks    Status  On-going      SLP SHORT TERM GOAL #3   Title  pt  will generate abdominal breathing 75% of the time in 5 minutes simple conversation over 2 sessions    Time  4    Period  Weeks    Status  On-going      SLP SHORT TERM GOAL #4   Title  pt will produce WNL speech volume in 5 minutes simple conversation over 2 sessions    Time  4    Period  Weeks    Status  On-going       SLP Long Term Goals - 12/12/19 1135      SLP LONG TERM GOAL #1   Title  pt will generate abdominal breathing 75% of the time in 10 minutes simple-mod complex conversation over 3 sessions    Time  7    Period  Weeks   or 15 total visits, for all LTGs   Status  On-going      SLP LONG TERM GOAL #2   Title  pt will produce WNL volume in 10 minutes simple to mod complex conversation over 3 visits    Time  7    Period  Weeks    Status  On-going       Plan - 12/14/19 1059    Clinical Impression Statement  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 Abdominal breathing (AB) was presented to pt today. Pt is moving towards meeting STGs. SLP believes pt will cont to benefit from skilled ST targeting incr'd volume and greater usage of abdomoinal breathing in order to improve communicative effectiveness.    Speech Therapy Frequency  2x / week    Duration  --   7 weeks or 15 sessions total   Treatment/Interventions  Functional tasks;SLP instruction and feedback;Compensatory strategies;Patient/family education;Internal/external aids;Cueing hierarchy    Potential to Achieve Goals  Good       Patient will benefit from skilled therapeutic intervention in order to improve the following deficits and impairments:   Dysarthria and anarthria    Problem List Patient Active Problem List   Diagnosis Date Noted  . Parkinson's disease ([place]) 04/13/2017  . BPH (benign prostatic hypertrophy) with urinary retention 09/30/2015    [facility]  ,[place], CCC-SLP  12/14/2019, 11:00 AM  [facility] [address] [place], [place], [address] Phone: [phone]   Fax:  [phone]   Name: Zachary Wang. MRN: [id number] Date of Birth: [birth year]

## 2019-12-14 NOTE — Patient Instructions (Signed)
Practice abdominal breathing 15 minutes, twice each day.  Lay down for 10 minutes, then sit up for the last 5 minutes.

## 2019-12-19 ENCOUNTER — Other Ambulatory Visit: Payer: Self-pay

## 2019-12-19 ENCOUNTER — Ambulatory Visit: Payer: No Typology Code available for payment source | Attending: Internal Medicine

## 2019-12-19 DIAGNOSIS — R471 Dysarthria and anarthria: Secondary | ICD-10-CM | POA: Diagnosis not present

## 2019-12-19 DIAGNOSIS — R490 Dysphonia: Secondary | ICD-10-CM | POA: Insufficient documentation

## 2019-12-19 DIAGNOSIS — G20 Parkinson's disease: Secondary | ICD-10-CM | POA: Insufficient documentation

## 2019-12-19 NOTE — Therapy (Signed)
[facility] [address] [place], [place], [address] Phone: [phone]   Fax:  [phone]  Speech Language Pathology Treatment  Patient Details  Name: Zachary Wang. MRN: [id number] Date of Birth: 06-Aug-1941 Referring Provider (SLP): Alonza Bogus, DO   Encounter Date: 12/19/2019  End of Session - 12/19/19 1712    Visit Number  4    Number of Visits  16    Date for SLP Re-Evaluation  02/16/20    Authorization Type  [facility]    Authorization - Visit Number  3    Authorization - Number of Visits  71    SLP Start Time  0805    SLP Stop Time   0845    SLP Time Calculation (min)  40 min    Activity Tolerance  Patient tolerated treatment well       Past Medical History:  Diagnosis Date  . Arthritis    hands  . BPH (benign prostatic hyperplasia)   . Elevated PSA   . Foley catheter in place   . History of adenomatous polyp of colon    2014  tubular adenoma  . History of malignant melanoma    s/p  excision left forearm and left axill lymph node bx 09-10-2004  . Hyperlipidemia   . Hypertension   . Peripheral vascular disease ([place])   . Urinary retention     Past Surgical History:  Procedure Laterality Date  . ANAL FISSURE REPAIR  1984  . COLONOSCOPY N/A 05/02/2013   Procedure: COLONOSCOPY;  Surgeon: Garlan Fair, MD;  Location: [facility];  Service: Endoscopy;  Laterality: N/A;  . INSERTION OF SUPRAPUBIC CATHETER N/A 09/30/2015   Procedure: INSERTION OF SUPRAPUBIC CATHETER;  Surgeon: Kathie Rhodes, MD;  Location: [facility];  Service: Urology;  Laterality: N/A;  . SHOULDER ARTHROSCOPY W/ SUBACROMIAL DECOMPRESSION AND DISTAL CLAVICLE EXCISION Left 05-20-2010   w/  Debridement , Bursectomy, Acromioplasty,  Capsulectomy  . TRANSURETHRAL RESECTION OF PROSTATE N/A 09/30/2015   Procedure: TURP (TRANSURETHRAL RESECTION OF PROSTATE WITH GYRUS;  Surgeon: Kathie Rhodes, MD;  Location: [facility];  Service: Urology;  Laterality: N/A;  . WIDE EXCISION MALIGNANT MELANOMA LEFT FOREARM/  LEFT AXILLA LYMPH NODE BX  09-10-2004    There were no vitals filed for this visit.  Subjective Assessment - 12/19/19 0818    Subjective  Pt stayed consistent with homework over the weekend.    Currently in Pain?  Yes    Pain Score  6     Pain Location  Back    Pain Orientation  Lower;Medial    Pain Descriptors / Indicators  Aching;Sore    Pain Type  Chronic pain    Pain Onset  More than a month ago    Pain Frequency  Intermittent    Aggravating Factors   movement            ADULT SLP TREATMENT - 12/19/19 0820      General Information   Behavior/Cognition  Pleasant mood;Cooperative;Alert      Treatment Provided   Treatment provided  Cognitive-Linquistic      Cognitive-Linquistic Treatment   Treatment focused on  Dysarthria    Skilled Treatment  Loud "hey" used to facilitate louder speech volume in conversation - average mid-upper 80s dB. "Drawn out "hey" used to prep for "ah" however pt with vocal strain. Pt read everyday sentences with average mid 80s dB with rare min SLP cues for loudness. SLP  told pt to practice AB in supine for 15 minutes, then sit up for the last 2-3 minutes. Pt read scrambled sentences with mid 70s dB average. In sentence responses (simple) pt maintained WNL volume with rare min A.      Assessment / Recommendations / Plan   Plan  Continue with current plan of care      Progression Toward Goals   Progression toward goals  Progressing toward goals         SLP Short Term Goals - 12/19/19 1715      SLP SHORT TERM GOAL #1   Title  pt will produce loud /a/ or "hey!" with at least low 90s dB average over three sessions    Baseline  12-19-19    Time  3    Period  Weeks    Status  On-going      SLP SHORT TERM GOAL #2   Title  pt will generate abdominal breathing 80% of the time when answering questions from SLP over 2 sessions    Time  3    Period   Weeks    Status  On-going      SLP SHORT TERM GOAL #3   Title  pt will generate abdominal breathing 75% of the time in 5 minutes simple conversation over 2 sessions    Time  3    Period  Weeks    Status  On-going      SLP SHORT TERM GOAL #4   Title  pt will produce WNL speech volume in 5 minutes simple conversation over 2 sessions    Time  4    Period  Weeks    Status  On-going       SLP Long Term Goals - 12/19/19 1716      SLP LONG TERM GOAL #1   Title  pt will generate abdominal breathing 75% of the time in 10 minutes simple-mod complex conversation over 3 sessions    Time  6    Period  Weeks   or 15 total visits, for all LTGs   Status  On-going      SLP LONG TERM GOAL #2   Title  pt will produce WNL volume in 10 minutes simple to mod complex conversation over 3 visits    Time  6    Period  Weeks    Status  On-going       Plan - 12/19/19 1713    Clinical Impression Statement  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 Abdominal breathing (AB) was practiced today however pt cont to have difficulty with AB in seated position. See "skilled intervention" for more details. Pt is moving towards meeting STGs. SLP believes pt will cont to benefit from skilled ST targeting incr'd volume and greater usage of abdomoinal breathing in order to improve communicative effectiveness.    Speech Therapy Frequency  2x / week    Duration  --   7 weeks or 15 sessions total   Treatment/Interventions  Functional tasks;SLP instruction and feedback;Compensatory strategies;Patient/family education;Internal/external aids;Cueing hierarchy    Potential to Achieve Goals  Good       Patient will benefit from skilled therapeutic intervention in order to improve the following deficits and impairments:   Dysarthria and anarthria    Problem List Patient Active  Problem List   Diagnosis Date Noted  . Parkinson's disease ([place]) 04/13/2017  .  BPH (benign prostatic hypertrophy) with urinary retention 09/30/2015    [facility] ,[place], CCC-SLP  12/19/2019, 5:16 PM  [facility] [address] [place], [place], [address] Phone: [phone]   Fax:  [phone]   Name: Franc Levasseur. MRN: [id number] Date of Birth: 07-Aug-1941

## 2019-12-19 NOTE — Patient Instructions (Signed)
  TWICE A DAY: 1) 10 loud "hey!", reading out loud 1 minute (30 second intervals),  Everyday   sentences, 5 minutes on speech tasks (handouts) 2) Breathe abdominally 17 minutes - 15 minutes laying down and 2 minutes sitting up

## 2019-12-22 ENCOUNTER — Other Ambulatory Visit: Payer: Self-pay

## 2019-12-22 ENCOUNTER — Ambulatory Visit: Payer: No Typology Code available for payment source

## 2019-12-22 DIAGNOSIS — R471 Dysarthria and anarthria: Secondary | ICD-10-CM

## 2019-12-22 NOTE — Therapy (Signed)
[facility] [address] [place], [place], [address] Phone: [phone]   Fax:  [phone]  Speech Language Pathology Treatment  Patient Details  Name: Zachary Wang. MRN: [id number] Date of Birth: 1941/05/07 Referring Provider (SLP): Alonza Bogus, DO   Encounter Date: 12/22/2019  End of Session - 12/22/19 1418    Visit Number  5    Number of Visits  16    Date for SLP Re-Evaluation  02/16/20    Authorization Type  [facility]    Authorization - Visit Number  4    Authorization - Number of Visits  15    SLP Start Time  1320    SLP Stop Time   1400    SLP Time Calculation (min)  40 min    Activity Tolerance  Patient tolerated treatment well       Past Medical History:  Diagnosis Date  . Arthritis    hands  . BPH (benign prostatic hyperplasia)   . Elevated PSA   . Foley catheter in place   . History of adenomatous polyp of colon    2014  tubular adenoma  . History of malignant melanoma    s/p  excision left forearm and left axill lymph node bx 09-10-2004  . Hyperlipidemia   . Hypertension   . Peripheral vascular disease ([place])   . Urinary retention     Past Surgical History:  Procedure Laterality Date  . ANAL FISSURE REPAIR  1984  . COLONOSCOPY N/A 05/02/2013   Procedure: COLONOSCOPY;  Surgeon: Garlan Fair, MD;  Location: [facility];  Service: Endoscopy;  Laterality: N/A;  . INSERTION OF SUPRAPUBIC CATHETER N/A 09/30/2015   Procedure: INSERTION OF SUPRAPUBIC CATHETER;  Surgeon: Kathie Rhodes, MD;  Location[facility];  Service: Urology;  Laterality: N/A;  . SHOULDER ARTHROSCOPY W/ SUBACROMIAL DECOMPRESSION AND DISTAL CLAVICLE EXCISION Left 05-20-2010   w/  Debridement , Bursectomy, Acromioplasty,  Capsulectomy  . TRANSURETHRAL RESECTION OF PROSTATE N/A 09/30/2015   Procedure: TURP (TRANSURETHRAL RESECTION OF PROSTATE WITH GYRUS;  Surgeon: Kathie Rhodes, MD;  Location: [facility];  Service: Urology;  Laterality: N/A;  . WIDE EXCISION MALIGNANT MELANOMA LEFT FOREARM/  LEFT AXILLA LYMPH NODE BX  09-10-2004    There were no vitals filed for this visit.  Subjective Assessment - 12/22/19 1312    Subjective  Pt reports cont'd consistency with homework for loudness.    Currently in Pain?  Yes    Pain Score  3     Pain Location  Back    Pain Orientation  Lower;Mid    Pain Descriptors / Indicators  Sore    Pain Type  Chronic pain            ADULT SLP TREATMENT - 12/22/19 1327      General Information   Behavior/Cognition  Pleasant mood;Cooperative;Alert      Treatment Provided   Treatment provided  Cognitive-Linquistic      Cognitive-Linquistic Treatment   Treatment focused on  Dysarthria    Skilled Treatment  Pt read some of his responses on homework with upper 60s lower 70s dB. SLP told pt just outsde of wNL and pt red 10 more at [facility] with min cue once for loudness. SLP reitrted to pt that practice was important to changing softer speech to WNL. Pt expressed concern at being too loud- SLP again told pt that the aim is not to have him be "too loud" but  to INCREASE his volume to WNL. Pt's "hey" productions were in the lower 90s dB today. He read for 1 minute with suboptimal loudness, when asked to "click up 2 clicks on the effort dial. Pttold SLP he could feel difference in the two productions. However pt alternated between WNL and wuboptimal volume so pt placed reminder marks in the paragraph to remain loud - this assisted pt. Pt described pictures for SLP with occasional min A for loudness. On the way out pt stated, "I'll be the loudest one in the room." and SLP assured pt that our aim is to improve his volume to WNL level (low 70s dB).       Assessment / Recommendations / Plan   Plan  Continue with current plan of care      Progression Toward Goals   Progression toward goals  Progressing toward goals       SLP Education - 12/22/19 1418    Education  Details  goal is not to make pt abnormally loud but improve his volume to WNL    Person(s) Educated  Patient    Methods  Explanation    Comprehension  Verbalized understanding;Need further instruction       SLP Short Term Goals - 12/22/19 1420      SLP SHORT TERM GOAL #1   Title  pt will produce loud /a/ or "hey!" with at least low 90s dB average over three sessions    Baseline  12-19-19, 12-22-19    Time  3    Period  Weeks    Status  On-going      SLP SHORT TERM GOAL #2   Title  pt will generate abdominal breathing 80% of the time in the seated position over 2 sessions    Time  3    Period  Weeks    Status  Revised      SLP SHORT TERM GOAL #3   Title  pt will generate abdominal breathing in 75% of sentence responses when answering simple questions over 2 sessions    Time  3    Period  Weeks    Status  Revised      SLP SHORT TERM GOAL #4   Title  pt will produce WNL speech volume in 3 minutes simple conversation over 2 sessions    Time  4    Period  Weeks    Status  Revised       SLP Long Term Goals - 12/22/19 1422      SLP LONG TERM GOAL #1   Title  pt will generate abdominal breathing 75% of the time in 10 minutes simple-mod complex conversation over 3 sessions    Time  6    Period  Weeks   or 15 total visits, for all LTGs   Status  On-going      SLP LONG TERM GOAL #2   Title  pt will produce WNL volume in 10 minutes simple to mod complex conversation over 3 visits    Time  6    Period  Weeks    Status  On-going       Plan - 12/22/19 1419    Clinical Impression Statement  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 Pt cont to have difficulty with AB at rest in seated position. Also, pt cont to think that he will be "too  loud" and  this hinders his use of appropriate effort level for WNL loudness in simple linguisitic tasks. See  "skilled intervention" for more details. Pt is moving towards meeting STGs. SLP believes pt will cont to benefit from skilled ST targeting incr'd volume and greater usage of abdomoinal breathing in order to improve communicative effectiveness.    Speech Therapy Frequency  2x / week    Duration  --   7 weeks or 15 sessions total   Treatment/Interventions  Functional tasks;SLP instruction and feedback;Compensatory strategies;Patient/family education;Internal/external aids;Cueing hierarchy    Potential to Achieve Goals  Good       Patient will benefit from skilled therapeutic intervention in order to improve the following deficits and impairments:   Dysarthria and anarthria    Problem List Patient Active Problem List   Diagnosis Date Noted  . Parkinson's disease ([place]) 04/13/2017  . BPH (benign prostatic hypertrophy) with urinary retention 09/30/2015    [facility] ,[place], [place]  12/22/2019, 2:22 PM  [facility] [address] [place], [place], [address] Phone: [phone]   Fax:  [phone]   Name: Zachary Wang. MRN: [id number] Date of Birth: 1940/11/10

## 2019-12-22 NOTE — Patient Instructions (Signed)
  Please complete the assigned speech therapy homework prior to your next session and return it to the speech therapist at your next visit.  

## 2019-12-26 ENCOUNTER — Encounter: Payer: Self-pay | Admitting: Speech Pathology

## 2019-12-26 ENCOUNTER — Ambulatory Visit: Payer: No Typology Code available for payment source | Admitting: Speech Pathology

## 2019-12-26 ENCOUNTER — Other Ambulatory Visit: Payer: Self-pay

## 2019-12-26 DIAGNOSIS — R471 Dysarthria and anarthria: Secondary | ICD-10-CM

## 2019-12-26 NOTE — Therapy (Signed)
Zachary Wang [address] [place], [place], [address] Phone: [phone]   Fax:  [phone]  Speech Language Pathology Treatment  Patient Details  Name: Zachary Wang. MRN: [id number] Date of Birth: 16-Sep-1940 Referring Provider (SLP): Zachary Bogus, DO   Encounter Date: 12/26/2019  End of Session - 12/26/19 1132    Visit Number  6    Number of Visits  16    Date for SLP Re-Evaluation  02/16/20    Authorization Type  [facility]    Authorization - Visit Number  5    Authorization - Number of Visits  15    SLP Start Time  0932    SLP Stop Time   [id number]    SLP Time Calculation (min)  42 min    Activity Tolerance  Patient tolerated treatment well       Past Medical History:  Diagnosis Date  . Arthritis    hands  . BPH (benign prostatic hyperplasia)   . Elevated PSA   . Foley catheter in place   . History of adenomatous polyp of colon    2014  tubular adenoma  . History of malignant melanoma    s/p  excision left forearm and left axill lymph node bx 09-10-2004  . Hyperlipidemia   . Hypertension   . Peripheral vascular disease ([place])   . Urinary retention     Past Surgical History:  Procedure Laterality Date  . ANAL FISSURE REPAIR  1984  . COLONOSCOPY N/A 05/02/2013   Procedure: COLONOSCOPY;  Surgeon: Garlan Fair, MD;  Location: [facility];  Service: Endoscopy;  Laterality: N/A;  . INSERTION OF SUPRAPUBIC CATHETER N/A 09/30/2015   Procedure: INSERTION OF SUPRAPUBIC CATHETER;  Surgeon: Kathie Rhodes, MD;  Location[facility];  Service: Urology;  Laterality: N/A;  . SHOULDER ARTHROSCOPY W/ SUBACROMIAL DECOMPRESSION AND DISTAL CLAVICLE EXCISION Left 05-20-2010   w/  Debridement , Bursectomy, Acromioplasty,  Capsulectomy  . TRANSURETHRAL RESECTION OF PROSTATE N/A 09/30/2015   Procedure: TURP (TRANSURETHRAL RESECTION OF PROSTATE WITH GYRUS;  Surgeon: Kathie Rhodes, MD;  Location: [facility];  Service: Urology;  Laterality: N/A;  . WIDE EXCISION MALIGNANT MELANOMA LEFT FOREARM/  LEFT AXILLA LYMPH NODE BX  09-10-2004    There were no vitals filed for this visit.  Subjective Assessment - 12/26/19 0942    Subjective  She doesn't ask me to repeat myself    Currently in Pain?  No/denies            ADULT SLP TREATMENT - 12/26/19 0944      General Information   Behavior/Cognition  Pleasant mood;Cooperative;Alert      Treatment Provided   Treatment provided  Cognitive-Linquistic      Cognitive-Linquistic Treatment   Treatment focused on  Dysarthria;Patient/family/caregiver education    Skilled Treatment  Loud "Hey!"  (90dB) and loud oral reading (76dB average) to recalibrate low volume. Continue to affirm to pt that it is normal to feel too loud when you are used to talking to low. Structured speech tasks at sentence level averaged 70dB with minimal cognitive load. As cognitive load increased, pt reqired frequent verbal and visual cues to increase volume to 70dB. In conversation, averaged 67-68 dB with visual cues for volume. At the end I asked him if he forgets what he is saying and looses train of thought. He states this has happened to him at social gatherings and is bothered by it.  Assessment / Recommendations / Plan   Plan  Continue with current plan of care         SLP Short Term Goals - 12/26/19 1131      SLP SHORT TERM GOAL #1   Title  pt will produce loud /a/ or "hey!" with at least low 90s dB average over three sessions    Baseline  12-19-19, 12-22-19, 12-26-19    Time  2    Period  Weeks    Status  Achieved      SLP SHORT TERM GOAL #2   Title  pt will generate abdominal breathing 80% of the time in the seated position over 2 sessions    Time  2    Period  Weeks    Status  Revised      SLP SHORT TERM GOAL #3   Title  pt will generate abdominal breathing in 75% of sentence responses when answering simple questions over 2 sessions    Time  2     Period  Weeks    Status  Revised      SLP SHORT TERM GOAL #4   Title  pt will produce WNL speech volume in 3 minutes simple conversation over 2 sessions    Time  3    Period  Weeks    Status  Revised       SLP Long Term Goals - 12/26/19 1131      SLP LONG TERM GOAL #1   Title  pt will generate abdominal breathing 75% of the time in 10 minutes simple-mod complex conversation over 3 sessions    Time  5    Period  Weeks   or 15 total visits, for all LTGs   Status  On-going      SLP LONG TERM GOAL #2   Title  pt will produce WNL volume in 10 minutes simple to mod complex conversation over 3 visits    Time  5    Period  Weeks    Status  On-going       Plan - 12/26/19 1130    Clinical Impression Statement  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 Pt cont to have difficulty with AB at rest in seated position. Also, pt cont to think that he will be "too  loud" and this hinders his use of appropriate effort level for WNL loudness in simple linguisitic tasks. See "skilled intervention" for more details. Pt is moving towards meeting STGs. SLP believes pt will cont to benefit from skilled ST targeting incr'd volume and greater usage of abdomoinal breathing in order to improve communicative effectiveness.    Speech Therapy Frequency  2x / week    Duration  --   7 weeks or 15 sessions   Treatment/Interventions  Functional tasks;SLP instruction and feedback;Compensatory strategies;Patient/family education;Internal/external aids;Cueing hierarchy    Potential to Achieve Goals  Good       Patient will benefit from skilled therapeutic intervention in order to improve the following deficits and impairments:   Dysarthria and anarthria    Problem List Patient Active Problem List   Diagnosis Date Noted  . Parkinson's disease ([place]) 04/13/2017  . BPH  (benign prostatic hypertrophy) with urinary retention 09/30/2015    [name], Annye Rusk MS, CCC-SLP 12/26/2019, 11:32 AM  [facility] [address] [place], [place], [address] Phone: [phone]  Fax:  [phone]   Name: Zachary Wang. MRN: [id number] Date of Birth: 02-07-1941

## 2019-12-28 ENCOUNTER — Other Ambulatory Visit: Payer: Self-pay

## 2019-12-28 ENCOUNTER — Ambulatory Visit: Payer: No Typology Code available for payment source | Admitting: Speech Pathology

## 2019-12-28 DIAGNOSIS — R471 Dysarthria and anarthria: Secondary | ICD-10-CM

## 2019-12-28 NOTE — Therapy (Signed)
[facility] [address] [place], [place], [address] Phone: [phone]   Fax:  [phone]  Speech Language Pathology Treatment  Patient Details  Name: Zachary Wang. MRN: [id number] Date of Birth: 01/06/1941 Referring Provider (SLP): Alonza Bogus, DO   Encounter Date: 12/28/2019  End of Session - 12/28/19 0844    Visit Number  7    Number of Visits  16    Date for SLP Re-Evaluation  02/16/20    Authorization Type  [facility]    Authorization - Visit Number  6    Authorization - Number of Visits  15    SLP Start Time  [id number]    SLP Stop Time   0845    SLP Time Calculation (min)  41 min    Activity Tolerance  Patient tolerated treatment well       Past Medical History:  Diagnosis Date  . Arthritis    hands  . BPH (benign prostatic hyperplasia)   . Elevated PSA   . Foley catheter in place   . History of adenomatous polyp of colon    2014  tubular adenoma  . History of malignant melanoma    s/p  excision left forearm and left axill lymph node bx 09-10-2004  . Hyperlipidemia   . Hypertension   . Peripheral vascular disease ([place])   . Urinary retention     Past Surgical History:  Procedure Laterality Date  . ANAL FISSURE REPAIR  1984  . COLONOSCOPY N/A 05/02/2013   Procedure: COLONOSCOPY;  Surgeon: Garlan Fair, MD;  Location: [facility];  Service: Endoscopy;  Laterality: N/A;  . INSERTION OF SUPRAPUBIC CATHETER N/A 09/30/2015   Procedure: INSERTION OF SUPRAPUBIC CATHETER;  Surgeon: Kathie Rhodes, MD;  Location: [facility];  Service: Urology;  Laterality: N/A;  . SHOULDER ARTHROSCOPY W/ SUBACROMIAL DECOMPRESSION AND DISTAL CLAVICLE EXCISION Left 05-20-2010   w/  Debridement , Bursectomy, Acromioplasty,  Capsulectomy  . TRANSURETHRAL RESECTION OF PROSTATE N/A 09/30/2015   Procedure: TURP (TRANSURETHRAL RESECTION OF PROSTATE WITH GYRUS;  Surgeon: Kathie Rhodes, MD;  Location: [facility];  Service: Urology;  Laterality: N/A;  . WIDE EXCISION MALIGNANT MELANOMA LEFT FOREARM/  LEFT AXILLA LYMPH NODE BX  09-10-2004    There were no vitals filed for this visit.  Subjective Assessment - 12/28/19 0807    Subjective  "I don't guess I realized I don't talk loud enough."    Currently in Pain?  No/denies            ADULT SLP TREATMENT - 12/28/19 0808      General Information   Behavior/Cognition  Pleasant mood;Cooperative;Alert      Treatment Provided   Treatment provided  Cognitive-Linquistic      Pain Assessment   Pain Assessment  No/denies pain      Cognitive-Linquistic Treatment   Treatment focused on  Dysarthria;Patient/family/caregiver education    Skilled Treatment  Attempted loud /a/ and shaping due to hyperfunction; ultimately used loud "Hey" 92 dB avg and loud oral reading of everday phrases/sentences to recalibrate low volume. Structured speech tasks at sentence level averaged low 70s with minimal cognitive load. Sentences 10+ words WNL vocal intensity/quality with rare min A. In conversation, pt averaged upper 60s dB with occasional min-mod A for loudness, to avoid throat clearing/breathe when vocal quality deteriorated.      Assessment / Recommendations / Plan   Plan  Continue with current plan of care  Progression Toward Goals   Progression toward goals  Progressing toward goals       SLP Education - 12/28/19 0844    Education Details  when voice sounds hoarse, need to get louder    Person(s) Educated  Patient    Methods  Explanation    Comprehension  Verbalized understanding       SLP Short Term Goals - 12/28/19 0845      SLP SHORT TERM GOAL #1   Title  pt will produce loud /a/ or "hey!" with at least low 90s dB average over three sessions    Baseline  12-19-19, 12-22-19, 12-26-19    Time  2    Period  Weeks    Status  Achieved      SLP SHORT TERM GOAL #2   Title  pt will generate abdominal breathing 80% of the time in the seated  position over 2 sessions    Time  2    Period  Weeks    Status  On-going      SLP SHORT TERM GOAL #3   Title  pt will generate abdominal breathing in 75% of sentence responses when answering simple questions over 2 sessions    Time  2    Period  Weeks    Status  On-going      SLP SHORT TERM GOAL #4   Title  pt will produce WNL speech volume in 3 minutes simple conversation over 2 sessions    Time  3    Period  Weeks    Status  On-going       SLP Long Term Goals - 12/28/19 0845      SLP LONG TERM GOAL #1   Title  pt will generate abdominal breathing 75% of the time in 10 minutes simple-mod complex conversation over 3 sessions    Time  5    Period  Weeks   or 15 total visits, for all LTGs   Status  On-going      SLP LONG TERM GOAL #2   Title  pt will produce WNL volume in 10 minutes simple to mod complex conversation over 3 visits    Time  5    Period  Weeks    Status  On-going       Plan - 12/28/19 0844    Clinical Impression Statement  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 Pt cont to have difficulty with AB at rest in seated position. Also, pt cont to think that he will be "too  loud" and this hinders his use of appropriate effort level for WNL loudness in simple linguisitic tasks. See "skilled intervention" for more details. Pt is moving towards meeting STGs. SLP believes pt will cont to benefit from skilled ST targeting incr'd volume and greater usage of abdomoinal breathing in order to improve communicative effectiveness.    Speech Therapy Frequency  2x / week    Duration  --   7 weeks or 15 sessions   Treatment/Interventions  Functional tasks;SLP instruction and feedback;Compensatory strategies;Patient/family education;Internal/external aids;Cueing hierarchy    Potential to Achieve Goals  Good       Patient will benefit from  skilled therapeutic intervention in order to improve the following deficits and impairments:   Dysarthria and anarthria    Problem List Patient Active Problem List   Diagnosis Date Noted  . Parkinson's disease ([place])  04/13/2017  . BPH (benign prostatic hypertrophy) with urinary retention 09/30/2015   Deneise Lever, [place], [place] 12/28/2019, 8:46 AM  [facility] [address] [place], [place], [address] Phone: [phone]   Fax:  [phone]   Name: Zachary Wang. MRN: [id number] Date of Birth: 1941-06-16

## 2020-01-02 ENCOUNTER — Other Ambulatory Visit: Payer: Self-pay

## 2020-01-02 ENCOUNTER — Ambulatory Visit: Payer: No Typology Code available for payment source | Admitting: Speech Pathology

## 2020-01-02 DIAGNOSIS — R471 Dysarthria and anarthria: Secondary | ICD-10-CM

## 2020-01-02 NOTE — Therapy (Signed)
[facility] [address] [place], [place], [address] Phone: [phone]   Fax:  [phone]  Speech Language Pathology Treatment  Patient Details  Name: Zachary Wang. MRN: [id number] Date of Birth: [birth year] Referring Provider (SLP): Alonza Bogus, DO   Encounter Date: 01/02/2020  End of Session - 01/02/20 0819    Visit Number  8    Number of Visits  16    Date for SLP Re-Evaluation  02/16/20    Authorization - Visit Number  7    Authorization - Number of Visits  54    SLP Start Time  0803    SLP Stop Time   0845    SLP Time Calculation (min)  42 min       Past Medical History:  Diagnosis Date  . Arthritis    hands  . BPH (benign prostatic hyperplasia)   . Elevated PSA   . Foley catheter in place   . History of adenomatous polyp of colon    2014  tubular adenoma  . History of malignant melanoma    s/p  excision left forearm and left axill lymph node bx 09-10-2004  . Hyperlipidemia   . Hypertension   . Peripheral vascular disease ([place])   . Urinary retention     Past Surgical History:  Procedure Laterality Date  . ANAL FISSURE REPAIR  1984  . COLONOSCOPY N/A 05/02/2013   Procedure: COLONOSCOPY;  Surgeon: Garlan Fair, MD;  Location: [facility];  Service: Endoscopy;  Laterality: N/A;  . INSERTION OF SUPRAPUBIC CATHETER N/A 09/30/2015   Procedure: INSERTION OF SUPRAPUBIC CATHETER;  Surgeon: Kathie Rhodes, MD;  Location: [facility];  Service: Urology;  Laterality: N/A;  . SHOULDER ARTHROSCOPY W/ SUBACROMIAL DECOMPRESSION AND DISTAL CLAVICLE EXCISION Left 05-20-2010   w/  Debridement , Bursectomy, Acromioplasty,  Capsulectomy  . TRANSURETHRAL RESECTION OF PROSTATE N/A 09/30/2015   Procedure: TURP (TRANSURETHRAL RESECTION OF PROSTATE WITH GYRUS;  Surgeon: Kathie Rhodes, MD;  Location: [facility];  Service: Urology;  Laterality: N/A;  . WIDE EXCISION MALIGNANT MELANOMA LEFT  FOREARM/  LEFT AXILLA LYMPH NODE BX  09-10-2004    There were no vitals filed for this visit.  Subjective Assessment - 01/02/20 0806    Subjective  "It's going pretty good."    Currently in Pain?  No/denies            ADULT SLP TREATMENT - 01/02/20 0807      General Information   Behavior/Cognition  Pleasant mood;Cooperative;Alert      Treatment Provided   Treatment provided  Cognitive-Linquistic      Pain Assessment   Pain Assessment  No/denies pain      Cognitive-Linquistic Treatment   Treatment focused on  Dysarthria;Patient/family/caregiver education    Skilled Treatment  Loud "Hey" average 92 dB with min-mod cues to avoid glottal attack. Everday phrases average 75 dB. Structured sentence level reading tasks averaged 74 dB with some carryover to spontaneous responses. Increased cognitive load; pt required occasional min cues to maintain average in low 70s dB. Abdominal breathing in isolation 90% accuracy in seated position over 3 minute period. AB with sentences 100% accuracy after initial demonstration/instruction.      Assessment / Recommendations / Plan   Plan  Continue with current plan of care      Progression Toward Goals   Progression toward goals  Progressing toward goals         SLP Short Term Goals -  01/02/20 [id number]      SLP SHORT TERM GOAL #1   Title  pt will produce loud /a/ or "hey!" with at least low 90s dB average over three sessions    Baseline  12-19-19, 12-22-19, 12-26-19    Time  2    Period  Weeks    Status  Achieved      SLP SHORT TERM GOAL #2   Title  pt will generate abdominal breathing 80% of the time in the seated position over 2 sessions    Baseline  01/02/20    Time  1    Period  Weeks    Status  On-going      SLP SHORT TERM GOAL #3   Title  pt will generate abdominal breathing in 75% of sentence responses when answering simple questions over 2 sessions    Time  1    Period  Weeks    Status  On-going      SLP SHORT TERM GOAL #4    Title  pt will produce WNL speech volume in 3 minutes simple conversation over 2 sessions    Time  1    Period  Weeks    Status  On-going       SLP Long Term Goals - 01/02/20 [id number]      SLP LONG TERM GOAL #1   Title  pt will generate abdominal breathing 75% of the time in 10 minutes simple-mod complex conversation over 3 sessions    Time  4    Period  Weeks   or 15 total visits, for all LTGs   Status  On-going      SLP LONG TERM GOAL #2   Title  pt will produce WNL volume in 10 minutes simple to mod complex conversation over 3 visits    Time  4    Period  Weeks    Status  On-going       Plan - 01/02/20 0820    Clinical Impression Statement  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 Pt cont to have difficulty with AB at rest in seated position. Pt awareness of need to use louder voice in conversation improving, although continues to require education that he is not "too  loud". See "skilled intervention" for more details. Pt is moving towards meeting STGs. SLP believes pt will cont to benefit from skilled ST targeting incr'd volume and greater usage of abdomoinal breathing in order to improve communicative effectiveness.    Speech Therapy Frequency  2x / week    Duration  --   7 weeks or 15 sessions   Treatment/Interventions  Functional tasks;SLP instruction and feedback;Compensatory strategies;Patient/family education;Internal/external aids;Cueing hierarchy    Potential to Achieve Goals  Good       Patient will benefit from skilled therapeutic intervention in order to improve the following deficits and impairments:   Dysarthria and anarthria    Problem List Patient Active Problem List   Diagnosis Date Noted  . Parkinson's disease ([place]) 04/13/2017  . BPH (benign prostatic hypertrophy) with urinary retention 09/30/2015   Deneise Lever, [place], [place] 01/02/2020, 8:45 AM  [facility] [address] [place], [place], [address] Phone: [phone]   Fax:  [phone]   Name: Zachary Wang. MRN: [id number] Date of Birth: 05-30-1941

## 2020-01-04 ENCOUNTER — Encounter: Payer: No Typology Code available for payment source | Admitting: Speech Pathology

## 2020-01-09 ENCOUNTER — Ambulatory Visit: Payer: No Typology Code available for payment source

## 2020-01-09 ENCOUNTER — Other Ambulatory Visit: Payer: Self-pay

## 2020-01-09 DIAGNOSIS — R471 Dysarthria and anarthria: Secondary | ICD-10-CM

## 2020-01-09 DIAGNOSIS — R490 Dysphonia: Secondary | ICD-10-CM

## 2020-01-09 NOTE — Therapy (Signed)
[facility] [address] [place], [place], [address] Phone: [phone]   Fax:  [phone]  Speech Language Pathology Treatment  Patient Details  Name: Zachary Wang. MRN: [id number] Date of Birth: Jul 24, 1941 Referring Provider (SLP): Alonza Bogus, DO   Encounter Date: 01/09/2020  End of Session - 01/09/20 0846    Visit Number  9    Number of Visits  16    Date for SLP Re-Evaluation  02/16/20    Authorization Type  [facility]    Authorization - Visit Number  8    Authorization - Number of Visits  30    SLP Start Time  2122    SLP Stop Time   0845    SLP Time Calculation (min)  41 min    Activity Tolerance  Patient tolerated treatment well       Past Medical History:  Diagnosis Date  . Arthritis    hands  . BPH (benign prostatic hyperplasia)   . Elevated PSA   . Foley catheter in place   . History of adenomatous polyp of colon    2014  tubular adenoma  . History of malignant melanoma    s/p  excision left forearm and left axill lymph node bx 09-10-2004  . Hyperlipidemia   . Hypertension   . Peripheral vascular disease ([place])   . Urinary retention     Past Surgical History:  Procedure Laterality Date  . ANAL FISSURE REPAIR  1984  . COLONOSCOPY N/A 05/02/2013   Procedure: COLONOSCOPY;  Surgeon: Garlan Fair, MD;  Location: [facility];  Service: Endoscopy;  Laterality: N/A;  . INSERTION OF SUPRAPUBIC CATHETER N/A 09/30/2015   Procedure: INSERTION OF SUPRAPUBIC CATHETER;  Surgeon: Kathie Rhodes, MD;  Location: [facility];  Service: Urology;  Laterality: N/A;  . SHOULDER ARTHROSCOPY W/ SUBACROMIAL DECOMPRESSION AND DISTAL CLAVICLE EXCISION Left 05-20-2010   w/  Debridement , Bursectomy, Acromioplasty,  Capsulectomy  . TRANSURETHRAL RESECTION OF PROSTATE N/A 09/30/2015   Procedure: TURP (TRANSURETHRAL RESECTION OF PROSTATE WITH GYRUS;  Surgeon: Kathie Rhodes, MD;  Location: [facility];  Service: Urology;  Laterality: N/A;  . WIDE EXCISION MALIGNANT MELANOMA LEFT FOREARM/  LEFT AXILLA LYMPH NODE BX  09-10-2004    There were no vitals filed for this visit.         ADULT SLP TREATMENT - 01/09/20 0810      General Information   Behavior/Cognition  Alert;Cooperative;Pleasant mood      Treatment Provided   Treatment provided  Cognitive-Linquistic      Cognitive-Linquistic Treatment   Treatment focused on  Dysarthria;Patient/family/caregiver education    Skilled Treatment  Pt's "hey" average lower 90s dB - SLP extended pt's "hey" to have pt think of "seeing a good friend from your past in the parking lot that you haven't seen in 10 years" Pt with WNL vocal quality without vocal strain with these ~2 second productions. Told pt to do one set of "hey" with shorter productions (pt productions were shaped for "Heyy" (~ second productions) instead of short stacatto "HEY!" (~ 1/4 second) productions) and his other set ~2 second productions. Pt read paragraphs for approx 30-40 seconds with WNL volumes. However, little carryover of louder speech to conversation between trials.       Assessment / Recommendations / Plan   Plan  Continue with current plan of care      Progression Toward Goals   Progression toward goals  Progressing  toward goals         SLP Short Term Goals - 01/09/20 1339      SLP SHORT TERM GOAL #1   Title  pt will produce loud /a/ or "hey!" with at least low 90s dB average over three sessions    Baseline  12-19-19, 12-22-19, 12-26-19    Time  1    Period  Weeks    Status  Achieved      SLP SHORT TERM GOAL #2   Title  pt will generate abdominal breathing 80% of the time in the seated position over 2 sessions    Baseline  01/02/20    Time  1    Period  Weeks    Status  Partially Met      SLP SHORT TERM GOAL #3   Title  pt will generate abdominal breathing in 75% of sentence responses when answering simple questions over 2 sessions    Time  1     Period  Weeks    Status  Partially Met      SLP SHORT TERM GOAL #4   Title  pt will produce WNL speech volume in 3 minutes simple conversation over 2 sessions    Time  1    Period  Weeks    Status  Partially Met       SLP Long Term Goals - 01/09/20 1339      SLP LONG TERM GOAL #1   Title  pt will generate abdominal breathing 75% of the time in 10 minutes simple-mod complex conversation over 3 sessions    Time  3    Period  Weeks   or 15 total visits, for all LTGs   Status  On-going      SLP LONG TERM GOAL #2   Title  pt will produce WNL volume in 10 minutes simple to mod complex conversation over 3 visits    Time  3    Period  Weeks    Status  On-going       Plan - 01/09/20 0902    Clinical Impression Statement  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 Pt awareness of need to use louder voice in conversation improving, although continues to require education that he is not "too  loud". See "skilled intervention" for more details. Pt is moving towards meeting STGs. SLP believes pt will cont to benefit from skilled ST targeting incr'd volume and greater usage of abdomoinal breathing in order to improve communicative effectiveness.    Speech Therapy Frequency  2x / week    Duration  --   7 weeks or 15 sessions   Treatment/Interventions  Functional tasks;SLP instruction and feedback;Compensatory strategies;Patient/family education;Internal/external aids;Cueing hierarchy    Potential to Achieve Goals  Good       Patient will benefit from skilled therapeutic intervention in order to improve the following deficits and impairments:   Dysarthria and anarthria  Dysphonia    Problem List Patient Active Problem List   Diagnosis Date Noted  . Parkinson's disease ([place]) 04/13/2017  . BPH (benign prostatic hypertrophy) with urinary retention  09/30/2015    [facility] ,MS, CCC-SLP  01/09/2020, 1:40 PM  [facility] [address] [place], [place], [address] Phone: [phone]   Fax:  [phone]   Name: Zachary Wang. MRN: [id number] Date of Birth: Jun 29, 1941

## 2020-01-11 ENCOUNTER — Ambulatory Visit: Payer: No Typology Code available for payment source | Admitting: Speech Pathology

## 2020-01-11 ENCOUNTER — Other Ambulatory Visit: Payer: Self-pay

## 2020-01-11 DIAGNOSIS — R471 Dysarthria and anarthria: Secondary | ICD-10-CM | POA: Diagnosis not present

## 2020-01-11 DIAGNOSIS — G20 Parkinson's disease: Secondary | ICD-10-CM

## 2020-01-11 NOTE — Therapy (Signed)
[facility] [address] [place], [place], [address] Phone: [phone]   Fax:  [phone]  Speech Language Pathology Treatment and Progress Note  Patient Details  Name: Zachary Wang. MRN: [id number] Date of Birth: 1941-05-15 Referring Provider (SLP): Alonza Bogus, DO   Encounter Date: 01/11/2020  End of Session - 01/11/20 0828    Visit Number  10    Number of Visits  16    Date for SLP Re-Evaluation  02/16/20    Authorization Type  [facility]    Authorization - Visit Number  8    Authorization - Number of Visits  15    SLP Start Time  0806    SLP Stop Time   0846    SLP Time Calculation (min)  40 min    Activity Tolerance  Patient tolerated treatment well       Past Medical History:  Diagnosis Date  . Arthritis    hands  . BPH (benign prostatic hyperplasia)   . Elevated PSA   . Foley catheter in place   . History of adenomatous polyp of colon    2014  tubular adenoma  . History of malignant melanoma    s/p  excision left forearm and left axill lymph node bx 09-10-2004  . Hyperlipidemia   . Hypertension   . Peripheral vascular disease ([place])   . Urinary retention     Past Surgical History:  Procedure Laterality Date  . ANAL FISSURE REPAIR  1984  . COLONOSCOPY N/A 05/02/2013   Procedure: COLONOSCOPY;  Surgeon: Garlan Fair, MD;  Location: [facility];  Service: Endoscopy;  Laterality: N/A;  . INSERTION OF SUPRAPUBIC CATHETER N/A 09/30/2015   Procedure: INSERTION OF SUPRAPUBIC CATHETER;  Surgeon: Kathie Rhodes, MD;  Location: [facility];  Service: Urology;  Laterality: N/A;  . SHOULDER ARTHROSCOPY W/ SUBACROMIAL DECOMPRESSION AND DISTAL CLAVICLE EXCISION Left 05-20-2010   w/  Debridement , Bursectomy, Acromioplasty,  Capsulectomy  . TRANSURETHRAL RESECTION OF PROSTATE N/A 09/30/2015   Procedure: TURP (TRANSURETHRAL RESECTION OF PROSTATE WITH GYRUS;  Surgeon: Kathie Rhodes, MD;  Location:  [facility];  Service: Urology;  Laterality: N/A;  . WIDE EXCISION MALIGNANT MELANOMA LEFT FOREARM/  LEFT AXILLA LYMPH NODE BX  09-10-2004    There were no vitals filed for this visit.  Subjective Assessment - 01/11/20 0809    Subjective  "Took my trip," (WNL volume)    Currently in Pain?  No/denies            ADULT SLP TREATMENT - 01/11/20 0809      General Information   Behavior/Cognition  Alert;Cooperative;Pleasant mood      Treatment Provided   Treatment provided  Cognitive-Linquistic      Pain Assessment   Pain Assessment  No/denies pain      Cognitive-Linquistic Treatment   Treatment focused on  Dysarthria;Patient/family/caregiver education      Assessment / Recommendations / Plan   Plan  Continue with current plan of care      Progression Toward Goals   Progression toward goals  Progressing toward goals       SLP Education - 01/11/20 0827    Education Details  use same effort with /a/ in conversation    Person(s) Educated  Patient    Methods  Explanation    Comprehension  Verbalized understanding;Verbal cues required;Need further instruction       SLP Short Term Goals - 01/11/20 6378  SLP SHORT TERM GOAL #1   Title  pt will produce loud /a/ or "hey!" with at least low 90s dB average over three sessions    Baseline  12-19-19, 12-22-19, 12-26-19    Time  1    Period  Weeks    Status  Achieved      SLP SHORT TERM GOAL #2   Title  pt will generate abdominal breathing 80% of the time in the seated position over 2 sessions    Baseline  01/02/20    Time  1    Period  Weeks    Status  Partially Met      SLP SHORT TERM GOAL #3   Title  pt will generate abdominal breathing in 75% of sentence responses when answering simple questions over 2 sessions    Time  1    Period  Weeks    Status  Partially Met      SLP SHORT TERM GOAL #4   Title  pt will produce WNL speech volume in 3 minutes simple conversation over 2 sessions    Time  1     Period  Weeks    Status  Partially Met       SLP Long Term Goals - 01/11/20 4656      SLP LONG TERM GOAL #1   Title  pt will generate abdominal breathing 75% of the time in 10 minutes simple-mod complex conversation over 3 sessions    Time  3    Period  Weeks   or 15 total visits, for all LTGs   Status  On-going      SLP LONG TERM GOAL #2   Title  pt will produce WNL volume in 10 minutes simple to mod complex conversation over 3 visits    Time  3    Period  Weeks    Status  On-going       Plan - 01/11/20 8127    Clinical Impression Statement  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 Pt awareness of need to use louder voice in conversation improving, although continues to require education that he is not "too  loud". See "skilled intervention" for more details. Pt is moving towards meeting STGs. SLP believes pt will cont to benefit from skilled ST targeting incr'd volume and greater usage of abdominal breathing in order to improve communicative effectiveness.    Speech Therapy Frequency  2x / week    Duration  --   7 weeks or 15 sessions   Treatment/Interventions  Functional tasks;SLP instruction and feedback;Compensatory strategies;Patient/family education;Internal/external aids;Cueing hierarchy    Potential to Achieve Goals  Good       Patient will benefit from skilled therapeutic intervention in order to improve the following deficits and impairments:   Dysarthria and anarthria  Parkinson's disease [facility])    Problem List Patient Active Problem List   Diagnosis Date Noted  . Parkinson's disease ([place]) 04/13/2017  . BPH (benign prostatic hypertrophy) with urinary retention 09/30/2015   Speech Therapy Progress Note  Dates of Reporting Period: 11/28/19 to 01/11/20  Patient has been seen for 10 speech therapy sessions targeting  hypokinetic dysarthria. Continues to make progress toward goals and will benefit from continued ST to improve vocal quality and . See goals and clinical impressions above for details.     Deneise Lever, [place], [place] Speech-Language Pathologist  Aliene Altes  01/11/2020, 8:30 AM  [facility] [address] [place], [place], [address] Phone: [phone]   Fax:  [phone]   Name: Krue Peterka. MRN: [id number] Date of Birth: 08/01/1941

## 2020-01-16 ENCOUNTER — Ambulatory Visit: Payer: No Typology Code available for payment source | Attending: Internal Medicine | Admitting: Speech Pathology

## 2020-01-16 ENCOUNTER — Other Ambulatory Visit: Payer: Self-pay

## 2020-01-16 DIAGNOSIS — R471 Dysarthria and anarthria: Secondary | ICD-10-CM | POA: Insufficient documentation

## 2020-01-16 DIAGNOSIS — G20 Parkinson's disease: Secondary | ICD-10-CM | POA: Diagnosis present

## 2020-01-16 NOTE — Therapy (Signed)
[facility] [address] [place], [place], [address] Phone: [phone]   Fax:  [phone]  Speech Language Pathology Treatment  Patient Details  Name: Zachary Wang. MRN: [id number] Date of Birth: 1940-08-30 Referring Provider (SLP): Alonza Bogus, DO   Encounter Date: 01/16/2020  End of Session - 01/16/20 0851    Visit Number  11    Number of Visits  16    Date for SLP Re-Evaluation  02/16/20    Authorization Type  VA    Authorization - Visit Number  10    Authorization - Number of Visits  15    SLP Start Time  [phone]    SLP Stop Time   0930    SLP Time Calculation (min)  43 min    Activity Tolerance  Patient tolerated treatment well       Past Medical History:  Diagnosis Date  . Arthritis    hands  . BPH (benign prostatic hyperplasia)   . Elevated PSA   . Foley catheter in place   . History of adenomatous polyp of colon    2014  tubular adenoma  . History of malignant melanoma    s/p  excision left forearm and left axill lymph node bx 09-10-2004  . Hyperlipidemia   . Hypertension   . Peripheral vascular disease ([place])   . Urinary retention     Past Surgical History:  Procedure Laterality Date  . ANAL FISSURE REPAIR  1984  . COLONOSCOPY N/A 05/02/2013   Procedure: COLONOSCOPY;  Surgeon: Garlan Fair, MD;  Location: [facility];  Service: Endoscopy;  Laterality: N/A;  . INSERTION OF SUPRAPUBIC CATHETER N/A 09/30/2015   Procedure: INSERTION OF SUPRAPUBIC CATHETER;  Surgeon: Kathie Rhodes, MD;  Location: [facility];  Service: Urology;  Laterality: N/A;  . SHOULDER ARTHROSCOPY W/ SUBACROMIAL DECOMPRESSION AND DISTAL CLAVICLE EXCISION Left 05-20-2010   w/  Debridement , Bursectomy, Acromioplasty,  Capsulectomy  . TRANSURETHRAL RESECTION OF PROSTATE N/A 09/30/2015   Procedure: TURP (TRANSURETHRAL RESECTION OF PROSTATE WITH GYRUS;  Surgeon: Kathie Rhodes, MD;  Location: [facility];  Service: Urology;  Laterality: N/A;  . WIDE EXCISION MALIGNANT MELANOMA LEFT FOREARM/  LEFT AXILLA LYMPH NODE BX  09-10-2004    There were no vitals filed for this visit.  Subjective Assessment - 01/16/20 0855    Subjective  "I do think I've gained something."    Currently in Pain?  No/denies            ADULT SLP TREATMENT - 01/16/20 0902      General Information   Behavior/Cognition  Alert;Cooperative;Pleasant mood      Treatment Provided   Treatment provided  Cognitive-Linquistic      Pain Assessment   Pain Assessment  No/denies pain      Cognitive-Linquistic Treatment   Treatment focused on  Dysarthria;Patient/family/caregiver education    Skilled Treatment  Patient questioning whether all remaining ST sessions are necessary. SLP discussed plan with patient and that SLP feels pt's challenge will be carryover from structured tasks in quiet environment to general conversation and pt agreed with this. Pt would like to work on this for this week's sessions; will decide on Thursday if he would like to keep 2 remaining appointments. Pt had to repeat his drive-through order x3, but did not consider that he was not loud/clear enough. SLP added pt's order to his list of everyday phrases and had pt practice this with abdominal breathing (  mod cues necessary). Pt noting "scratchy" voice with general conversation and SLP told pt this should be his clue that he needs to breathe and talk louder. SLP took pt outside and with competing traffic noise pt increased intensity and improved vocal quality in simple conversation; intelligibility was 100% in mod noisy environment. SLP instructed pt to continue using this same effort in conversation when returning to the building, however pt immediately dropped effort when returning inside and vocal quality deteriorated.       Assessment / Recommendations / Plan   Plan  Continue with current plan of care      Progression Toward Goals    Progression toward goals  Progressing toward goals         SLP Short Term Goals - 01/16/20 0902      SLP SHORT TERM GOAL #1   Title  pt will produce loud /a/ or "hey!" with at least low 90s dB average over three sessions    Baseline  12-19-19, 12-22-19, 12-26-19    Time  1    Period  Weeks    Status  Achieved      SLP SHORT TERM GOAL #2   Title  pt will generate abdominal breathing 80% of the time in the seated position over 2 sessions    Baseline  01/02/20    Time  1    Period  Weeks    Status  Partially Met      SLP SHORT TERM GOAL #3   Title  pt will generate abdominal breathing in 75% of sentence responses when answering simple questions over 2 sessions    Time  1    Period  Weeks    Status  Partially Met      SLP SHORT TERM GOAL #4   Title  pt will produce WNL speech volume in 3 minutes simple conversation over 2 sessions    Time  1    Period  Weeks    Status  Partially Met       SLP Long Term Goals - 01/16/20 5784      SLP LONG TERM GOAL #1   Title  pt will generate abdominal breathing 75% of the time in 10 minutes simple-mod complex conversation over 3 sessions    Time  2    Period  Weeks   or 15 total visits, for all LTGs   Status  On-going      SLP LONG TERM GOAL #2   Title  pt will produce WNL volume in 10 minutes simple to mod complex conversation over 3 visits    Time  2    Period  Weeks    Status  On-going       Plan - 01/16/20 1251    Clinical Impression Statement  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 Pt awareness of need to use louder voice in conversation improving, although continues to require education that he is not "too  loud". See "skilled intervention" for more details. Pt considering whether he would like to continue ST after this week. SLP believes pt will cont to benefit from skilled ST targeting  incr'd volume and greater usage of abdominal breathing in order to improve communicative effectiveness.    Speech Therapy Frequency  2x / week    Duration  --   7 weeks or 15 sessions   Treatment/Interventions  Functional tasks;SLP  instruction and feedback;Compensatory strategies;Patient/family education;Internal/external aids;Cueing hierarchy    Potential to Achieve Goals  Good       Patient will benefit from skilled therapeutic intervention in order to improve the following deficits and impairments:   Dysarthria and anarthria    Problem List Patient Active Problem List   Diagnosis Date Noted  . Parkinson's disease ([place]) 04/13/2017  . BPH (benign prostatic hypertrophy) with urinary retention 09/30/2015   Deneise Lever, [place], [place] 01/16/2020, 12:52 PM  [facility] [address] [place], [place], [address] Phone: [phone]   Fax:  [phone]   Name: Zachary Wang. MRN: [id number] Date of Birth: 11/19/1940

## 2020-01-18 ENCOUNTER — Other Ambulatory Visit: Payer: Self-pay

## 2020-01-18 ENCOUNTER — Ambulatory Visit: Payer: No Typology Code available for payment source | Admitting: Speech Pathology

## 2020-01-18 DIAGNOSIS — R471 Dysarthria and anarthria: Secondary | ICD-10-CM

## 2020-01-18 DIAGNOSIS — G20 Parkinson's disease: Secondary | ICD-10-CM

## 2020-01-18 NOTE — Therapy (Signed)
[facility] [address] [place], [place], [address] Phone: [phone]   Fax:  [phone]  Speech Language Pathology Treatment and Discharge Summary  Patient Details  Name: Zachary Wang. MRN: [id number] Date of Birth: 03/22/1941 Referring Provider (SLP): Alonza Bogus, DO   Encounter Date: 01/18/2020  End of Session - 01/18/20 1306    Visit Number  12    Number of Visits  16    Date for SLP Re-Evaluation  02/16/20    Authorization Type  VA    Authorization - Visit Number  11    Authorization - Number of Visits  88    SLP Start Time  1157    SLP Stop Time   0845    SLP Time Calculation (min)  41 min       Past Medical History:  Diagnosis Date  . Arthritis    hands  . BPH (benign prostatic hyperplasia)   . Elevated PSA   . Foley catheter in place   . History of adenomatous polyp of colon    2014  tubular adenoma  . History of malignant melanoma    s/p  excision left forearm and left axill lymph node bx 09-10-2004  . Hyperlipidemia   . Hypertension   . Peripheral vascular disease ([place])   . Urinary retention     Past Surgical History:  Procedure Laterality Date  . ANAL FISSURE REPAIR  1984  . COLONOSCOPY N/A 05/02/2013   Procedure: COLONOSCOPY;  Surgeon: Garlan Fair, MD;  Location[facility];  Service: Endoscopy;  Laterality: N/A;  . INSERTION OF SUPRAPUBIC CATHETER N/A 09/30/2015   Procedure: INSERTION OF SUPRAPUBIC CATHETER;  Surgeon: Kathie Rhodes, MD;  [facility];  Service: Urology;  Laterality: N/A;  . SHOULDER ARTHROSCOPY W/ SUBACROMIAL DECOMPRESSION AND DISTAL CLAVICLE EXCISION Left 05-20-2010   w/  Debridement , Bursectomy, Acromioplasty,  Capsulectomy  . TRANSURETHRAL RESECTION OF PROSTATE N/A 09/30/2015   Procedure: TURP (TRANSURETHRAL RESECTION OF PROSTATE WITH GYRUS;  Surgeon: Kathie Rhodes, MD;  Location: [facility];  Service: Urology;   Laterality: N/A;  . WIDE EXCISION MALIGNANT MELANOMA LEFT FOREARM/  LEFT AXILLA LYMPH NODE BX  09-10-2004    There were no vitals filed for this visit.  Subjective Assessment - 01/18/20 0808    Subjective  "I guess I had in my head 10 days."    Currently in Pain?  No/denies            ADULT SLP TREATMENT - 01/18/20 1300      General Information   Behavior/Cognition  Alert;Cooperative;Pleasant mood      Treatment Provided   Treatment provided  Cognitive-Linquistic      Pain Assessment   Pain Assessment  No/denies pain      Cognitive-Linquistic Treatment   Treatment focused on  Dysarthria;Patient/family/caregiver education    Skilled Treatment  Pt requests d/c today. Focused session on maintenance program and carryover. Pt completed loud, "Hey" and extended "Hey" average 93dB at 30cm. Pt used same effort (7/10) in simple conversation which SLP played back to pt for feedback; average was low 70s dB with occasional dips at ends of breath groups. Pt agreed even with effort his voice was softer than SLP's when hearing playback. SLP walked with pt around building and vocal quality/intensity was functional when pt had competing background noise. Returned inside for conversation in hallway and pt required min cues x 2 to use same effort as outdoors.  Pt in agreement to return for PD screen in August.       Assessment / Recommendations / Plan   Plan  Discharge SLP treatment due to (comment)   pt request     Progression Toward Goals   Progression toward goals  --   goals partially met      SLP Education - 01/18/20 1305    Education Details  If voice sounds hoarse, breathe and get loud, loud "hey" 5 times daily for maintenance    Person(s) Educated  Patient    Methods  Explanation    Comprehension  Verbalized understanding       SLP Short Term Goals - 01/18/20 0812      SLP SHORT TERM GOAL #1   Title  pt will produce loud /a/ or "hey!" with at least low 90s dB average over  three sessions    Baseline  12-19-19, 12-22-19, 12-26-19    Time  1    Period  Weeks    Status  Achieved      SLP SHORT TERM GOAL #2   Title  pt will generate abdominal breathing 80% of the time in the seated position over 2 sessions    Baseline  01/02/20    Time  1    Period  Weeks    Status  Partially Met      SLP SHORT TERM GOAL #3   Title  pt will generate abdominal breathing in 75% of sentence responses when answering simple questions over 2 sessions    Time  1    Period  Weeks    Status  Partially Met      SLP SHORT TERM GOAL #4   Title  pt will produce WNL speech volume in 3 minutes simple conversation over 2 sessions    Time  1    Period  Weeks    Status  Partially Met       SLP Long Term Goals - 01/18/20 0813      SLP LONG TERM GOAL #1   Title  pt will generate abdominal breathing 75% of the time in 10 minutes simple-mod complex conversation over 3 sessions    Time  2    Period  Weeks   or 15 total visits, for all LTGs   Status  Not Met      SLP LONG TERM GOAL #2   Title  pt will produce WNL volume in 10 minutes simple to mod complex conversation over 3 visits    Time  2    Period  Weeks    Status  Partially Met       Plan - 01/18/20 1306    Clinical Impression Statement  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 Pt awareness of need to use louder voice in conversation has improved, and he had some carryover in conversation outside [place] room today, though cues remain necessary occasionally. See "skilled intervention" for more details. Pt is discharged today at his request; he is to continue loud "hey" for maintenance daily and return for screen in August.    Speech Therapy Frequency  --   d/c   Duration  --   d/c   Treatment/Interventions  Functional tasks;SLP instruction and feedback;Compensatory strategies;Patient/family  education;Internal/external aids;Cueing hierarchy    Potential to Achieve Goals  Good       Patient will benefit from skilled therapeutic  intervention in order to improve the following deficits and impairments:   Dysarthria and anarthria  Parkinson's disease [facility])    Problem List Patient Active Problem List   Diagnosis Date Noted  . Parkinson's disease ([place]) 04/13/2017  . BPH (benign prostatic hypertrophy) with urinary retention 09/30/2015   SPEECH THERAPY DISCHARGE SUMMARY  Visits from Start of Care: 12  Current functional level related to goals / functional outcomes: Patient is discharged today at his request. He has made gains in vocal quality and intensity in structured tasks, and has had some carryover to conversation though cues remain necessary for this. Pt continues to require education that he is not "too loud," though his awareness of this has improved.   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  Patient goals were partially met. Patient is being discharged due to the patient's request.  ?????         Deneise Lever, [place], CCC-SLP Speech-Language Pathologist  Aliene Altes 01/18/2020, 1:09 PM  [facility] [address] [place], [place], [address] Phone: [phone]   Fax:  [phone]   Name: Woodson Macha. MRN: [id number] Date of Birth: 08-29-1940

## 2020-01-18 NOTE — Patient Instructions (Signed)
Practice your loud "Heeey" with belly breath, 5x a day   Think about using effort level of 7/10. Remember to use this when you talk, too!  If your voice sounds hoarse, you are not loud enough. Breathe and GET LOUD!

## 2020-01-25 ENCOUNTER — Encounter: Payer: No Typology Code available for payment source | Admitting: Speech Pathology

## 2020-02-15 ENCOUNTER — Ambulatory Visit (INDEPENDENT_AMBULATORY_CARE_PROVIDER_SITE_OTHER): Payer: No Typology Code available for payment source | Admitting: Neurology

## 2020-02-15 ENCOUNTER — Other Ambulatory Visit: Payer: Self-pay

## 2020-02-15 DIAGNOSIS — K117 Disturbances of salivary secretion: Secondary | ICD-10-CM | POA: Diagnosis not present

## 2020-02-15 MED ORDER — RIMABOTULINUMTOXINB 5000 UNIT/ML IM SOLN
5000.0000 [IU] | Freq: Once | INTRAMUSCULAR | Status: AC
  Administered 2020-02-15: 5000 [IU] via INTRAMUSCULAR

## 2020-02-15 NOTE — Procedures (Signed)
[facility]    History:  Diagnosis: Sialorrhea    Result History  Working well.    Consent obtained from: The patient The patient was educated on the botulinum toxin the black blox warning and given a copy of the botox patient medication guide.  The patient understands that this warning states that there have been reported cases of the Botox extending beyond the injection site and creating adverse effects, similar to those of botulism. This included loss of strength, trouble walking, hoarseness, trouble saying words clearly, loss of bladder control, trouble breathing, trouble swallowing, diplopia, blurry vision and ptosis. Most of the distant spread of Botox was happening in patients, primarily children, who received medication for spasticity or for cervical dystonia. The patient expressed understanding and desire to proceed.     Injections  Location Left  Right Units Number of sites  Submandibular gland 250 250 500 1 per side  Parotid 2250 2250 2500 1 per side  TOTAL UNITS:     5000      Type of Toxin: Myobloc type B As ordered and injected IM at today's visit Total Units: 5000  Discarded Units: 0  Needle drawback with each injection was free of blood. Pt tolerated procedure well without complications.   Reinjection is anticipated in 3 months.                 

## 2020-03-08 ENCOUNTER — Other Ambulatory Visit: Payer: Self-pay | Admitting: Neurology

## 2020-03-08 MED ORDER — CARBIDOPA-LEVODOPA 25-100 MG PO TABS: ORAL_TABLET | ORAL | 1 refills | Status: DC

## 2020-03-08 NOTE — Addendum Note (Signed)
Addended by: Ladell Pier A on: 03/08/2020 09:37 AM   Modules accepted: Orders

## 2020-03-08 NOTE — Telephone Encounter (Signed)
Patient called in needing a refill of his carbidopa-levodopa sent to the [facility] in [place]. Their phone number is [phone].

## 2020-03-26 NOTE — Progress Notes (Signed)
Assessment/Plan:   1.  Parkinsons Disease  -Continue carbidopa/levodopa 25/100, 2/2/1  -Continue pramipexole ER, 1.5 mg daily.  -having cramping of feet and legs at night.  Add carbidopa/levodopa 50/200 cr q hs.  -discussed his genetic testing done through a genetic trial.  He was heterozygous for GBA mutation.  Discussed what that meant.  Discussed that this could provide a risk factor for inherited Parkinson's, but likely also requires an environmental component.  Discussed that a homozygous carrier would have a much greater risk factor (10-20 times).  2.  History of malignant melanoma  -Follows regularly with dermatology.  Has appt 8/23.  Understands Parkinson's also increases risk for melanoma.  3.  Dizziness  -On multiple blood pressure medications.  Primary care has been monitoring.  Discussed concept of permissive HTN.  Has appt with PCP in sept 23  4.  Sialorrhea  -Receiving Botox.  5.  Follow up is anticipated in the next 4-6 months, sooner should new neurologic issues arise. Subjective:   Zachary Wang. was seen today in follow up for Parkinsons disease.  My previous records were reviewed prior to todays visit as well as outside records available to me.  Patient has been going to the neuro rehab center for speech therapy and has completed that.  Pt denies falls.  Pt has had lightheadedness but no near syncope.  Pt monitoring BP at home - generally in 120's.   Having cramping of feet/legs qo night.   No hallucinations.  Mood has been good.  Last Botox was on July 1 for drooling.  Current prescribed movement disorder medications: Carbidopa/levodopa 25/100, 2/2/1 Pramipexole ER, 1.5 mg daily   ALLERGIES:  No Known Allergies  CURRENT MEDICATIONS:  Outpatient Encounter Medications as of 03/27/2020  Medication Sig  . amLODipine (NORVASC) 5 MG tablet Take 5 mg by mouth every morning.  Marland Kitchen atorvastatin (LIPITOR) 20 MG tablet Take 20 mg by mouth every evening.   .  carbidopa-levodopa (SINEMET IR) 25-100 MG tablet 2 at 7am, 2 at 11am, 1 at 4pm  . hydrochlorothiazide (HYDRODIURIL) 25 MG tablet Take 25 mg by mouth every morning.   Marland Kitchen losartan (COZAAR) 100 MG tablet Take 50 mg by mouth 2 (two) times daily.  . Pramipexole Dihydrochloride 1.5 MG TB24 Take 1 tablet (1.5 mg total) by mouth 1 day or 1 dose. (Patient taking differently: Take 1 tablet by mouth daily. )  . [DISCONTINUED] DUREZOL 0.05 % EMUL Place 1 drop into the right eye. 1 drop in right eye four times a day 2 days before surgery (Patient not taking: Reported on 03/27/2020)  . [DISCONTINUED] moxifloxacin (VIGAMOX) 0.5 % ophthalmic solution 1 drop in right eye four times a day 2 days before surgery (Patient not taking: Reported on 03/27/2020)   No facility-administered encounter medications on file as of 03/27/2020.    Objective:   PHYSICAL EXAMINATION:    VITALS:   Vitals:   03/27/20 0804  BP: 136/72  Pulse: 68  SpO2: 98%  Weight: 222 lb (100.7 kg)  Height: 6\' 2"  (1.88 m)    GEN:  The patient appears stated age and is in NAD. HEENT:  Normocephalic, atraumatic.  The mucous membranes are moist. The superficial temporal arteries are without ropiness or tenderness. CV:  RRR Lungs:  CTAB Neck/HEME:  There are no carotid bruits bilaterally.  Neurological examination:  Orientation: The patient is alert and oriented x3. Cranial nerves: There is good facial symmetry with min facial hypomimia. The speech is fluent  and clear. Soft palate rises symmetrically and there is no tongue deviation. Hearing is intact to conversational tone. Sensation: Sensation is intact to light touch throughout Motor: Strength is at least antigravity x4.  Movement examination: Tone: There is mild increased tone in the left upper extremity Abnormal movements: None Coordination:  There is mild decremation with RAM's, with hand opening and closing and finger taps on the left. Gait and Station: The patient has no  difficulty arising out of a deep-seated chair without the use of the hands. The patient's stride length is good.      Total time spent on today's visit was 40 minutes, including both face-to-face time and nonface-to-face time.  Time included that spent on review of records (prior notes available to me/labs/imaging if pertinent), discussing treatment and goals, answering patient's questions and coordinating care.  Cc:  Prince Solian, MD

## 2020-03-27 ENCOUNTER — Other Ambulatory Visit: Payer: Self-pay

## 2020-03-27 ENCOUNTER — Encounter: Payer: Self-pay | Admitting: Neurology

## 2020-03-27 ENCOUNTER — Ambulatory Visit (INDEPENDENT_AMBULATORY_CARE_PROVIDER_SITE_OTHER): Payer: No Typology Code available for payment source | Admitting: Neurology

## 2020-03-27 VITALS — BP 136/72 | HR 68 | Ht 74.0 in | Wt 222.0 lb

## 2020-03-27 DIAGNOSIS — G20 Parkinson's disease: Secondary | ICD-10-CM

## 2020-03-27 DIAGNOSIS — K117 Disturbances of salivary secretion: Secondary | ICD-10-CM | POA: Diagnosis not present

## 2020-03-27 MED ORDER — CARBIDOPA-LEVODOPA ER 50-200 MG PO TBCR: 1.0000 | EXTENDED_RELEASE_TABLET | Freq: Every day | ORAL | 1 refills | Status: DC

## 2020-03-28 ENCOUNTER — Ambulatory Visit: Payer: No Typology Code available for payment source

## 2020-03-28 ENCOUNTER — Telehealth: Payer: Self-pay | Admitting: Occupational Therapy

## 2020-03-28 ENCOUNTER — Ambulatory Visit: Payer: No Typology Code available for payment source | Admitting: Occupational Therapy

## 2020-03-28 ENCOUNTER — Ambulatory Visit: Payer: No Typology Code available for payment source | Attending: Internal Medicine | Admitting: Physical Therapy

## 2020-03-28 DIAGNOSIS — R278 Other lack of coordination: Secondary | ICD-10-CM | POA: Insufficient documentation

## 2020-03-28 DIAGNOSIS — R490 Dysphonia: Secondary | ICD-10-CM | POA: Insufficient documentation

## 2020-03-28 DIAGNOSIS — R471 Dysarthria and anarthria: Secondary | ICD-10-CM | POA: Insufficient documentation

## 2020-03-28 DIAGNOSIS — G20 Parkinson's disease: Secondary | ICD-10-CM

## 2020-03-28 DIAGNOSIS — R2689 Other abnormalities of gait and mobility: Secondary | ICD-10-CM | POA: Insufficient documentation

## 2020-03-28 NOTE — Telephone Encounter (Signed)
Tee, call patient before we place these orders as pt told me yesterday that he didn't want to proceed with ST but I think he did with OT

## 2020-03-28 NOTE — Therapy (Signed)
[facility] [address] [place], [place], [address] Phone: [phone]   Fax:  [phone]  Patient Details  Name: Zachary Wang. MRN: [id number] Date of Birth: [birth year] Referring Provider:  Prince Solian, MD  Encounter Date: 03/28/2020          Physical Therapy Parkinson's Disease Screen   Timed Up and Go test: 9.15 sec   10 meter walk test: 9.06 sec = 3.62 ft/sec  5 time sit to stand test:  11.72 sec   Patient does not require Physical Therapy services at this time.  Recommend Physical Therapy screen in 6-9 months.     Zachary Granquist W. 03/28/2020, 8:33 AM  Zachary Butt., PT   [facility] [address] [place], [place], [address] Phone: [phone]   Fax:  [phone]

## 2020-03-28 NOTE — Therapy (Signed)
[facility] [address] [place], [place], [address] Phone: [phone]   Fax:  [phone]  Patient Details  Name: Zachary Wang. MRN: [id number] Date of Birth: Oct 02, 1940 Referring Provider:  Prince Solian, MD  Encounter Date: 03/28/2020 Occupational Therapy Parkinson's Disease Screen  Hand dominance:  RUE  Fastening/unfastening 3 buttons in:  33.72sec  9-hole peg test:    RUE  32.22 sec        LUE  46.94 sec  Box & Blocks Test:   RUE  50 blocks        LUE  49 blocks   Other Comments:  Handwriting is legible and good letter size.  Pt would benefit from occupational therapy evaluation due to  Decline in coordination.   Bracy Pepper 03/28/2020, 11:11 AM  [facility] [address] [place], [place], [address] Phone: [phone]   Fax:  [phone]

## 2020-03-28 NOTE — Therapy (Signed)
[facility] [address] [place], [place], [address] Phone: [phone]   Fax:  [phone]  Patient Details  Name: Zachary Wang. MRN: [id number] Date of Birth: 12-05-1940 Referring Provider: Alonza Bogus, DO  Encounter Date: 03/28/2020  Speech Therapy Parkinson's Disease Screen   Decibel Level today: began in upper 60sdB  (WNL=70-72 dB) and as conversation progressed decreased to the mid 60s dB, with harsh, hoarse vocal quality increasing. Sound level meter 30cm away from pt's mouth. Pt's conversational volume has decreased since last treatment course in early summer 2021.  Pt has not experienced difficulty in swallowing warranting objective evaluation  Pt would benefit from speech-language eval for dysarthria  - please order via Epic. Thank you.    [facility] ,[place], [place]  03/28/2020, 8:28 AM  [facility] [address] [place], [place], [address] Phone: [phone]   Fax:  [phone]

## 2020-03-28 NOTE — Telephone Encounter (Signed)
Dr Carles Collet,  Delene Ruffini was seen for Parkinson's screens this morning. He can benefit from speech therapy and occupational therapy evaluations and treatment due to decreased speech volume and decline in coordination. We would like to see him 2x week x 3 weeks plus eval. If you agree please place these referrals.  Thanks, [employer], OTR/L

## 2020-03-29 NOTE — Telephone Encounter (Addendum)
Spoke with patient and he stated he would be interested in doing OT but not ST right now. He states we will need to contact the [facility] to get his visits approved, and informed him that he would need neuro rehab to get his visits approved like they did previously.   Patient states he contacted the [facility] to see if they received his new rx (carbidopa levodopa) but when he checked on line he did not see that it was ready. I advised patient to wait a few days then contact the pharmacy to make sure they received the rx. And to contact the office if they have not and we will resend the rx.   Patient voiced understanding.

## 2020-03-29 NOTE — Telephone Encounter (Signed)
Order placed

## 2020-03-29 NOTE — Addendum Note (Signed)
Addended by: Ulice Brilliant T on: 03/29/2020 03:22 PM   Modules accepted: Orders

## 2020-03-29 NOTE — Addendum Note (Signed)
Addended by: Ulice Brilliant T on: 03/29/2020 11:46 AM   Modules accepted: Orders

## 2020-03-29 NOTE — Telephone Encounter (Signed)
I think you can put in the order in epic but also print the order and give a copy to him and he can take to the [place] and get auth for the OT.  Dx:  Parkinsons Disease

## 2020-03-29 NOTE — Telephone Encounter (Signed)
Copy of referral printed and placed up front for patient to pickup. Patient stated he may not be able to get by the office until Monday.

## 2020-04-04 ENCOUNTER — Other Ambulatory Visit: Payer: Self-pay | Admitting: Neurology

## 2020-04-04 MED ORDER — CARBIDOPA-LEVODOPA ER 50-200 MG PO TBCR: 1.0000 | EXTENDED_RELEASE_TABLET | Freq: Every day | ORAL | 1 refills | Status: DC

## 2020-04-04 NOTE — Telephone Encounter (Signed)
Patient states his carbidopa levodopa 50-200mg  was supposed to be sent to the [facility] and he was told that they never received it. Please resend. Fax # [phone].

## 2020-04-04 NOTE — Telephone Encounter (Signed)
Spoke with patient and made him aware that Dr Tat has already sent the rx to the pharmacy. Advised patient to come by the office and pickup a hard copy of the rx and take it to his pharmacy. He voiced understanding.   Rx printed and placed in Dr Doristine Devoid folder to be signed.

## 2020-05-06 ENCOUNTER — Other Ambulatory Visit: Payer: Self-pay

## 2020-05-06 ENCOUNTER — Encounter: Payer: Self-pay | Admitting: Occupational Therapy

## 2020-05-06 ENCOUNTER — Ambulatory Visit: Payer: No Typology Code available for payment source | Attending: Internal Medicine | Admitting: Occupational Therapy

## 2020-05-06 DIAGNOSIS — R29898 Other symptoms and signs involving the musculoskeletal system: Secondary | ICD-10-CM | POA: Diagnosis present

## 2020-05-06 DIAGNOSIS — R2689 Other abnormalities of gait and mobility: Secondary | ICD-10-CM | POA: Diagnosis present

## 2020-05-06 DIAGNOSIS — R293 Abnormal posture: Secondary | ICD-10-CM | POA: Insufficient documentation

## 2020-05-06 DIAGNOSIS — R2681 Unsteadiness on feet: Secondary | ICD-10-CM | POA: Diagnosis present

## 2020-05-06 DIAGNOSIS — R29818 Other symptoms and signs involving the nervous system: Secondary | ICD-10-CM

## 2020-05-06 DIAGNOSIS — R278 Other lack of coordination: Secondary | ICD-10-CM | POA: Diagnosis present

## 2020-05-06 DIAGNOSIS — R251 Tremor, unspecified: Secondary | ICD-10-CM | POA: Diagnosis present

## 2020-05-06 NOTE — Therapy (Signed)
[facility] [address] [place], [place], [address] Phone: [phone]   Fax:  [phone]  Occupational Therapy Evaluation  Patient Details  Name: Zachary Wang. MRN: [id number] Date of Birth: 1940-10-26 Referring Provider (OT): Dr. Wells Guiles Tat   Encounter Date: 05/06/2020   OT End of Session - 05/06/20 1000    Visit Number 1    Number of Visits 9    Date for OT Re-Evaluation 06/05/20    Authorization Type VA approved 15 visits including eval through 08/02/20    Authorization Time Period --    Authorization - Visit Number 1    Authorization - Number of Visits 10    Progress Note Due on Visit 10    OT Start Time 0850    OT Stop Time 0933    OT Time Calculation (min) 43 min    Activity Tolerance Patient tolerated treatment well    Behavior During Therapy [facility] for tasks assessed/performed           Past Medical History:  Diagnosis Date  . Arthritis    hands  . BPH (benign prostatic hyperplasia)   . Elevated PSA   . Foley catheter in place   . History of adenomatous polyp of colon    2014  tubular adenoma  . History of malignant melanoma    s/p  excision left forearm and left axill lymph node bx 09-10-2004  . Hyperlipidemia   . Hypertension   . Peripheral vascular disease ([place])   . Urinary retention     Past Surgical History:  Procedure Laterality Date  . ANAL FISSURE REPAIR  1984  . COLONOSCOPY N/A 05/02/2013   Procedure: COLONOSCOPY;  Surgeon: Garlan Fair, MD;  Location: [facility];  Service: Endoscopy;  Laterality: N/A;  . INSERTION OF SUPRAPUBIC CATHETER N/A 09/30/2015   Procedure: INSERTION OF SUPRAPUBIC CATHETER;  Surgeon: Kathie Rhodes, MD;  Location: [facility];  Service: Urology;  Laterality: N/A;  . SHOULDER ARTHROSCOPY W/ SUBACROMIAL DECOMPRESSION AND DISTAL CLAVICLE EXCISION Left 05-20-2010   w/  Debridement , Bursectomy, Acromioplasty,  Capsulectomy  . TRANSURETHRAL  RESECTION OF PROSTATE N/A 09/30/2015   Procedure: TURP (TRANSURETHRAL RESECTION OF PROSTATE WITH GYRUS;  Surgeon: Kathie Rhodes, MD;  Location: [facility];  Service: Urology;  Laterality: N/A;  . WIDE EXCISION MALIGNANT MELANOMA LEFT FOREARM/  LEFT AXILLA LYMPH NODE BX  09-10-2004    There were no vitals filed for this visit.   Subjective Assessment - 05/06/20 0858    Subjective  buttons are taking longer    Pertinent History Parkinson's disease (03/2017 diagnosis).  PMH:  arthritis, hx of malignant melanoma, HDL, HTN, Peripheral vascular disease, hx of L shoudler arthroscopy 2011, hx of bilateral cataract surgery    Patient Stated Goals improve ADLs,  improve buttoning    Currently in Pain? No/denies             [facility] OT Assessment - 05/06/20 0001      Assessment   Medical Diagnosis Parkinson's Disease    Referring Provider (OT) Dr. 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), walking, yoga, PD cycle, stationary bike at home (occasionally)      ADL   Eating/Feeding Modified independent   difficulty opening packages, occasional opening bottles   Grooming Modified independent    Upper Body Bathing Modified independent    Lower Body Bathing Modified independent   min difficulty washing feet   Upper Body Dressing Increased time   difficulty taking off shirt, difficulty with buttons   Lower Body Dressing --   min difficulty, uses shoe horn   Toilet Transfer Modified independent    Toileting - Clothing Manipulation Modified independent     Toileting -  Building services engineer bars;Walk in shower    ADL comments reports hitting things by accident when scrolling on phone.  Pt reports difficulty getting in/out of bed and rolling in bed.        IADL   Prior Level of Function Light Housekeeping pt cleans floor, difficulty picking small things off floor.  does some yardwork.  (does not mow)    Prior Level of Function Meal Prep wife performs, pt makes eggs (difficulty flipping)    Programmer, applications own vehicle   pt denies Metallurgist financial matters independently (budgets, writes checks, pays rent, bills goes to bank), collects and keeps track of income   when typing hits extra keys with L hand     Mobility   Mobility Status Independent      Written Expression   Dominant Hand Right    Handwriting 100% legible   good size     Vision - History   Visual History Other (comment)   bilateral cataract surgery     Vision Assessment   Comment Pt reports occasional diplopia at night when lying down and watching tv, but able to reposition to correct      Cognition   Overall Cognitive Status Within Functional Limits for tasks assessed      Observation/Other Assessments   Standing Functional Reach Test R-15, L-14.5"    Simulated Eating Time (seconds) 11.32    Donning Doffing Jacket Time (seconds) Fastening/unfastening 3 buttons in 34.78 sec (L hand as a stabilizer, but not assisting)    Donning Doffing Jacket Comments 10.09      Posture/Postural Control   Posture/Postural Control Postural limitations    Postural Limitations Rounded Shoulders;Forward head      Coordination   Right 9 Hole Peg Test 28.75    Left 9 Hole Peg Test 38.66    Box and Blocks R-53blocks, L-47blocks    Tremors LUE>RUE (noted with action)       AROM   Overall AROM  Within functional limits for tasks performed    Overall AROM  Comments BUEs      RUE Tone   RUE Tone Within Functional Limits      LUE Tone   LUE Tone Mild                           OT Education - 05/06/20 1451    Education Details OT eval results/POC; education regarding possible visual changes associated with PD    Person(s) Educated Patient  Methods Explanation    Comprehension Verbalized understanding            OT Short Term Goals - 05/06/20 1007      OT SHORT TERM GOAL #1   Title --      OT SHORT TERM GOAL #2   Title --             OT Long Term Goals - 05/06/20 1008      OT LONG TERM GOAL #1   Title Pt will verbalize understanding of adaptive strategies to incr ease/efficiency with ADLs/IADLs (including buttoning, picking up items from floor, opening containers, flipping eggs).--check LTGs 06/05/20    Time 4    Period Weeks    Status New      OT LONG TERM GOAL #2   Title Pt will improve LUE functional reaching/coordination for ADLs as shown by improving score on box and blocks test by at least 3 blocks.    Baseline R-53 blocks, L-47 blocks    Time 4    Period Weeks    Status New      OT LONG TERM GOAL #3   Title Pt will improve L hand coordination for ADLs as shown by completing 9-hole peg test in less than 36sec    Baseline 38.66sec    Time 4    Period Weeks    Status New      OT LONG TERM GOAL #4   Title Pt will improve bilateral hand coordination as shown by fastening/unfastening 3 buttons in 31sec or less.    Baseline 34.78sec    Time 4    Period Weeks    Status New      OT LONG TERM GOAL #5   Title Pt will be independent with updates to PD-specific HEP.    Time 4    Period Weeks    Status New                 Plan - 05/06/20 1004    Clinical Impression Statement Pt is a 79 y.o. male with Parkinson's Disease.  Pt was diagnosed with PD 03/2017.  Pt was previously seen for OT 03/2019.   PD screen completed 03/28/20 indicated that pt may benefit from occupational therapy  evaluation due to decline in coordination.  PMH includes:arthritis, hx of malignant melanoma, HDL, HTN, Peripheral vascular disease, hx of L shoudler arthroscopy 2011, bilateral cataract surgery 10/2019.  Pt presents today with decr coordination, tremors, abnormal posture, decr balance for ADLs/IADLs, rigidity, bradykinesia.  Pt would benefit from occupational therapy for incr ease/efficiency with ADLs/IADLs, to update HEP, and decr risk of future complications related to PD for improved quality of life.    OT Occupational Profile and History Detailed Assessment- Review of Records and additional review of physical, cognitive, psychosocial history related to current functional performance    Occupational performance deficits (Please refer to evaluation for details): ADL's;IADL's;Leisure    Body Structure / Function / Physical Skills ADL;Dexterity;IADL;Balance;Mobility;Coordination;FMC;Tone;UE functional use;Decreased knowledge of use of DME;GMC;Endurance;Improper spinal/pelvic alignment;Flexibility   bradykinesia   Rehab Potential Good    Clinical Decision Making Several treatment options, min-mod task modification necessary    Comorbidities Affecting Occupational Performance: May have comorbidities impacting occupational performance    Modification or Assistance to Complete Evaluation  Min-Moderate modification of tasks or assist with assess necessary to complete eval    OT Frequency 2x / week    OT Duration 4 weeks   +eval or 9 visits   OT  Treatment/Interventions Self-care/ADL training;Moist Heat;Fluidtherapy;DME and/or AE instruction;Therapeutic activities;Aquatic Therapy;Therapeutic exercise;Functional Mobility Training;Neuromuscular education;Cryotherapy;Energy conservation;Manual Therapy;Patient/family education;Passive range of motion    Plan review/update coordination HEP prn    OT Home Exercise Plan Education provided:  Coordination HEP with focus on large amplitude, PWR! hands (basic 4)     Consulted and Agree with Plan of Care Patient           Patient will benefit from skilled therapeutic intervention in order to improve the following deficits and impairments:   Body Structure / Function / Physical Skills: ADL, Dexterity, IADL, Balance, Mobility, Coordination, FMC, Tone, UE functional use, Decreased knowledge of use of DME, GMC, Endurance, Improper spinal/pelvic alignment, Flexibility (bradykinesia)       Visit Diagnosis: Other symptoms and signs involving the nervous system  Other symptoms and signs involving the musculoskeletal system  Other lack of coordination  Tremor  Abnormal posture  Unsteadiness on feet    Problem List Patient Active Problem List   Diagnosis Date Noted  . Parkinson's disease ([place]) 04/13/2017  . BPH (benign prostatic hypertrophy) with urinary retention 09/30/2015    [facility] 05/06/2020, 3:08 PM  [facility] [address] [place], [place], [address] Phone: [phone]   Fax:  [phone]  Name: Zachary Wang. MRN: [id number] Date of Birth: 12-Jan-1941   Vianne Bulls, OTR/L [facility] [address] [place], [place]  [address] [phone] phone [phone] 05/06/20 3:08 PM

## 2020-05-09 ENCOUNTER — Encounter: Payer: No Typology Code available for payment source | Admitting: Occupational Therapy

## 2020-05-14 ENCOUNTER — Encounter: Payer: Self-pay | Admitting: Occupational Therapy

## 2020-05-14 ENCOUNTER — Ambulatory Visit: Payer: No Typology Code available for payment source | Admitting: Occupational Therapy

## 2020-05-14 ENCOUNTER — Other Ambulatory Visit: Payer: Self-pay

## 2020-05-14 DIAGNOSIS — R278 Other lack of coordination: Secondary | ICD-10-CM

## 2020-05-14 DIAGNOSIS — R29818 Other symptoms and signs involving the nervous system: Secondary | ICD-10-CM

## 2020-05-14 DIAGNOSIS — R2681 Unsteadiness on feet: Secondary | ICD-10-CM

## 2020-05-14 DIAGNOSIS — R293 Abnormal posture: Secondary | ICD-10-CM

## 2020-05-14 DIAGNOSIS — R251 Tremor, unspecified: Secondary | ICD-10-CM

## 2020-05-14 DIAGNOSIS — R29898 Other symptoms and signs involving the musculoskeletal system: Secondary | ICD-10-CM

## 2020-05-14 NOTE — Patient Instructions (Signed)
PWR! Hand Exercises:   Make each movement big and deliberate so that you feel the movement.  Then, start with elbows bent and hands closed:  PWR! Hands: Push hands out BIG. Elbows straight, wrists up, fingers open and spread apart BIG. (Can also perform by pushing down on table/chair/knees. Push above head, out to the side, behind you, in front of you.)  PWR! Step: Touch index finger to thumb while keeping other fingers straight. Flick fingers out BIG (thumb out/straighten fingers). Repeat with other fingers. (Step your thumb to each finger).   With arms stretched out in front of you (elbows straight), perform the following:  PWR! Rock:  Move wrists up and down [employer]! Twist: Twist palms up and down BIG  Perform at least 10 repetitions 1x/day, but perform PWR! Hands throughout the day when you are having trouble using your hands (picking up/manipulating small objects, writing, eating, typing, sewing, buttoning, etc.).   Coordination Exercises  Perform the following exercises for 20 minutes 1 times per day. Perform with both hand(s). Perform using big movements.  Pick different activities to perform each day.   Flipping Cards: Place deck of cards on the table. Flip cards over by opening your hand big to grasp and then turn your palm up big, opening hand fully to release.  Deal cards: Hold 1/2 or whole deck in your hand. Use thumb to push card off top of deck with one big push.  Rotate ball with fingertips: Pick up with fingers/thumb and move as much as you can with each turn/movement (clockwise and counter-clockwise).  Toss ball from one hand to the other: Toss big/high.  Deliberately open with toss and deliberately close hand after catch.  Toss ball in the air and catch with the same hand: Toss big/high.  Deliberately open with toss and deliberately close hand after catch.  Rotate 2 golf balls in your hand: Both directions.  Pick up coins and stack one at a time: Pick up  with big, intentional movements. Do not drag coin to the edge. (5-10 in a stack)  Pick up 5-10 coins one at a time and hold in palm. Then, move coins from palm to fingertips one at time and place in coin bank/container.  Practice writing: Slow down, write big, and focus on forming each letter.

## 2020-05-14 NOTE — Therapy (Signed)
[facility] [address] [place], [place], [address] Phone: [phone]   Fax:  [phone]  Occupational Therapy Treatment  Patient Details  Name: Zachary Wang. MRN: [id number] Date of Birth: 1940-12-26 Referring Provider (OT): Dr. Wells Guiles Tat   Encounter Date: 05/14/2020   OT End of Session - 05/14/20 0807    Visit Number 2    Number of Visits 9    Date for OT Re-Evaluation 06/05/20    Authorization Type VA approved 15 visits including eval through 08/02/20    Authorization Time Period --    Authorization - Visit Number 2    Authorization - Number of Visits 10    Progress Note Due on Visit 10    OT Start Time 0806    OT Stop Time 0845    OT Time Calculation (min) 39 min    Activity Tolerance Patient tolerated treatment well    Behavior During Therapy [facility] for tasks assessed/performed           Past Medical History:  Diagnosis Date  . Arthritis    hands  . BPH (benign prostatic hyperplasia)   . Elevated PSA   . Foley catheter in place   . History of adenomatous polyp of colon    2014  tubular adenoma  . History of malignant melanoma    s/p  excision left forearm and left axill lymph node bx 09-10-2004  . Hyperlipidemia   . Hypertension   . Peripheral vascular disease ([place])   . Urinary retention     Past Surgical History:  Procedure Laterality Date  . ANAL FISSURE REPAIR  1984  . COLONOSCOPY N/A 05/02/2013   Procedure: COLONOSCOPY;  Surgeon: Garlan Fair, MD;  Location: [facility];  Service: Endoscopy;  Laterality: N/A;  . INSERTION OF SUPRAPUBIC CATHETER N/A 09/30/2015   Procedure: INSERTION OF SUPRAPUBIC CATHETER;  Surgeon: Kathie Rhodes, MD;  Location: [facility];  Service: Urology;  Laterality: N/A;  . SHOULDER ARTHROSCOPY W/ SUBACROMIAL DECOMPRESSION AND DISTAL CLAVICLE EXCISION Left 05-20-2010   w/  Debridement , Bursectomy, Acromioplasty,  Capsulectomy  . TRANSURETHRAL  RESECTION OF PROSTATE N/A 09/30/2015   Procedure: TURP (TRANSURETHRAL RESECTION OF PROSTATE WITH GYRUS;  Surgeon: Kathie Rhodes, MD;  Location: [facility];  Service: Urology;  Laterality: N/A;  . WIDE EXCISION MALIGNANT MELANOMA LEFT FOREARM/  LEFT AXILLA LYMPH NODE BX  09-10-2004    There were no vitals filed for this visit.   Subjective Assessment - 05/14/20 0807    Subjective  nothing new    Pertinent History Parkinson's disease (03/2017 diagnosis).  PMH:  arthritis (limited L wrist flex due to arthritis), hx of malignant melanoma, HDL, HTN, Peripheral vascular disease, hx of L shoudler arthroscopy 2011, hx of bilateral cataract surgery    Patient Stated Goals improve ADLs,  improve buttoning    Currently in Pain? No/denies                                OT Education - 05/14/20 0827    Education Details Reviewed/updated PWR! hands (basic 4--decr L wrist flex for rock due to arthritis/pain) and coordination HEP with focus on large amplitude movements--see pt instructions    Person(s) Educated Patient    Methods Explanation;Demonstration;Verbal cues;Handout    Comprehension Verbalized understanding;Returned demonstration;Verbal cues required   mod cueing for incr movement amplitude  OT Short Term Goals - 05/06/20 1007      OT SHORT TERM GOAL #1   Title --      OT SHORT TERM GOAL #2   Title --             OT Long Term Goals - 05/06/20 1008      OT LONG TERM GOAL #1   Title Pt will verbalize understanding of adaptive strategies to incr ease/efficiency with ADLs/IADLs (including buttoning, picking up items from floor, opening containers, flipping eggs).--check LTGs 06/05/20    Time 4    Period Weeks    Status New      OT LONG TERM GOAL #2   Title Pt will improve LUE functional reaching/coordination for ADLs as shown by improving score on box and blocks test by at least 3 blocks.    Baseline R-53 blocks, L-47 blocks    Time 4      Period Weeks    Status New      OT LONG TERM GOAL #3   Title Pt will improve L hand coordination for ADLs as shown by completing 9-hole peg test in less than 36sec    Baseline 38.66sec    Time 4    Period Weeks    Status New      OT LONG TERM GOAL #4   Title Pt will improve bilateral hand coordination as shown by fastening/unfastening 3 buttons in 31sec or less.    Baseline 34.78sec    Time 4    Period Weeks    Status New      OT LONG TERM GOAL #5   Title Pt will be independent with updates to PD-specific HEP.    Time 4    Period Weeks    Status New                 Plan - 05/14/20 5277    Clinical Impression Statement Pt would benefit from reinforcement of coordination HEP due to needing mod cueing for incr movement amplitude.  However, pt does demo incr movement amplitude with cueing.    OT Occupational Profile and History Detailed Assessment- Review of Records and additional review of physical, cognitive, psychosocial history related to current functional performance    Occupational performance deficits (Please refer to evaluation for details): ADL's;IADL's;Leisure    Body Structure / Function / Physical Skills ADL;Dexterity;IADL;Balance;Mobility;Coordination;FMC;Tone;UE functional use;Decreased knowledge of use of DME;GMC;Endurance;Improper spinal/pelvic alignment;Flexibility   bradykinesia   Rehab Potential Good    Clinical Decision Making Several treatment options, min-mod task modification necessary    Comorbidities Affecting Occupational Performance: May have comorbidities impacting occupational performance    Modification or Assistance to Complete Evaluation  Min-Moderate modification of tasks or assist with assess necessary to complete eval    OT Frequency 2x / week    OT Duration 4 weeks   +eval or 9 visits   OT Treatment/Interventions Self-care/ADL training;Moist Heat;Fluidtherapy;DME and/or AE instruction;Therapeutic activities;Aquatic Therapy;Therapeutic  exercise;Functional Mobility Training;Neuromuscular education;Cryotherapy;Energy conservation;Manual Therapy;Patient/family education;Passive range of motion    Plan PWR! sitting review, review coordination HEP    OT Home Exercise Plan Education provided:  Coordination HEP with focus on large amplitude, PWR! hands (basic 4)    Consulted and Agree with Plan of Care Patient           Patient will benefit from skilled therapeutic intervention in order to improve the following deficits and impairments:   Body Structure / Function / Physical Skills: ADL, Dexterity, IADL, Balance, Mobility,  Coordination, FMC, Tone, UE functional use, Decreased knowledge of use of DME, GMC, Endurance, Improper spinal/pelvic alignment, Flexibility (bradykinesia)       Visit Diagnosis: Other symptoms and signs involving the nervous system  Other symptoms and signs involving the musculoskeletal system  Other lack of coordination  Tremor  Abnormal posture  Unsteadiness on feet    Problem List Patient Active Problem List   Diagnosis Date Noted  . Parkinson's disease ([place]) 04/13/2017  . BPH (benign prostatic hypertrophy) with urinary retention 09/30/2015    [facility] 05/14/2020, 3:24 PM  [facility] [address] [place], [place], [address] Phone: [phone]   Fax:  [phone]  Name: Zachary Wang. MRN: [id number] Date of Birth: 1941-05-29   Vianne Bulls, OTR/L [facility] [address] [place], [place]  [address] [phone] phone [phone] 05/14/20 3:24 PM

## 2020-05-16 ENCOUNTER — Other Ambulatory Visit: Payer: Self-pay

## 2020-05-16 ENCOUNTER — Encounter: Payer: Self-pay | Admitting: Occupational Therapy

## 2020-05-16 ENCOUNTER — Ambulatory Visit: Payer: No Typology Code available for payment source | Admitting: Occupational Therapy

## 2020-05-16 DIAGNOSIS — R29818 Other symptoms and signs involving the nervous system: Secondary | ICD-10-CM | POA: Diagnosis not present

## 2020-05-16 DIAGNOSIS — R29898 Other symptoms and signs involving the musculoskeletal system: Secondary | ICD-10-CM

## 2020-05-16 DIAGNOSIS — R2689 Other abnormalities of gait and mobility: Secondary | ICD-10-CM

## 2020-05-16 DIAGNOSIS — R278 Other lack of coordination: Secondary | ICD-10-CM

## 2020-05-16 DIAGNOSIS — R293 Abnormal posture: Secondary | ICD-10-CM

## 2020-05-16 DIAGNOSIS — R2681 Unsteadiness on feet: Secondary | ICD-10-CM

## 2020-05-16 DIAGNOSIS — R251 Tremor, unspecified: Secondary | ICD-10-CM

## 2020-05-16 NOTE — Therapy (Signed)
[facility] [address] [place], [place], [address] Phone: [phone]   Fax:  [phone]  Occupational Therapy Treatment  Patient Details  Name: Zachary Wang. MRN: [id number] Date of Birth: 1941-06-14 Referring Provider (OT): Dr. Wells Guiles Tat   Encounter Date: 05/16/2020   OT End of Session - 05/16/20 0801    Visit Number 3    Number of Visits 9    Date for OT Re-Evaluation 06/05/20    Authorization Type VA approved 15 visits including eval through 08/02/20    Authorization Time Period --    Authorization - Visit Number 3    Authorization - Number of Visits 10    Progress Note Due on Visit 10    OT Start Time 0802    OT Stop Time 0845    OT Time Calculation (min) 43 min    Activity Tolerance Patient tolerated treatment well    Behavior During Therapy [facility] for tasks assessed/performed           Past Medical History:  Diagnosis Date  . Arthritis    hands  . BPH (benign prostatic hyperplasia)   . Elevated PSA   . Foley catheter in place   . History of adenomatous polyp of colon    2014  tubular adenoma  . History of malignant melanoma    s/p  excision left forearm and left axill lymph node bx 09-10-2004  . Hyperlipidemia   . Hypertension   . Peripheral vascular disease ([place])   . Urinary retention     Past Surgical History:  Procedure Laterality Date  . ANAL FISSURE REPAIR  1984  . COLONOSCOPY N/A 05/02/2013   Procedure: COLONOSCOPY;  Surgeon: Garlan Fair, MD;  Location: [facility];  Service: Endoscopy;  Laterality: N/A;  . INSERTION OF SUPRAPUBIC CATHETER N/A 09/30/2015   Procedure: INSERTION OF SUPRAPUBIC CATHETER;  Surgeon: Kathie Rhodes, MD;  Location: [facility];  Service: Urology;  Laterality: N/A;  . SHOULDER ARTHROSCOPY W/ SUBACROMIAL DECOMPRESSION AND DISTAL CLAVICLE EXCISION Left 05-20-2010   w/  Debridement , Bursectomy, Acromioplasty,  Capsulectomy  . TRANSURETHRAL  RESECTION OF PROSTATE N/A 09/30/2015   Procedure: TURP (TRANSURETHRAL RESECTION OF PROSTATE WITH GYRUS;  Surgeon: Kathie Rhodes, MD;  Location: [facility];  Service: Urology;  Laterality: N/A;  . WIDE EXCISION MALIGNANT MELANOMA LEFT FOREARM/  LEFT AXILLA LYMPH NODE BX  09-10-2004    There were no vitals filed for this visit.   Subjective Assessment - 05/16/20 0800    Subjective  nothing new    Pertinent History Parkinson's disease (03/2017 diagnosis).  PMH:  arthritis (limited L wrist flex due to arthritis), hx of malignant melanoma, HDL, HTN, Peripheral vascular disease, hx of L shoudler arthroscopy 2011, hx of bilateral cataract surgery    Patient Stated Goals improve ADLs,  improve buttoning    Currently in Pain? No/denies           Reviewed PWR! Moves in sitting (basic 4) x20 each with min cueing for incr movement amplitude.  Educated pt on use of PWR! Step and for getting in/out of car with lateral wt. Shift.  Pt verbalized understanding.  Adaptive strategies/use of large amplitude movements for Donning shirt (big movements, posture), and donning pants (sitting, big movement, lateral weight shift, gathering material/big hand, start with pants out straight.     Simulated ADLs with bag with focus/min cues for large amplitude movements:  Donning/doffing pull-over shirt, donning/doffing pants, pulling bag  into hand for clothing adjustment/donning socks.  Practiced cleaning hands with use of big movements, recommended/instructed pt to initiate movement with LUE as pt demo bradykinesia with LUE (can also do this with washing hair).  Reviewed activities from coordination HEP with each hand with min cueing for incr movement amplitude:  Flipping cards, dealing cards with thumb, Picking up and stacking coins, manipulating/translating coins in each hand to place in coin bank on at a time.  Pt demo incr difficulty dealing cards and manipulating cards in L hand (mod  difficulty).          OT Short Term Goals - 05/06/20 1007      OT SHORT TERM GOAL #1   Title --      OT SHORT TERM GOAL #2   Title --             OT Long Term Goals - 05/06/20 1008      OT LONG TERM GOAL #1   Title Pt will verbalize understanding of adaptive strategies to incr ease/efficiency with ADLs/IADLs (including buttoning, picking up items from floor, opening containers, flipping eggs).--check LTGs 06/05/20    Time 4    Period Weeks    Status New      OT LONG TERM GOAL #2   Title Pt will improve LUE functional reaching/coordination for ADLs as shown by improving score on box and blocks test by at least 3 blocks.    Baseline R-53 blocks, L-47 blocks    Time 4    Period Weeks    Status New      OT LONG TERM GOAL #3   Title Pt will improve L hand coordination for ADLs as shown by completing 9-hole peg test in less than 36sec    Baseline 38.66sec    Time 4    Period Weeks    Status New      OT LONG TERM GOAL #4   Title Pt will improve bilateral hand coordination as shown by fastening/unfastening 3 buttons in 31sec or less.    Baseline 34.78sec    Time 4    Period Weeks    Status New      OT LONG TERM GOAL #5   Title Pt will be independent with updates to PD-specific HEP.    Time 4    Period Weeks    Status New                 Plan - 05/16/20 0801    Clinical Impression Statement Pt would benefit from reinforcement of coordination HEP due to needing mod cueing for incr movement amplitude.  However, pt does demo incr movement amplitude with cueing.    OT Occupational Profile and History Detailed Assessment- Review of Records and additional review of physical, cognitive, psychosocial history related to current functional performance    Occupational performance deficits (Please refer to evaluation for details): ADL's;IADL's;Leisure    Body Structure / Function / Physical Skills ADL;Dexterity;IADL;Balance;Mobility;Coordination;FMC;Tone;UE functional  use;Decreased knowledge of use of DME;GMC;Endurance;Improper spinal/pelvic alignment;Flexibility   bradykinesia   Rehab Potential Good    Clinical Decision Making Several treatment options, min-mod task modification necessary    Comorbidities Affecting Occupational Performance: May have comorbidities impacting occupational performance    Modification or Assistance to Complete Evaluation  Min-Moderate modification of tasks or assist with assess necessary to complete eval    OT Frequency 2x / week    OT Duration 4 weeks   +eval or 9 visits   OT Treatment/Interventions  Self-care/ADL training;Moist Heat;Fluidtherapy;DME and/or AE instruction;Therapeutic activities;Aquatic Therapy;Therapeutic exercise;Functional Mobility Training;Neuromuscular education;Cryotherapy;Energy conservation;Manual Therapy;Patient/family education;Passive range of motion    Plan PWR! sitting review, review coordination HEP    OT Home Exercise Plan Education provided:  Coordination HEP with focus on large amplitude, PWR! hands (basic 4)    Consulted and Agree with Plan of Care Patient           Patient will benefit from skilled therapeutic intervention in order to improve the following deficits and impairments:   Body Structure / Function / Physical Skills: ADL, Dexterity, IADL, Balance, Mobility, Coordination, FMC, Tone, UE functional use, Decreased knowledge of use of DME, GMC, Endurance, Improper spinal/pelvic alignment, Flexibility (bradykinesia)       Visit Diagnosis: Other symptoms and signs involving the nervous system  Other symptoms and signs involving the musculoskeletal system  Other lack of coordination  Tremor  Abnormal posture  Unsteadiness on feet  Other abnormalities of gait and mobility    Problem List Patient Active Problem List   Diagnosis Date Noted  . Parkinson's disease ([place]) 04/13/2017  . BPH (benign prostatic hypertrophy) with urinary retention 09/30/2015     [facility] 05/16/2020, 8:01 AM  [facility] [address] [place], [place], [address] Phone: [phone]   Fax:  [phone]  Name: Prudencio Velazco. MRN: [id number] Date of Birth: [birth year]   Vianne Bulls, OTR/L [facility] [address] [place], [place]  [address] [phone] phone [phone] 05/16/20 8:02 AM

## 2020-05-17 ENCOUNTER — Ambulatory Visit (INDEPENDENT_AMBULATORY_CARE_PROVIDER_SITE_OTHER): Payer: No Typology Code available for payment source | Admitting: Neurology

## 2020-05-17 DIAGNOSIS — K117 Disturbances of salivary secretion: Secondary | ICD-10-CM

## 2020-05-17 MED ORDER — RIMABOTULINUMTOXINB 5000 UNIT/ML IM SOLN
5000.0000 [IU] | Freq: Once | INTRAMUSCULAR | Status: AC
  Administered 2020-05-17: 5000 [IU] via INTRAMUSCULAR

## 2020-05-17 NOTE — Procedures (Deleted)
Bot[facility]um [facility]    History:  Diagnosis: Sialorrhea    Result History  Working well.    Consent obtained from: The patient The patient was educated on the botulinum toxin the black blox warning and given a copy of the botox patient medication guide.  The patient understands that this warning states that there have been reported cases of the Botox extending beyond the injection site and creating adverse effects, similar to those of botulism. This included loss of strength, trouble walking, hoarseness, trouble saying words clearly, loss of bladder control, trouble breathing, trouble swallowing, diplopia, blurry vision and ptosis. Most of the distant spread of Botox was happening in patients, primarily children, who received medication for spasticity or for cervical dystonia. The patient expressed understanding and desire to proceed.     Injections  Location Left  Right Units Number of sites  Submandibular gland 250 250 500 1 per side  Parotid 2250 2250 2500 1 per side  TOTAL UNITS:     5000      Type of Toxin: Myobloc type B As ordered and injected IM at today's visit Total Units: 5000  Discarded Units: 0  Needle drawback with each injection was free of blood. Pt tolerated procedure well without complications.   Reinjection is anticipated in 3 months.                 

## 2020-05-17 NOTE — Procedures (Signed)
[facility]    History:  Diagnosis: Sialorrhea    Result History  Working well.    Consent obtained from: The patient The patient was educated on the botulinum toxin the black blox warning and given a copy of the botox patient medication guide.  The patient understands that this warning states that there have been reported cases of the Botox extending beyond the injection site and creating adverse effects, similar to those of botulism. This included loss of strength, trouble walking, hoarseness, trouble saying words clearly, loss of bladder control, trouble breathing, trouble swallowing, diplopia, blurry vision and ptosis. Most of the distant spread of Botox was happening in patients, primarily children, who received medication for spasticity or for cervical dystonia. The patient expressed understanding and desire to proceed.     Injections  Location Left  Right Units Number of sites  Submandibular gland 250 250 500 1 per side  Parotid 2250 2250 2500 1 per side  TOTAL UNITS:     5000      Type of Toxin: Myobloc type B As ordered and injected IM at today's visit Total Units: 5000  Discarded Units: 0  Needle drawback with each injection was free of blood. Pt tolerated procedure well without complications.   Reinjection is anticipated in 3 months.                 

## 2020-05-21 ENCOUNTER — Encounter: Payer: No Typology Code available for payment source | Admitting: Occupational Therapy

## 2020-05-23 ENCOUNTER — Ambulatory Visit: Payer: No Typology Code available for payment source | Admitting: Occupational Therapy

## 2020-05-23 ENCOUNTER — Encounter: Payer: No Typology Code available for payment source | Admitting: Speech Pathology

## 2020-06-03 ENCOUNTER — Other Ambulatory Visit: Payer: Self-pay

## 2020-06-03 ENCOUNTER — Ambulatory Visit: Payer: No Typology Code available for payment source | Attending: Internal Medicine | Admitting: Occupational Therapy

## 2020-06-03 DIAGNOSIS — R29898 Other symptoms and signs involving the musculoskeletal system: Secondary | ICD-10-CM | POA: Insufficient documentation

## 2020-06-03 DIAGNOSIS — R251 Tremor, unspecified: Secondary | ICD-10-CM | POA: Insufficient documentation

## 2020-06-03 DIAGNOSIS — R293 Abnormal posture: Secondary | ICD-10-CM | POA: Diagnosis present

## 2020-06-03 DIAGNOSIS — R2681 Unsteadiness on feet: Secondary | ICD-10-CM | POA: Diagnosis present

## 2020-06-03 DIAGNOSIS — R2689 Other abnormalities of gait and mobility: Secondary | ICD-10-CM | POA: Diagnosis present

## 2020-06-03 DIAGNOSIS — R278 Other lack of coordination: Secondary | ICD-10-CM | POA: Insufficient documentation

## 2020-06-03 DIAGNOSIS — R29818 Other symptoms and signs involving the nervous system: Secondary | ICD-10-CM | POA: Diagnosis not present

## 2020-06-03 NOTE — Therapy (Signed)
[facility] [address] [place], [place], [address] Phone: [phone]   Fax:  [phone]  Occupational Therapy Treatment  Patient Details  Name: Zachary Wang. MRN: [id number] Date of Birth: 21-Aug-1940 Referring Provider (OT): Dr. Wells Guiles Tat   Encounter Date: 06/03/2020   OT End of Session - 06/03/20 0831    Visit Number 4    Number of Visits 9    Date for OT Re-Evaluation 06/05/20    Authorization Type VA approved 15 visits including eval through 08/02/20    Authorization Time Period --    Authorization - Visit Number 4    Authorization - Number of Visits 10    Progress Note Due on Visit 10    OT Start Time 0807    OT Stop Time 0845    OT Time Calculation (min) 38 min    Activity Tolerance Patient tolerated treatment well    Behavior During Therapy [facility] for tasks assessed/performed           Past Medical History:  Diagnosis Date  . Arthritis    hands  . BPH (benign prostatic hyperplasia)   . Elevated PSA   . Foley catheter in place   . History of adenomatous polyp of colon    2014  tubular adenoma  . History of malignant melanoma    s/p  excision left forearm and left axill lymph node bx 09-10-2004  . Hyperlipidemia   . Hypertension   . Peripheral vascular disease ([place])   . Urinary retention     Past Surgical History:  Procedure Laterality Date  . ANAL FISSURE REPAIR  1984  . COLONOSCOPY N/A 05/02/2013   Procedure: COLONOSCOPY;  Surgeon: Garlan Fair, MD;  Location: [facility];  Service: Endoscopy;  Laterality: N/A;  . INSERTION OF SUPRAPUBIC CATHETER N/A 09/30/2015   Procedure: INSERTION OF SUPRAPUBIC CATHETER;  Surgeon: Kathie Rhodes, MD;  Location: [facility];  Service: Urology;  Laterality: N/A;  . SHOULDER ARTHROSCOPY W/ SUBACROMIAL DECOMPRESSION AND DISTAL CLAVICLE EXCISION Left 05-20-2010   w/  Debridement , Bursectomy, Acromioplasty,  Capsulectomy  . TRANSURETHRAL  RESECTION OF PROSTATE N/A 09/30/2015   Procedure: TURP (TRANSURETHRAL RESECTION OF PROSTATE WITH GYRUS;  Surgeon: Kathie Rhodes, MD;  Location: [facility];  Service: Urology;  Laterality: N/A;  . WIDE EXCISION MALIGNANT MELANOMA LEFT FOREARM/  LEFT AXILLA LYMPH NODE BX  09-10-2004    There were no vitals filed for this visit.   Subjective Assessment - 06/03/20 0809    Subjective  Pt reports that gettinbg in car is easier    Pertinent History Parkinson's disease (03/2017 diagnosis).  PMH:  arthritis (limited L wrist flex due to arthritis), hx of malignant melanoma, HDL, HTN, Peripheral vascular disease, hx of L shoudler arthroscopy 2011, hx of bilateral cataract surgery    Patient Stated Goals improve ADLs,  improve buttoning    Currently in Pain? No/denies            Coordination activities with focus on large amplitude movements including:  Flipping cards, dealing cards with thumb, sliding cards across table with each hand with min cueing for all.  Practiced buttoning with min cueing for use of PWR! Hands/ large amplitude movements after review of strategy--improvement noted.     Standing, functional reaching with each UE, laterally, across body and overhead with use of large amplitude movements incorporating trunk rotation, PWR! Hands, and wt. Shift with min cueing for large amplitude.  OT Education - 06/03/20 0258    Education Details Reviewed PWR! moves in modified quadruped with min cueing for large amplitude movements (head position) and recommended folded towel under L wrist due to decr wrist ext    Person(s) Educated Patient    Methods Explanation;Demonstration   (has handout from previous)   Comprehension Verbalized understanding;Returned demonstration;Verbal cues required            OT Short Term Goals - 05/06/20 1007      OT SHORT TERM GOAL #1   Title --      OT SHORT TERM GOAL #2   Title --             OT Long Term Goals - 05/06/20 1008       OT LONG TERM GOAL #1   Title Pt will verbalize understanding of adaptive strategies to incr ease/efficiency with ADLs/IADLs (including buttoning, picking up items from floor, opening containers, flipping eggs).--check LTGs 06/05/20    Time 4    Period Weeks    Status New      OT LONG TERM GOAL #2   Title Pt will improve LUE functional reaching/coordination for ADLs as shown by improving score on box and blocks test by at least 3 blocks.    Baseline R-53 blocks, L-47 blocks    Time 4    Period Weeks    Status New      OT LONG TERM GOAL #3   Title Pt will improve L hand coordination for ADLs as shown by completing 9-hole peg test in less than 36sec    Baseline 38.66sec    Time 4    Period Weeks    Status New      OT LONG TERM GOAL #4   Title Pt will improve bilateral hand coordination as shown by fastening/unfastening 3 buttons in 31sec or less.    Baseline 34.78sec    Time 4    Period Weeks    Status New      OT LONG TERM GOAL #5   Title Pt will be independent with updates to PD-specific HEP.    Time 4    Period Weeks    Status New                 Plan - 06/03/20 5277    Clinical Impression Statement Pt demo improved movement amplitude for buttoning and coordination tasks, particularly with cueing.    OT Occupational Profile and History Detailed Assessment- Review of Records and additional review of physical, cognitive, psychosocial history related to current functional performance    Occupational performance deficits (Please refer to evaluation for details): ADL's;IADL's;Leisure    Body Structure / Function / Physical Skills ADL;Dexterity;IADL;Balance;Mobility;Coordination;FMC;Tone;UE functional use;Decreased knowledge of use of DME;GMC;Endurance;Improper spinal/pelvic alignment;Flexibility   bradykinesia   Rehab Potential Good    Clinical Decision Making Several treatment options, min-mod task modification necessary    Comorbidities Affecting Occupational  Performance: May have comorbidities impacting occupational performance    Modification or Assistance to Complete Evaluation  Min-Moderate modification of tasks or assist with assess necessary to complete eval    OT Frequency 2x / week    OT Duration 4 weeks   +eval or 9 visits   OT Treatment/Interventions Self-care/ADL training;Moist Heat;Fluidtherapy;DME and/or AE instruction;Therapeutic activities;Aquatic Therapy;Therapeutic exercise;Functional Mobility Training;Neuromuscular education;Cryotherapy;Energy conservation;Manual Therapy;Patient/family education;Passive range of motion    Plan functional reaching, coordination, ADL strategies (picking up items from floor, flipping eggs, opening containers    OT Home Exercise  Plan Education provided:  Coordination HEP with focus on large amplitude, PWR! hands (basic 4)    Consulted and Agree with Plan of Care Patient           Patient will benefit from skilled therapeutic intervention in order to improve the following deficits and impairments:   Body Structure / Function / Physical Skills: ADL, Dexterity, IADL, Balance, Mobility, Coordination, FMC, Tone, UE functional use, Decreased knowledge of use of DME, GMC, Endurance, Improper spinal/pelvic alignment, Flexibility (bradykinesia)       Visit Diagnosis: Other symptoms and signs involving the nervous system  Other symptoms and signs involving the musculoskeletal system  Other lack of coordination  Tremor  Abnormal posture  Unsteadiness on feet  Other abnormalities of gait and mobility    Problem List Patient Active Problem List   Diagnosis Date Noted  . Parkinson's disease ([place]) 04/13/2017  . BPH (benign prostatic hypertrophy) with urinary retention 09/30/2015    [facility] 06/03/2020, 6:22 PM  [facility] [address] [place], [place], [address] Phone: [phone]   Fax:  [phone]  Name: Zachary Wang. MRN: [id number] Date of Birth: 18-Jun-1941   Vianne Bulls, OTR/L [facility] [address] [place], [place]  [address] [phone] phone [phone] 06/03/20 6:23 PM

## 2020-06-06 ENCOUNTER — Ambulatory Visit: Payer: No Typology Code available for payment source | Admitting: Occupational Therapy

## 2020-06-06 ENCOUNTER — Encounter: Payer: Self-pay | Admitting: Occupational Therapy

## 2020-06-06 ENCOUNTER — Other Ambulatory Visit: Payer: Self-pay

## 2020-06-06 DIAGNOSIS — R2689 Other abnormalities of gait and mobility: Secondary | ICD-10-CM

## 2020-06-06 DIAGNOSIS — R293 Abnormal posture: Secondary | ICD-10-CM

## 2020-06-06 DIAGNOSIS — R29818 Other symptoms and signs involving the nervous system: Secondary | ICD-10-CM | POA: Diagnosis not present

## 2020-06-06 DIAGNOSIS — R2681 Unsteadiness on feet: Secondary | ICD-10-CM

## 2020-06-06 DIAGNOSIS — R29898 Other symptoms and signs involving the musculoskeletal system: Secondary | ICD-10-CM

## 2020-06-06 DIAGNOSIS — R278 Other lack of coordination: Secondary | ICD-10-CM

## 2020-06-06 NOTE — Therapy (Signed)
[facility] [address] [place], [place], [address] Phone: [phone]   Fax:  [phone]  Occupational Therapy Treatment  Patient Details  Name: Zachary Wang. MRN: [id number] Date of Birth: Mar 14, 1941 Referring Provider (OT): Dr. Wells Guiles Tat   Encounter Date: 06/06/2020   OT End of Session - 06/06/20 0809    Visit Number 5    Number of Visits 9    Date for OT Re-Evaluation 06/05/20    Authorization Type VA approved 15 visits including eval through 08/02/20    Authorization Time Period --    Authorization - Visit Number 4    Authorization - Number of Visits 10    Progress Note Due on Visit 10    OT Start Time 0803    OT Stop Time 0845    OT Time Calculation (min) 42 min    Activity Tolerance Patient tolerated treatment well    Behavior During Therapy [facility] for tasks assessed/performed           Past Medical History:  Diagnosis Date  . Arthritis    hands  . BPH (benign prostatic hyperplasia)   . Elevated PSA   . Foley catheter in place   . History of adenomatous polyp of colon    2014  tubular adenoma  . History of malignant melanoma    s/p  excision left forearm and left axill lymph node bx 09-10-2004  . Hyperlipidemia   . Hypertension   . Peripheral vascular disease ([place])   . Urinary retention     Past Surgical History:  Procedure Laterality Date  . ANAL FISSURE REPAIR  1984  . COLONOSCOPY N/A 05/02/2013   Procedure: COLONOSCOPY;  Surgeon: Garlan Fair, MD;  Location: [facility];  Service: Endoscopy;  Laterality: N/A;  . INSERTION OF SUPRAPUBIC CATHETER N/A 09/30/2015   Procedure: INSERTION OF SUPRAPUBIC CATHETER;  Surgeon: Kathie Rhodes, MD;  Location: [facility];  Service: Urology;  Laterality: N/A;  . SHOULDER ARTHROSCOPY W/ SUBACROMIAL DECOMPRESSION AND DISTAL CLAVICLE EXCISION Left 05-20-2010   w/  Debridement , Bursectomy, Acromioplasty,  Capsulectomy  . TRANSURETHRAL  RESECTION OF PROSTATE N/A 09/30/2015   Procedure: TURP (TRANSURETHRAL RESECTION OF PROSTATE WITH GYRUS;  Surgeon: Kathie Rhodes, MD;  Location: [facility];  Service: Urology;  Laterality: N/A;  . WIDE EXCISION MALIGNANT MELANOMA LEFT FOREARM/  LEFT AXILLA LYMPH NODE BX  09-10-2004    There were no vitals filed for this visit.   Subjective Assessment - 06/06/20 0808    Subjective  Pt reports that putting LLE in first helps some    Pertinent History Parkinson's disease (03/2017 diagnosis).  PMH:  arthritis (limited L wrist flex due to arthritis), hx of malignant melanoma, HDL, HTN, Peripheral vascular disease, hx of L shoudler arthroscopy 2011, hx of bilateral cataract surgery    Patient Stated Goals improve ADLs,  improve buttoning    Currently in Pain? No/denies           Pt instructed in adaptive/large amplitude movement strategies for ADLs including:  Opening containers and packages (use of PWR! Hands, large amplitude movements, use of shelf liner)--pt returned demo with practice, practiced simulated flipping "egg" with spatula with min cueing for technique and recommended tilting pan with L hand to assist, picking things up from floor (coins, clothespins) with each hand with min cueing for large amplitude movement strategies and pt returned demo.   Functional reaching to place small pegs in vertical pegboard to  copy design with each UE with min-mod difficulty due to rigidity, decr coordination, and fatigue with LUE, min difficulty with RUE (set-up and min cueing for large amplitude movements).  Placing grooved pegs in pegboard with each UE with min cueing for use of PWR! Hands and incr difficulty/cueing for LUE in-hand manipulation.   Due to significant fatigue and incr difficulty reaching with LUE, recommended pt focus on PWR! Moves throughout the day, ensure that he is reaching fully with elbow extended with LUE and incr use of LUE for activities that require less  coordination (including reaching in cabinet, closet, wiping table, opening fridge, turning on faucet/lightswitch) with nondominant LUE to help with decr rigidity.  Pt verbalized understanding.      OT Short Term Goals - 05/06/20 1007      OT SHORT TERM GOAL #1   Title --      OT SHORT TERM GOAL #2   Title --             OT Long Term Goals - 05/06/20 1008      OT LONG TERM GOAL #1   Title Pt will verbalize understanding of adaptive strategies to incr ease/efficiency with ADLs/IADLs (including buttoning, picking up items from floor, opening containers, flipping eggs).--check LTGs 06/05/20    Time 4    Period Weeks    Status New      OT LONG TERM GOAL #2   Title Pt will improve LUE functional reaching/coordination for ADLs as shown by improving score on box and blocks test by at least 3 blocks.    Baseline R-53 blocks, L-47 blocks    Time 4    Period Weeks    Status New      OT LONG TERM GOAL #3   Title Pt will improve L hand coordination for ADLs as shown by completing 9-hole peg test in less than 36sec    Baseline 38.66sec    Time 4    Period Weeks    Status New      OT LONG TERM GOAL #4   Title Pt will improve bilateral hand coordination as shown by fastening/unfastening 3 buttons in 31sec or less.    Baseline 34.78sec    Time 4    Period Weeks    Status New      OT LONG TERM GOAL #5   Title Pt will be independent with updates to PD-specific HEP.    Time 4    Period Weeks    Status New                 Plan - 06/06/20 0737    Clinical Impression Statement Pt demo improved movement amplitude for buttoning and coordination tasks, particularly with cueing.    OT Occupational Profile and History Detailed Assessment- Review of Records and additional review of physical, cognitive, psychosocial history related to current functional performance    Occupational performance deficits (Please refer to evaluation for details): ADL's;IADL's;Leisure    Body Structure  / Function / Physical Skills ADL;Dexterity;IADL;Balance;Mobility;Coordination;FMC;Tone;UE functional use;Decreased knowledge of use of DME;GMC;Endurance;Improper spinal/pelvic alignment;Flexibility   bradykinesia   Rehab Potential Good    Clinical Decision Making Several treatment options, min-mod task modification necessary    Comorbidities Affecting Occupational Performance: May have comorbidities impacting occupational performance    Modification or Assistance to Complete Evaluation  Min-Moderate modification of tasks or assist with assess necessary to complete eval    OT Frequency 2x / week    OT Duration 4 weeks   +  eval or 9 visits   OT Treatment/Interventions Self-care/ADL training;Moist Heat;Fluidtherapy;DME and/or AE instruction;Therapeutic activities;Aquatic Therapy;Therapeutic exercise;Functional Mobility Training;Neuromuscular education;Cryotherapy;Energy conservation;Manual Therapy;Patient/family education;Passive range of motion    Plan Review ADL strategies prn, bilateral hand coordination, Check goals and anticipate d/c next visit.    OT Home Exercise Plan Education provided:  Coordination HEP with focus on large amplitude, PWR! hands (basic 4)    Consulted and Agree with Plan of Care Patient           Patient will benefit from skilled therapeutic intervention in order to improve the following deficits and impairments:   Body Structure / Function / Physical Skills: ADL, Dexterity, IADL, Balance, Mobility, Coordination, FMC, Tone, UE functional use, Decreased knowledge of use of DME, GMC, Endurance, Improper spinal/pelvic alignment, Flexibility (bradykinesia)       Visit Diagnosis: Other symptoms and signs involving the nervous system  Other symptoms and signs involving the musculoskeletal system  Other lack of coordination  Abnormal posture  Unsteadiness on feet  Other abnormalities of gait and mobility    Problem List Patient Active Problem List   Diagnosis Date  Noted  . Parkinson's disease ([place]) 04/13/2017  . BPH (benign prostatic hypertrophy) with urinary retention 09/30/2015    [facility] 06/06/2020, 8:40 AM  [facility] [address] [place], [place], [address] Phone: [phone]   Fax:  [phone]  Name: Zachary Wang. MRN: [id number] Date of Birth: 1941/02/09   Vianne Bulls, OTR/L [facility] [address] [place], [place]  [address] [phone] phone [phone] 06/06/20 8:44 AM

## 2020-06-18 ENCOUNTER — Encounter: Payer: Self-pay | Admitting: Occupational Therapy

## 2020-06-18 ENCOUNTER — Other Ambulatory Visit: Payer: Self-pay

## 2020-06-18 ENCOUNTER — Ambulatory Visit: Payer: No Typology Code available for payment source | Attending: Internal Medicine | Admitting: Occupational Therapy

## 2020-06-18 DIAGNOSIS — R293 Abnormal posture: Secondary | ICD-10-CM | POA: Diagnosis present

## 2020-06-18 DIAGNOSIS — R2681 Unsteadiness on feet: Secondary | ICD-10-CM | POA: Insufficient documentation

## 2020-06-18 DIAGNOSIS — R29898 Other symptoms and signs involving the musculoskeletal system: Secondary | ICD-10-CM | POA: Diagnosis present

## 2020-06-18 DIAGNOSIS — R251 Tremor, unspecified: Secondary | ICD-10-CM

## 2020-06-18 DIAGNOSIS — R2689 Other abnormalities of gait and mobility: Secondary | ICD-10-CM | POA: Insufficient documentation

## 2020-06-18 DIAGNOSIS — R278 Other lack of coordination: Secondary | ICD-10-CM | POA: Diagnosis present

## 2020-06-18 DIAGNOSIS — R29818 Other symptoms and signs involving the nervous system: Secondary | ICD-10-CM

## 2020-06-18 DIAGNOSIS — M25612 Stiffness of left shoulder, not elsewhere classified: Secondary | ICD-10-CM | POA: Diagnosis present

## 2020-06-18 DIAGNOSIS — M25611 Stiffness of right shoulder, not elsewhere classified: Secondary | ICD-10-CM | POA: Diagnosis present

## 2020-06-18 NOTE — Therapy (Signed)
[facility] [address] [place], [place], [address] Phone: [phone]   Fax:  [phone]  Occupational Therapy Treatment  Patient Details  Name: Zachary Wang. MRN: [id number] Date of Birth: 1941-07-08 Referring Provider (OT): Dr. Wells Guiles Tat   Encounter Date: 06/18/2020   OT End of Session - 06/18/20 0852    Visit Number 6    Number of Visits 9    Date for OT Re-Evaluation 06/05/20    Authorization Type VA approved 15 visits including eval through 08/02/20    Authorization Time Period --    Authorization - Visit Number 5    Authorization - Number of Visits 10    Progress Note Due on Visit 10    OT Start Time 0850    OT Stop Time 0930    OT Time Calculation (min) 40 min    Activity Tolerance Patient tolerated treatment well    Behavior During Therapy [facility] for tasks assessed/performed           Past Medical History:  Diagnosis Date  . Arthritis    hands  . BPH (benign prostatic hyperplasia)   . Elevated PSA   . Foley catheter in place   . History of adenomatous polyp of colon    2014  tubular adenoma  . History of malignant melanoma    s/p  excision left forearm and left axill lymph node bx 09-10-2004  . Hyperlipidemia   . Hypertension   . Peripheral vascular disease ([place])   . Urinary retention     Past Surgical History:  Procedure Laterality Date  . ANAL FISSURE REPAIR  1984  . COLONOSCOPY N/A 05/02/2013   Procedure: COLONOSCOPY;  Surgeon: Garlan Fair, MD;  Location: [facility];  Service: Endoscopy;  Laterality: N/A;  . INSERTION OF SUPRAPUBIC CATHETER N/A 09/30/2015   Procedure: INSERTION OF SUPRAPUBIC CATHETER;  Surgeon: Kathie Rhodes, MD;  Location: [facility];  Service: Urology;  Laterality: N/A;  . SHOULDER ARTHROSCOPY W/ SUBACROMIAL DECOMPRESSION AND DISTAL CLAVICLE EXCISION Left 05-20-2010   w/  Debridement , Bursectomy, Acromioplasty,  Capsulectomy  . TRANSURETHRAL  RESECTION OF PROSTATE N/A 09/30/2015   Procedure: TURP (TRANSURETHRAL RESECTION OF PROSTATE WITH GYRUS;  Surgeon: Kathie Rhodes, MD;  Location: [facility];  Service: Urology;  Laterality: N/A;  . WIDE EXCISION MALIGNANT MELANOMA LEFT FOREARM/  LEFT AXILLA LYMPH NODE BX  09-10-2004    There were no vitals filed for this visit.   Subjective Assessment - 06/18/20 0852    Subjective  Stretching really helps    Pertinent History Parkinson's disease (03/2017 diagnosis).  PMH:  arthritis (limited L wrist flex due to arthritis), hx of malignant melanoma, HDL, HTN, Peripheral vascular disease, hx of L shoudler arthroscopy 2011, hx of bilateral cataract surgery    Patient Stated Goals improve ADLs,  improve buttoning    Currently in Pain? No/denies             Pt instructed in eating strategies due to reports of difficulty eating salad.  Recommended pt holding utensil in middle of handle, hold "like a pen" (vs. Lateral grasp) and perpendicular stabbing of lettuce.  Also discussed cutting salad into smaller pieces. Reviewed strategies (verbally) for opening containers/packages, retrieving items from floor, and flipping egg (recommended tilting pan and discussed grasp on spatula (hold like fork).   Also practiced adaptive buttoning/unbuttoning strategy and emphasized importance of opening hand prior to hand functional use.  Pt verbalized understanding.  Standing, PWR! Up, PWR rock to targets, and shoulder flex wall slides for stretch with min cueing for large amplitude.    Checked remaining goals and discussed progress and follow up recommendations.  Pt verbalized understanding/agreement.        OT Short Term Goals - 05/06/20 1007      OT SHORT TERM GOAL #1   Title --      OT SHORT TERM GOAL #2   Title --             OT Long Term Goals - 06/18/20 0854      OT LONG TERM GOAL #1   Title Pt will verbalize understanding of adaptive strategies to incr ease/efficiency with  ADLs/IADLs (including buttoning, picking up items from floor, opening containers, flipping eggs).--check LTGs 06/05/20    Time 4    Period Weeks    Status Achieved      OT LONG TERM GOAL #2   Title Pt will improve LUE functional reaching/coordination for ADLs as shown by improving score on box and blocks test by at least 3 blocks.    Baseline R-53 blocks, L-47 blocks    Time 4    Period Weeks    Status Not Met   06/18/20:  L-43 blocks     OT LONG TERM GOAL #3   Title Pt will improve L hand coordination for ADLs as shown by completing 9-hole peg test in less than 36sec    Baseline 38.66sec    Time 4    Period Weeks    Status Partially Met   06/18/20:  45sec, 35.78sec with PWR! hands prior to grasp (with cueing)     OT LONG TERM GOAL #4   Title Pt will improve bilateral hand coordination as shown by fastening/unfastening 3 buttons in 31sec or less.    Baseline 34.78sec    Time 4    Period Weeks    Status Not Met   06/18/20:  33.47sec     OT LONG TERM GOAL #5   Title Pt will be independent with updates to PD-specific HEP.    Time 4    Period Weeks    Status Achieved                 Plan - 06/18/20 1220    Clinical Impression Statement Pt verbalized understanding of adaptive strategies for ADLs.  Enforced importance of opening hand/stretching prior to grasp.    OT Occupational Profile and History Detailed Assessment- Review of Records and additional review of physical, cognitive, psychosocial history related to current functional performance    Occupational performance deficits (Please refer to evaluation for details): ADL's;IADL's;Leisure    Body Structure / Function / Physical Skills ADL;Dexterity;IADL;Balance;Mobility;Coordination;FMC;Tone;UE functional use;Decreased knowledge of use of DME;GMC;Endurance;Improper spinal/pelvic alignment;Flexibility   bradykinesia   Rehab Potential Good    Clinical Decision Making Several treatment options, min-mod task modification  necessary    Comorbidities Affecting Occupational Performance: May have comorbidities impacting occupational performance    Modification or Assistance to Complete Evaluation  Min-Moderate modification of tasks or assist with assess necessary to complete eval    OT Frequency 2x / week    OT Duration 4 weeks   +eval or 9 visits   OT Treatment/Interventions Self-care/ADL training;Moist Heat;Fluidtherapy;DME and/or AE instruction;Therapeutic activities;Aquatic Therapy;Therapeutic exercise;Functional Mobility Training;Neuromuscular education;Cryotherapy;Energy conservation;Manual Therapy;Patient/family education;Passive range of motion    Plan d/c OT; recommend OT screening in approx 6-9 months    OT Home Exercise Plan Education provided:  Coordination  HEP with focus on large amplitude, PWR! hands (basic 4)    Consulted and Agree with Plan of Care Patient           Patient will benefit from skilled therapeutic intervention in order to improve the following deficits and impairments:   Body Structure / Function / Physical Skills: ADL, Dexterity, IADL, Balance, Mobility, Coordination, FMC, Tone, UE functional use, Decreased knowledge of use of DME, GMC, Endurance, Improper spinal/pelvic alignment, Flexibility (bradykinesia)       Visit Diagnosis: Other symptoms and signs involving the nervous system  Other symptoms and signs involving the musculoskeletal system  Other lack of coordination  Abnormal posture  Unsteadiness on feet  Other abnormalities of gait and mobility  Tremor  Stiffness of left shoulder, not elsewhere classified  Stiffness of right shoulder, not elsewhere classified    Problem List Patient Active Problem List   Diagnosis Date Noted  . Parkinson's disease ([place]) 04/13/2017  . BPH (benign prostatic hypertrophy) with urinary retention 09/30/2015     OCCUPATIONAL THERAPY DISCHARGE SUMMARY  Visits from Start of Care: 6  Current functional level related to  goals / functional outcomes: See above   Remaining deficits: Bradykinesia, rigidity, decr coordination, abnormal posture, decr balance   Education / Equipment: Pt was instructed in the following:  PD-specific HEP, adaptive strategies for ADLs/IADLs.  Pt verbalized understanding of all education provided.   Plan: Patient agrees to discharge.  Patient goals were partially met. Patient is being discharged due to being pleased with the current functional level.  Pt would benefit from occupational therapy screen in approx 8-9 months to assess for need for further therapy/functional changes due to progressive nature of diagnosis.    [facility] 06/18/2020, 12:21 PM  [facility] [address] [place], [place], [address] Phone: [phone]   Fax:  [phone]  Name: Zachary Wang. MRN: [id number] Date of Birth: 1940/11/01   Vianne Bulls, OTR/L [facility] [address] [place], [place]  [address] [phone] phone [phone] 06/18/20 12:22 PM

## 2020-07-31 ENCOUNTER — Telehealth: Payer: Self-pay | Admitting: Neurology

## 2020-07-31 ENCOUNTER — Ambulatory Visit: Payer: No Typology Code available for payment source | Admitting: Neurology

## 2020-07-31 NOTE — Telephone Encounter (Signed)
Patient came by the office today to check in for an appointment that he rescheduled earlier this year to 2/16/222 with Dr. Carles Collet. He said he forgot that he rescheduled. Patient added to wait list for sooner availability.  Patient requested a call from Dr. Doristine Devoid medical assistant. He has some questions about re-certifying with the [place] since it has been about 3 years since his diagnosis for Parkinson's. He initially requested help from a Education officer, museum and is aware we do not have one on staff at this time.

## 2020-07-31 NOTE — Progress Notes (Signed)
Assessment/Plan:   1.  Parkinsons Disease  -Continue carbidopa/levodopa 25/100, 2/2/1  -Continue carbidopa/levodopa 50/200 at bedtime for cramping of feet/legs  -Continue pramipexole ER, 1.5 mg daily  -Genetic testing completed.  Heterozygous for GBA mutation.  This could provide a risk factor for inherited Parkinson's, but likely would also require an environmental component.  This is different from homozygous carrier, which would have a much greater risk factor (10-20 times).  -VA forms will be filled out.  2.  History of malignant melanoma  -Follows with dermatology.  Understands Parkinson's increases risk for melanoma as well.  3.  Dizziness  -improved.  Worse in the summer.  He felt he was hydrated then but better now.  Will need to monitor his BP meds.  BP was higher today but don't think that cuff was accurate (done on automated cuff unfortunately)  -Following with primary care.  4.  Sialorrhea  -Doing Botox.  Last Botox on October 1.  5.  Depression, mild  -doesn't want meds Subjective:   Zachary Wang. was seen today in follow up for Parkinsons disease.  My previous records were reviewed prior to todays visit as well as outside records available to me. Pt denies falls.  However, he notes trouble getting OOC if sitting there for a long time.  Occasional unstable walking if sitting for a long time.  Some trouble with getting OOB (even though bed is appropriate height).    Last visit, the patient was having some dizziness, which I thought was related to multiple blood pressure medications.  Patient states that he followed up with primary care since that time and he states that he is doing better with dizziness.  He states that it was bad over the summer but it is better now.  no hallucinations.  Mood has been good.  Has VA forms for me.  Notes complete loss of smell since last forms completed (not acute).  He is feeling stiff - doing yoga and spin class for exercise in addition  to golf.  He is drooling - last botox is wearing off.  Some trouble with chewing b/c of that (mouth seems to "quiver").  Admits to some depression.   He doesn't want something for mood at this time.   States that also having some occ tremor on the R side (not all of the time).  If under pressure/stress and is time crunched, he will have trouble with buttons and trouble with bow ties.  Notes trouble with sexual dysfunction - cannot maintain erection.    Current prescribed movement disorder medications: Carbidopa/levodopa 25/100, 2/2/1 Pramipexole ER, 1.5 mg daily Carbidopa/levodopa 50/200 CR at bedtime (added last visit for cramping in the feet and legs) - helps that with occasional breakthrough  ALLERGIES:  No Known Allergies  CURRENT MEDICATIONS:  Outpatient Encounter Medications as of 08/01/2020  Medication Sig  . amLODipine (NORVASC) 5 MG tablet Take 5 mg by mouth every morning.  Marland Kitchen atorvastatin (LIPITOR) 20 MG tablet Take 20 mg by mouth every evening.   . hydrochlorothiazide (HYDRODIURIL) 25 MG tablet Take 25 mg by mouth every morning.   Marland Kitchen losartan (COZAAR) 100 MG tablet Take 50 mg by mouth 2 (two) times daily.  . Pramipexole Dihydrochloride 1.5 MG TB24 Take 1 tablet (1.5 mg total) by mouth 1 day or 1 dose. (Patient taking differently: Take 1 tablet by mouth daily.)  . [DISCONTINUED] carbidopa-levodopa (SINEMET CR) 50-200 MG tablet Take 1 tablet by mouth at bedtime.  . [DISCONTINUED]  carbidopa-levodopa (SINEMET IR) 25-100 MG tablet 2 at 7am, 2 at 11am, 1 at 4pm  . carbidopa-levodopa (SINEMET CR) 50-200 MG tablet Take 1 tablet by mouth at bedtime.  . carbidopa-levodopa (SINEMET IR) 25-100 MG tablet 2 at 7am, 2 at 11am, 1 at 4pm   No facility-administered encounter medications on file as of 08/01/2020.    Objective:   PHYSICAL EXAMINATION:    VITALS:   Vitals:   08/01/20 1453  BP: (!) 170/81  Pulse: 80  Resp: 20  SpO2: 96%  Weight: 229 lb (103.9 kg)  Height: 6\' 2"  (1.88 m)     GEN:  The patient appears stated age and is in NAD. HEENT:  Normocephalic, atraumatic.  The mucous membranes are moist. The superficial temporal arteries are without ropiness or tenderness. CV:  RRR Lungs:  CTAB Neck/HEME:  There are no carotid bruits bilaterally.  Neurological examination:  Orientation: The patient is alert and oriented x3. Cranial nerves: There is good facial symmetry with facial hypomimia. The speech is fluent and clear. Soft palate rises symmetrically and there is no tongue deviation. Hearing is intact to conversational tone. Sensation: Sensation is intact to light touch throughout Motor: Strength is at least antigravity x4.  Movement examination: Tone: There is mild increased tone in the left upper extremity and mild to mod in the RUE Abnormal movements: none Coordination:  There is mild to mod decremation with RAM's, with any form of RAMS, including alternating supination and pronation of the forearm, hand opening and closing, finger taps, heel taps and toe taps, LE > UE Gait and Station: The patient has min difficulty arising out of a deep-seated chair without the use of the hands. The patient's stride length is slightly decreased but with good arm swing.    I have reviewed and interpreted the following labs independently    Chemistry      Component Value Date/Time   NA 140 09/30/2015 0656   K 4.0 09/30/2015 0656   CL 105 05/15/2010 1000   CO2 27 05/15/2010 1000   BUN 15 05/15/2010 1000   CREATININE 1.23 05/15/2010 1000      Component Value Date/Time   CALCIUM 9.4 05/15/2010 1000       Lab Results  Component Value Date   HGB 12.9 (L) 09/30/2015   HCT 38.0 (L) 09/30/2015    No results found for: TSH   Total time spent on today's visit was 40 minutes, including both face-to-face time and nonface-to-face time.  Time included that spent on review of records (prior notes available to me/labs/imaging if pertinent), discussing treatment and goals,  answering patient's questions and coordinating care.  Cc:  Prince Solian, MD

## 2020-07-31 NOTE — Telephone Encounter (Signed)
Spoke with patient who states he has applied for benefits from the [place] and wants to know if he can drop the forms off and we complete them. He states the form is called Parkinson's Disease Disability Benefits Questionnaire. He states the last time we completed this form was in 2018. Patient states he thought he had an appointment today but when he got here he did not. He wants to know if he needs to wait until his appointment for forms to be completed.   Advised that I would speak with Dr Tat and get back to him. He voiced understanding.

## 2020-07-31 NOTE — Telephone Encounter (Signed)
Update: Patient moved up to tomorrow for follow up with Dr. Carles Collet. He will bring the form then.

## 2020-07-31 NOTE — Telephone Encounter (Signed)
Left message for patient to contact office.

## 2020-07-31 NOTE — Telephone Encounter (Signed)
He can drop forms off - Darden Dates, his old form is scanned.  There is generally a form fee associated with these.

## 2020-07-31 NOTE — Telephone Encounter (Signed)
Spoke with patient and informed him to bring forms to his appointment tomorrow.   He voiced understanding.

## 2020-08-01 ENCOUNTER — Encounter: Payer: Self-pay | Admitting: Neurology

## 2020-08-01 ENCOUNTER — Other Ambulatory Visit: Payer: Self-pay

## 2020-08-01 ENCOUNTER — Ambulatory Visit (INDEPENDENT_AMBULATORY_CARE_PROVIDER_SITE_OTHER): Payer: No Typology Code available for payment source | Admitting: Neurology

## 2020-08-01 VITALS — BP 170/81 | HR 80 | Resp 20 | Ht 74.0 in | Wt 229.0 lb

## 2020-08-01 DIAGNOSIS — G20 Parkinson's disease: Secondary | ICD-10-CM

## 2020-08-01 DIAGNOSIS — K117 Disturbances of salivary secretion: Secondary | ICD-10-CM | POA: Diagnosis not present

## 2020-08-01 MED ORDER — CARBIDOPA-LEVODOPA ER 50-200 MG PO TBCR: 1.0000 | EXTENDED_RELEASE_TABLET | Freq: Every day | ORAL | 2 refills | Status: DC

## 2020-08-01 MED ORDER — CARBIDOPA-LEVODOPA 25-100 MG PO TABS: ORAL_TABLET | ORAL | 2 refills | Status: DC

## 2020-08-23 ENCOUNTER — Other Ambulatory Visit: Payer: Self-pay

## 2020-08-23 ENCOUNTER — Ambulatory Visit (INDEPENDENT_AMBULATORY_CARE_PROVIDER_SITE_OTHER): Payer: No Typology Code available for payment source | Admitting: Neurology

## 2020-08-23 DIAGNOSIS — K117 Disturbances of salivary secretion: Secondary | ICD-10-CM | POA: Diagnosis not present

## 2020-08-23 MED ORDER — RIMABOTULINUMTOXINB 5000 UNIT/ML IM SOLN
5000.0000 [IU] | Freq: Once | INTRAMUSCULAR | Status: AC
  Administered 2020-08-23: 5000 [IU] via INTRAMUSCULAR

## 2020-08-23 NOTE — Procedures (Signed)
[facility]    History:  Diagnosis: Sialorrhea    Result History  Working well.    Consent obtained from: The patient The patient was educated on the botulinum toxin the black blox warning and given a copy of the botox patient medication guide.  The patient understands that this warning states that there have been reported cases of the Botox extending beyond the injection site and creating adverse effects, similar to those of botulism. This included loss of strength, trouble walking, hoarseness, trouble saying words clearly, loss of bladder control, trouble breathing, trouble swallowing, diplopia, blurry vision and ptosis. Most of the distant spread of Botox was happening in patients, primarily children, who received medication for spasticity or for cervical dystonia. The patient expressed understanding and desire to proceed.     Injections  Location Left  Right Units Number of sites  Submandibular gland 250 250 500 1 per side  Parotid 2250 2250 2500 1 per side  TOTAL UNITS:     5000      Type of Toxin: Myobloc type B As ordered and injected IM at today's visit Total Units: 5000  Discarded Units: 0  Needle drawback with each injection was free of blood. Pt tolerated procedure well without complications.   Reinjection is anticipated in 3 months.                 

## 2020-09-27 ENCOUNTER — Ambulatory Visit: Payer: No Typology Code available for payment source | Admitting: Neurology

## 2020-10-02 ENCOUNTER — Ambulatory Visit: Payer: No Typology Code available for payment source | Admitting: Neurology

## 2020-11-22 ENCOUNTER — Ambulatory Visit: Payer: No Typology Code available for payment source | Admitting: Neurology

## 2020-12-06 ENCOUNTER — Other Ambulatory Visit: Payer: Self-pay

## 2020-12-06 ENCOUNTER — Ambulatory Visit (INDEPENDENT_AMBULATORY_CARE_PROVIDER_SITE_OTHER): Payer: Medicare Other | Admitting: Neurology

## 2020-12-06 DIAGNOSIS — K117 Disturbances of salivary secretion: Secondary | ICD-10-CM | POA: Diagnosis not present

## 2020-12-06 MED ORDER — RIMABOTULINUMTOXINB 5000 UNIT/ML IM SOLN
5000.0000 [IU] | Freq: Once | INTRAMUSCULAR | Status: AC
  Administered 2020-12-06: 5000 [IU] via INTRAMUSCULAR

## 2020-12-06 NOTE — Procedures (Signed)
[facility]    History:  Diagnosis: Sialorrhea    Result History  Working well.    Consent obtained from: The patient The patient was educated on the botulinum toxin the black blox warning and given a copy of the botox patient medication guide.  The patient understands that this warning states that there have been reported cases of the Botox extending beyond the injection site and creating adverse effects, similar to those of botulism. This included loss of strength, trouble walking, hoarseness, trouble saying words clearly, loss of bladder control, trouble breathing, trouble swallowing, diplopia, blurry vision and ptosis. Most of the distant spread of Botox was happening in patients, primarily children, who received medication for spasticity or for cervical dystonia. The patient expressed understanding and desire to proceed.     Injections  Location Left  Right Units Number of sites  Submandibular gland 250 250 500 1 per side  Parotid 2250 2250 2500 1 per side  TOTAL UNITS:     5000      Type of Toxin: Myobloc type B As ordered and injected IM at today's visit Total Units: 5000  Discarded Units: 0  Needle drawback with each injection was free of blood. Pt tolerated procedure well without complications.   Reinjection is anticipated in 3 months.                 

## 2021-01-05 NOTE — Progress Notes (Signed)
Assessment/Plan:   1.  Parkinsons Disease  -Continue carbidopa/levodopa 25/100, 2/2/1  -Continue carbidopa/levodopa 50/200 at bedtime for cramping of feet/legs  -Continue pramipexole ER, 1.5 mg daily.  No compulsive behaviors.  Change to PM dosing to see if helps with fatigue.  If that doesn't help, we can wean off of it or change to regular, non ER dosing.  In the meantime, he will f/u with [facility] and see if PSA elevation is contributing factor.    -Genetic testing completed.  Heterozygous for GBA mutation.  This could provide a risk factor for inherited Parkinson's, but likely would also require an environmental component.  This is different from homozygous carrier, which would have a much greater risk factor (10-20 times).  -he asked about clinical trials in [place] for cbd.  I didn't see any recruiting.  Gave information to [facility] clinical trials.  2.  History of malignant melanoma  -Follows with dermatology.   -We discussed that it used to be thought that levodopa would increase risk of melanoma but now it is believed that Parkinsons itself likely increases risk of melanoma. he is to get regular skin checks.  3.  Sialorrhea  -better with myobloc.  Will continue   Subjective:   Zachary Wang. was seen today in follow up for Parkinsons disease.  My previous records were reviewed prior to todays visit as well as outside records available to me. Pt denies falls.  Pt feeling a little sluggish last month or so but he noted that his PSA has jumped up really high.  He has referral to [facility].  sluggist is described as "no energy."  He feels that way even after first getting out of bed.  Pt denies lightheadedness, near syncope.  No hallucinations.  Mood has been good.  Continues to get botox for sialorrhea - last 4/22 - helping  Current prescribed movement disorder medications: Carbidopa/levodopa 25/100, 2/2/1 Pramipexole ER, 1.5 mg daily Carbidopa/levodopa 50/200 CR at  bedtime   ALLERGIES:  No Known Allergies  CURRENT MEDICATIONS:  Outpatient Encounter Medications as of 01/07/2021  Medication Sig  . amLODipine (NORVASC) 5 MG tablet Take 5 mg by mouth every morning.  Marland Kitchen atorvastatin (LIPITOR) 20 MG tablet Take 20 mg by mouth every evening.   . carbidopa-levodopa (SINEMET CR) 50-200 MG tablet Take 1 tablet by mouth at bedtime.  . carbidopa-levodopa (SINEMET IR) 25-100 MG tablet 2 at 7am, 2 at 11am, 1 at 4pm  . hydrochlorothiazide (HYDRODIURIL) 25 MG tablet Take 25 mg by mouth every morning.   Marland Kitchen losartan (COZAAR) 100 MG tablet Take 50 mg by mouth 2 (two) times daily.  . Pramipexole Dihydrochloride 1.5 MG TB24 Take 1 tablet (1.5 mg total) by mouth 1 day or 1 dose. (Patient taking differently: Take 1 tablet by mouth daily.)   No facility-administered encounter medications on file as of 01/07/2021.    Objective:   PHYSICAL EXAMINATION:    VITALS:   Vitals:   01/07/21 0807  BP: 108/62  Pulse: 63  SpO2: 97%  Weight: 224 lb (101.6 kg)  Height: 6\' 2"  (1.88 m)    GEN:  The patient appears stated age and is in NAD. HEENT:  Normocephalic, atraumatic.  The mucous membranes are moist. The superficial temporal arteries are without ropiness or tenderness. CV:  RRR Lungs:  CTAB Neck/HEME:  There are no carotid bruits bilaterally.  Neurological examination:  Orientation: The patient is alert and oriented x3. Cranial nerves: There is good facial symmetry  with facial hypomimia. The speech is fluent and clear. Soft palate rises symmetrically and there is no tongue deviation. Hearing is intact to conversational tone. Sensation: Sensation is intact to light touch throughout Motor: Strength is at least antigravity x4.  Movement examination: Tone: There is nl tone in the UE bilaterally (improved) Abnormal movements: none Coordination:  There is min decremation, with any form of RAMS, including alternating supination and pronation of the forearm, hand opening and  closing, finger taps, heel taps and toe taps on the L Gait and Station: The patient has min difficulty arising out of a deep-seated chair without the use of the hands. The patient's stride length is slightly decreased but with good arm swing.     Total time spent on today's visit was 30 minutes, including both face-to-face time and nonface-to-face time.  Time included that spent on review of records (prior notes available to me/labs/imaging if pertinent), discussing treatment and goals, answering patient's questions and coordinating care.  Cc:  Prince Solian, MD

## 2021-01-07 ENCOUNTER — Ambulatory Visit (INDEPENDENT_AMBULATORY_CARE_PROVIDER_SITE_OTHER): Payer: Medicare Other | Admitting: Neurology

## 2021-01-07 ENCOUNTER — Encounter: Payer: Self-pay | Admitting: Neurology

## 2021-01-07 ENCOUNTER — Other Ambulatory Visit: Payer: Self-pay

## 2021-01-07 VITALS — BP 108/62 | HR 63 | Ht 74.0 in | Wt 224.0 lb

## 2021-01-07 DIAGNOSIS — G20 Parkinson's disease: Secondary | ICD-10-CM | POA: Diagnosis not present

## 2021-01-07 MED ORDER — CARBIDOPA-LEVODOPA 25-100 MG PO TABS: ORAL_TABLET | ORAL | 2 refills | Status: DC

## 2021-01-07 MED ORDER — CARBIDOPA-LEVODOPA ER 50-200 MG PO TBCR: 1.0000 | EXTENDED_RELEASE_TABLET | Freq: Every day | ORAL | 2 refills | Status: DC

## 2021-01-07 MED ORDER — PRAMIPEXOLE DIHYDROCHLORIDE ER 1.5 MG PO TB24: 1.0000 | ORAL_TABLET | Freq: Every day | ORAL | 1 refills | Status: DC

## 2021-01-07 NOTE — Patient Instructions (Addendum)
1.  Change pramipexole ER to BEDTIME dosing to see if it helps with energy levels. If it doesn't we can change this medication all together (either changing it to non extended release formulation or just weaning it off)  If you are interested in clinical trials, you can look up all clinical trials available in the [place] by looking at [email or web address]  [facility] may have some trials going on.  You can contact;  Gerline Legacy - clinical trials coordinator  [phone]  [email or web address]   Maryln Gottron - program manager  [phone]  [email or web address]    T-Shirt design contest   SUBMIT UNIQUE DESIGN TO WIN!  Winning design will be featured on T-shirts to support our local Parkinson's patients.   Contest rules and information in on link below .   Contest ends on August 31,2022    How to [employer] your own unique quote or a design that will stand out and support the ideas behind Parkinson's Awareness  This contest is open to everyone. The winning design or quote will be selected by a group of unbiased judges. Judges will vote based on quality, relevance, and aesthetic. Judges will not have access to the names associated with the submission. Submissions accepted until: April 16, 2021 What You Win Your own quote or design will be printed on a t-shirt that will support Parkinson's Awareness!! Feel good knowing your quote/artwork will help spread awareness for Parkinson's contribute and 100% of profits to the local area . Tips for Submissions We will accept submissions in the form of quotes, Financial risk analyst, or hand drawn artwork.  Mission/Theme: The Power of One ! Working together to support and treat to improve quality of life for Parkinson's Patients Rules & Regulations All designs and quotes must be original and not subject to copyright of another person or entity. Distribution and Reproduction Rights for all quotes and artwork will be transferred to [facility] [employer] upon submission to the contest via the [employer] form on April 16, 2021  Online Resources for Power over Parkinson's Group May 2022  . Local [place] Online Groups  o Power over [employer] Group :   - Power Over Parkinson's Patient Education Group will be Wednesday, May 11th at 2pm via Zoom.   - Upcoming Power over Parkinson's Meetings:  2nd Wednesdays of the month at 2 pm:  June 8th, July 13th - Contact Amy Marriott at [email or web address] if interested in participating in this online group o Parkinson's Care Partners Group:    3rd Mondays, Contact Misty Paladino o Atypical Parkinsonian Patient Group:   4th Wednesdays, Contact Misty Paladino o If you are interested in participating in these online groups with Misty, please contact her directly for how to join those meetings.  Her contact information is [email or web address].   . [place]:  [email or web address] o PD Health at Home continues:  Mindfulness Mondays, Expert Briefing Tuesdays, Wellness Wednesdays, Take Time Thursdays, Fitness Fridays -Listings for May 2022 are on the website o Upcoming Webinar:  Newly Diagnosed Building a Better Life with Parkinson   Wednesday, May 18th @ 1 pm o Register for Armed forces operational officer) at [email or web address] o  Please check out their website to sign up for emails and see their full online offerings  . [place]:  [email or web address]  o Upcoming Webinar:   What's in your DNA, understanding Parkinson's genetics.  Thursday, May 19th @ 12 noon o Check out additional information on their website to see their full online offerings  .  [place]:  [email or web address] o Upcoming Webinar:  Stay tuned o Care Partner Monthly Meetup.  With Robin Searing Phinney.  First Tuesday of each month, 2 pm o Joy Breaks:  First Wednesday of each month, 2-3 pm. There will be art, doodling, making,  crafting, listening, laughing, stories, and everything in between. No art experience necessary. No supplies required. Just show up for joy!  Register on their website. o Check out additional information to Live Well Today on their website  . Parkinson and Movement Disorders (PMD) Alliance:  www.pmdalliance.org o [employer]Life Online:  Online Education Events o Sign up for emails, which are sent weekly to give you updates on programming and online offerings     . Parkinson's Association of the [place]:  www.parkinsonassociation.org o Information on online support groups, education events, and online exercises including Yoga, Parkinson's exercises and more-LOTS of information on links to PD resources and online events o Virtual Support Group through Parkinson's Association of the [place]; next one is scheduled for Wednesday, May 4th, 2022 at 2 pm. (These are typically scheduled for the 1st Wednesday of the month at 2 pm).  Visit website for details.  . Additional links for movement activities: o PWR! Moves Classes at [place] RESUMED!  Wednesdays 10 and 11 am.  Contact Amy Marriott, PT [email or web address] or [phone] if interested o Here is a link to the PWR!Moves classes on Zoom from [place] - Daily Mon-Sat at 10:00. Via Zoom, FREE and open to all.  There is also a link below via Facebook if you use that platform. - [email or web address].si - https://www.PrepaidParty.no o Parkinson's Wellness Recovery (PWR! Moves)  www.pwr4life.org - Info on the PWR! Virtual Experience:  You will have access to our expertise through self-assessment, guided plans that start with the PD-specific fundamentals, educational content, tips, Q&A with an expert, and a growing Art therapist of PD-specific  pre-recorded and live exercise classes of varying types and intensity - both physical and cognitive! If that is not enough, we offer 1:1 wellness consultations (in-person or virtual) to personalize your PWR! Research scientist (medical).  - Check out the PWR! Move of the month on the [place] [facility] website:  [email or web address] o [employer] Fridays:  - As part of the PD Health @ Home program, this free video series focuses each week on one aspect of fitness designed to support people living with Parkinson's.  These weekly videos highlight the Wade recent fitness guidelines for people with Parkinson's disease. -  HollywoodSale.dk o Dance for PD website is offering free, live-stream classes throughout the week, as well as links to [employer] of classes:  [email or web address]://[email or web address]/ o Dance for Parkinson's Class:  [place].  Free offering for people with Parkinson's and care partners; virtual class.  o For more information, contact [phone] or email Ruffin Frederick at [email or web address] o Virtual dance and Pilates for Parkinson's classes: Click on the Community Tab> Parkinson's Movement Initiative Tab.  To register for classes and for more information, visit www.[email or web address] and click the "community" tab.     o [employer] Parkinson's Cycling Classes  - [facility] [employer]: 1pm on Fridays-Live classes at [employer] ([employer] at [email or web address] or [phone]) [facility] [employer]: Virtual Classes Mondays and Thursdays Jeanette Caprice classes Tuesday, Wednesday and Thursday (contact Wright at [email or web address]  or [phone])  o [facility] Boxing - Three levels of classes are offered Tuesdays and Thursdays:  10:30 am,  12 noon & 1:45 pm at [facility].  -  Active  Stretching with Paula Compton Class starting in March, on Fridays - To observe a class or for  more information, call [phone] or email [email or web address] . Well-Spring Solutions: o Chief Technology Officer Opportunities:  www.well-springsolutions.org/caregiver-education/caregiver-support-group.  You may also contact Vickki Muff at [email or web address] or [phone].   o [facility] for Family Caregivers! Thursday, May 12th 10:00a-1:30p [place] 09-13-1996, [employer], [place], [place] You may contact [address] at [email or web address] or [phone].   o Well-Spring Navigator:  Just1Navigator program, a free service to help individuals and families through the journey of determining care for older adults.  The "Navigator" is a 10-17-1986, [phone], who will speak with a prospective client and/or loved ones to provide an assessment of the situation and a set of recommendations for a personalized care plan -- all free of charge, and whether Well-Spring [employer] offers the needed service or not. If the need is not a service we provide, we are well-connected with reputable programs in town that we can refer you to.  www.well-springsolutions.org or to speak with the Navigator, call [phone].

## 2021-01-16 ENCOUNTER — Telehealth: Payer: Self-pay | Admitting: Neurology

## 2021-01-16 NOTE — Telephone Encounter (Signed)
Patient called in stating he has been getting his medication from the [place]. When they got the new prescription they stated a request for additional services needs to be sent to them for the patient. He said our Education officer, museum had to do some stuff a while back.

## 2021-01-22 NOTE — Telephone Encounter (Signed)
Called patient and explained what Dr. Carles Collet Stated below about not being able to send a community referral and that we can send his rx to an outside pharmacy.   Patient stated that what he needs is Dr. Carles Collet to write up a general statement in a form of a letter stating that he is a patient of hers since 2018, his dx of parkinson's, the care he receives and that there is no cure (are some examples patient provided). Patient stated that this letter will be given to the [place] and that they will put it on his file.   Informed patient that I would send Dr. Carles Collet a message and get back to him as soon as I hear back.

## 2021-01-22 NOTE — Telephone Encounter (Signed)
Per Dr. Carles Collet she cannot request the community referral. Patient will need to get it from the [place],, Dr. Carles Collet stated that patient could make an appt with the [facility] and have the physician do it for him.  Per Dr. Carles Collet can send rx to outside pharmacy.

## 2021-01-26 ENCOUNTER — Encounter: Payer: Self-pay | Admitting: Neurology

## 2021-01-27 NOTE — Telephone Encounter (Signed)
Letter printed. Pending signature. Then will place call to patient

## 2021-01-27 NOTE — Telephone Encounter (Signed)
Called patient and informed him that his letter was ready for pick up. Patient thanked [place] and will come to the office to pick up.  Patient had no further questions or concerns.

## 2021-01-28 ENCOUNTER — Telehealth: Payer: Self-pay | Admitting: Neurology

## 2021-01-28 NOTE — Telephone Encounter (Signed)
Tried both extension, phone just rings, will call back at 149 01/28/2021

## 2021-01-28 NOTE — Telephone Encounter (Signed)
[name] called in and needs a call back regarding Zachary Wang's medicine. Can call [phone] ext [phone] or [phone]. Needs clarification on Pramipexole Dihydrochlorine  1.5mg  TB42

## 2021-02-04 NOTE — Telephone Encounter (Signed)
Hes taking a 1.5 mg non extended release once per day??  Who RX that?  Was that a pharmacy error?  It doesn't work that way.  If its for cost he would need pramipexole 0.5 mg tid (to make a total of 1.5 mg daily) or take what we prescribed, which was the pramipexole ER, 1.5 mg daily

## 2021-02-04 NOTE — Telephone Encounter (Signed)
[phone] ext [phone] or [phone]  [name] or serena regarding [facility] medicine. Michela Pitcher it looks like they have the wrong dosage on file. Hes been taking the IR not 1.5 ER 24/hr

## 2021-02-05 NOTE — Telephone Encounter (Signed)
Zachary Wang at [phone] ext [phone] and informed her Per Dr. Carles Collet: "Hes taking a 1.5 mg non extended release once per day??  Who RX that?  Was that a pharmacy error?  It doesn't work that way.  If its for cost he would need pramipexole 0.5 mg tid (to make a total of 1.5 mg daily) or take what we prescribed, which was the pramipexole ER, 1.5 mg daily"  [name] thanked me for the clarification and will give [place] a call if there are any issues.

## 2021-02-27 ENCOUNTER — Ambulatory Visit: Payer: No Typology Code available for payment source | Admitting: Physical Therapy

## 2021-02-27 ENCOUNTER — Ambulatory Visit: Payer: No Typology Code available for payment source | Attending: Internal Medicine

## 2021-02-27 ENCOUNTER — Other Ambulatory Visit: Payer: Self-pay

## 2021-02-27 ENCOUNTER — Telehealth: Payer: Self-pay | Admitting: Physical Therapy

## 2021-02-27 ENCOUNTER — Ambulatory Visit: Payer: No Typology Code available for payment source | Admitting: Occupational Therapy

## 2021-02-27 DIAGNOSIS — R471 Dysarthria and anarthria: Secondary | ICD-10-CM | POA: Insufficient documentation

## 2021-02-27 DIAGNOSIS — R2689 Other abnormalities of gait and mobility: Secondary | ICD-10-CM | POA: Insufficient documentation

## 2021-02-27 DIAGNOSIS — R490 Dysphonia: Secondary | ICD-10-CM | POA: Insufficient documentation

## 2021-02-27 DIAGNOSIS — G20 Parkinson's disease: Secondary | ICD-10-CM

## 2021-02-27 DIAGNOSIS — R278 Other lack of coordination: Secondary | ICD-10-CM

## 2021-02-27 NOTE — Telephone Encounter (Signed)
Voice mail left or pt to call the office back

## 2021-02-27 NOTE — Therapy (Signed)
[facility] [address] [place], [place], [address] Phone: [phone]   Fax:  [phone]  Patient Details  Name: Zachary Wang. MRN: [id number] Date of Birth: 1940/10/04 Referring Provider:  Prince Solian, MD  Encounter Date: 02/27/2021  Physical Therapy Parkinson's Disease Screen   Timed Up and Go test: 8.53 sec  10 meter walk test: 8.87 sec = 3.7 ft/sec  5 time sit to stand test: 13.35 sec  Patient would benefit from Physical Therapy evaluation due to pt's reported difficulty in car transfers, sofa transfers, balance at night.  Given that it has been at least 3 years since previous bout of therapy, he would benefit from eval to attempt to address these issues.      Yeni Jiggetts W. 02/27/2021, 8:14 AM  Mady Haagensen, PT 02/27/21 8:35 AM Phone: [phone] Fax: [facility] [address] [place], [place], [address] Phone: [phone]   Fax:  [phone]

## 2021-02-27 NOTE — Telephone Encounter (Signed)
Dr. Carles Collet,  Mr. Zachary Wang was seen for PD screens at our multi-discplinary clinic this morning.  He would benefit from occupational therapy to address slowed coordination measures and from physical therapy to address reports of difficulty with low surface transfers and balance difficulties.  If you agree, could you please send orders for PT and OT?  He is a [place] patient, so I do not know if this means you have to place the order differently.  Thank you.  Mady Haagensen, PT 02/27/21 11:13 AM Phone: [phone] Fax: [phone]

## 2021-02-27 NOTE — Therapy (Signed)
[facility] [address] [place], [place], [address] Phone: [phone]   Fax:  [phone]  Patient Details  Name: Kartier Bennison. MRN: [id number] Date of Birth: Jan 04, 1941 Referring Provider:  Prince Solian, MD  Encounter Date: 02/27/2021 Occupational Therapy Parkinson's Disease Screen  Hand dominance:  right      Fastening/unfastening 3 buttons in:  1 min 7 secs sec  9-hole peg test:    RUE  30.12 sec        LUE  43.85 sec  Box & Blocks Test:   RUE  53 blocks        LUE  51 blocks   Change in ability to perform ADLs/IADLs:  increased time required for ADLs.  Other Comments:handwriting is legible  Pt would benefit from occupational therapy evaluation due to  decline in fine motor coordination, increased time required for ADLs.  Pt is agreeable to short course of OT however he is going out of town in August and will [facility]. Anticipate possible OT after pt returns.  Calen Posch 02/27/2021, 8:12 AM  [facility] [address] [place], [place], [address] Phone: [phone]   Fax:  [phone]

## 2021-02-27 NOTE — Telephone Encounter (Signed)
[facility]. [facility] have to get [facility] with [facility]  call Ms Brooks Sailors at [phone] for help with approval

## 2021-02-27 NOTE — Therapy (Signed)
[facility] [address] [place], [place], [address] Phone: [phone]   Fax:  [phone]  Patient Details  Name: Zachary Wang. MRN: [id number] Date of Birth: [birth year] Referring Provider:  Alonza Bogus, DO  Encounter Date: 02/27/2021   Speech Therapy Parkinson's Disease Screen   Decibel Level today: low 70sdB  (WNL=70-72 dB) with sound level meter 30cm away from pt's masked mouth. Pt's conversational volume has remained in normal limits since pt's last treatment course. Firas said he was better with performing loud "ah" during this last reporting cycle than prior. SLP strongly encouraged pt to continue loud /a/ EVERY DAY and to read out loud 30-60 seconds in a louder, more effortful exuberant voice each day in order to keep muscle memory for WNL speech loudness. Pt's vocal quality was also deemed WNL.   Pt does not report difficulty in swallowing warranting further evaluation. He says he inconsistently coughs, randomly, throughout the day, which SLP stated might be hyposensitivity to his saliva in the vocal tract and recommended chewing a half stick or stick of gum throughout the day to foster swallowing reflex/response.   Pt does does not require speech therapy services at this time. Recommend ST screen in another 6 months.   [facility], [place]  02/27/2021, 8:26 AM  [facility] [address] [place], [place], [address] Phone: [phone]   Fax:  [phone]

## 2021-02-27 NOTE — Telephone Encounter (Signed)
Ok to order PT and OT, pls check if patient wants it done through the [place] or [facility], thanks

## 2021-03-12 ENCOUNTER — Other Ambulatory Visit: Payer: Self-pay | Admitting: Urology

## 2021-03-12 DIAGNOSIS — R972 Elevated prostate specific antigen [PSA]: Secondary | ICD-10-CM

## 2021-03-21 ENCOUNTER — Ambulatory Visit: Payer: No Typology Code available for payment source | Admitting: Neurology

## 2021-03-25 ENCOUNTER — Telehealth: Payer: Self-pay | Admitting: Neurology

## 2021-03-25 DIAGNOSIS — G20 Parkinson's disease: Secondary | ICD-10-CM

## 2021-03-25 NOTE — Telephone Encounter (Signed)
Pt called in stating Dr. Carles Collet wanted him to do Occupational Therapy and they were trying to get a referral sent to the [facility]. They did not have anything as of this morning. He found a [place] fax number to send the referral to with [facility]. The Fax number is [phone].

## 2021-03-27 NOTE — Telephone Encounter (Signed)
Called patient and informed him that Dr. Carles Collet does not mind sending the referral and wanted to make him aware that the [facility] at times will not accept outside referrals. Patient verbalized understanding and would still like me to proceed and send the OT referral to the [facility] department. Patient is aware referral is being faxed today.

## 2021-04-02 ENCOUNTER — Other Ambulatory Visit: Payer: Self-pay

## 2021-04-02 ENCOUNTER — Ambulatory Visit
Admission: RE | Admit: 2021-04-02 | Discharge: 2021-04-02 | Disposition: A | Discharge status: Home / Self Care | Payer: No Typology Code available for payment source | Source: Ambulatory Visit | Attending: Urology | Admitting: Urology

## 2021-04-02 DIAGNOSIS — R972 Elevated prostate specific antigen [PSA]: Secondary | ICD-10-CM

## 2021-04-02 MED ORDER — GADOBENATE DIMEGLUMINE 529 MG/ML IV SOLN
20.0000 mL | Freq: Once | INTRAVENOUS | Status: AC | PRN
  Administered 2021-04-02: 20 mL via INTRAVENOUS

## 2021-04-16 ENCOUNTER — Other Ambulatory Visit: Payer: Self-pay

## 2021-04-18 ENCOUNTER — Ambulatory Visit (INDEPENDENT_AMBULATORY_CARE_PROVIDER_SITE_OTHER): Payer: No Typology Code available for payment source | Admitting: Neurology

## 2021-04-18 ENCOUNTER — Other Ambulatory Visit: Payer: Self-pay

## 2021-04-18 DIAGNOSIS — K117 Disturbances of salivary secretion: Secondary | ICD-10-CM

## 2021-04-18 MED ORDER — RIMABOTULINUMTOXINB 5000 UNIT/ML IM SOLN
5000.0000 [IU] | Freq: Once | INTRAMUSCULAR | Status: AC
  Administered 2021-04-18: 5000 [IU] via INTRAMUSCULAR

## 2021-04-18 NOTE — Procedures (Signed)
Botulinum Clinic    History:  Diagnosis: Sialorrhea    Result History  Working well.  Was a month late for injections and def noted wearing off  Consent obtained from: The patient The patient was educated on the botulinum toxin the black blox warning and given a copy of the botox patient medication guide.  The patient understands that this warning states that there have been reported cases of the Botox extending beyond the injection site and creating adverse effects, similar to those of botulism. This included loss of strength, trouble walking, hoarseness, trouble saying words clearly, loss of bladder control, trouble breathing, trouble swallowing, diplopia, blurry vision and ptosis. Most of the distant spread of Botox was happening in patients, primarily children, who received medication for spasticity or for cervical dystonia. The patient expressed understanding and desire to proceed.     Injections  Location Left  Right Units Number of sites  Submandibular gland 250 250 500 1 per side  Parotid 2250 2250 2500 1 per side  TOTAL UNITS:     5000      Type of Toxin: Myobloc type B As ordered and injected IM at today's visit Total Units: 5000  Discarded Units: 0  Needle drawback with each injection was free of blood. Pt tolerated procedure well without complications.   Reinjection is anticipated in 3 months.

## 2021-04-29 ENCOUNTER — Other Ambulatory Visit: Payer: Self-pay

## 2021-04-29 ENCOUNTER — Encounter: Payer: Self-pay | Admitting: Occupational Therapy

## 2021-04-29 ENCOUNTER — Ambulatory Visit: Payer: No Typology Code available for payment source | Attending: Internal Medicine | Admitting: Occupational Therapy

## 2021-04-29 DIAGNOSIS — R251 Tremor, unspecified: Secondary | ICD-10-CM | POA: Diagnosis present

## 2021-04-29 DIAGNOSIS — R29818 Other symptoms and signs involving the nervous system: Secondary | ICD-10-CM | POA: Insufficient documentation

## 2021-04-29 DIAGNOSIS — R2681 Unsteadiness on feet: Secondary | ICD-10-CM | POA: Insufficient documentation

## 2021-04-29 DIAGNOSIS — R293 Abnormal posture: Secondary | ICD-10-CM | POA: Insufficient documentation

## 2021-04-29 DIAGNOSIS — R2689 Other abnormalities of gait and mobility: Secondary | ICD-10-CM | POA: Insufficient documentation

## 2021-04-29 DIAGNOSIS — M25611 Stiffness of right shoulder, not elsewhere classified: Secondary | ICD-10-CM | POA: Diagnosis present

## 2021-04-29 DIAGNOSIS — R29898 Other symptoms and signs involving the musculoskeletal system: Secondary | ICD-10-CM | POA: Insufficient documentation

## 2021-04-29 DIAGNOSIS — R278 Other lack of coordination: Secondary | ICD-10-CM | POA: Insufficient documentation

## 2021-04-29 DIAGNOSIS — M25612 Stiffness of left shoulder, not elsewhere classified: Secondary | ICD-10-CM | POA: Diagnosis present

## 2021-04-29 NOTE — Therapy (Signed)
[facility] [address] [place], [place], [address] Phone: [phone]   Fax:  [phone]  Occupational Therapy Treatment  Patient Details  Name: Zachary Wang. MRN: [id number] Date of Birth: 1941/07/25 Referring Provider (OT): Dr. Wells Guiles Tat   Encounter Date: 04/29/2021   OT End of Session - 04/29/21 1157     Visit Number 1    Number of Visits 11    Date for OT Re-Evaluation 06/10/21    Authorization - Visit Number 1    Progress Note Due on Visit 10    OT Start Time [id number]    OT Stop Time 1011    OT Time Calculation (min) 37 min    Activity Tolerance Patient tolerated treatment well    Behavior During Therapy [facility] for tasks assessed/performed             Past Medical History:  Diagnosis Date   Arthritis    hands   BPH (benign prostatic hyperplasia)    Elevated PSA    Foley catheter in place    History of adenomatous polyp of colon    2014  tubular adenoma   History of malignant melanoma    s/p  excision left forearm and left axill lymph node bx 09-10-2004   Hyperlipidemia    Hypertension    Peripheral vascular disease [facility])    Urinary retention     Past Surgical History:  Procedure Laterality Date   [place]   COLONOSCOPY N/A 05/02/2013   Procedure: COLONOSCOPY;  Surgeon: Garlan Fair, MD;  Location: [facility];  Service: Endoscopy;  Laterality: N/A;   INSERTION OF SUPRAPUBIC CATHETER N/A 09/30/2015   Procedure: INSERTION OF SUPRAPUBIC CATHETER;  Surgeon: Kathie Rhodes, MD;  Location: [facility];  Service: Urology;  Laterality: N/A;   SHOULDER ARTHROSCOPY W/ SUBACROMIAL DECOMPRESSION AND DISTAL CLAVICLE EXCISION Left 05-20-2010   w/  Debridement , Bursectomy, Acromioplasty,  Capsulectomy   TRANSURETHRAL RESECTION OF PROSTATE N/A 09/30/2015   Procedure: TURP (TRANSURETHRAL RESECTION OF PROSTATE WITH GYRUS;  Surgeon: Kathie Rhodes, MD;  Location: [facility];  Service: Urology;  Laterality: N/A;   WIDE EXCISION MALIGNANT MELANOMA LEFT FOREARM/  LEFT AXILLA LYMPH NODE BX  09-10-2004    There were no vitals filed for this visit.   Subjective Assessment - 04/29/21 0932     Subjective  Pt reports cutting leg at [facility]    Pertinent History Parkinson's disease (03/2017 diagnosis).  PMH:  arthritis (limited L wrist flex due to arthritis), hx of malignant melanoma, HDL, HTN, Peripheral vascular disease, hx of L shoudler arthroscopy 2011, hx of bilateral cataract surgery    Patient Stated Goals improve fastening buttons, improve coordination    Currently in Pain? No/denies                [facility] OT Assessment - 04/29/21 0950       Assessment   Medical Diagnosis Parkinson's Disease    Referring Provider (OT) Dr. 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 moves  class      ADL   Eating/Feeding Modified independent    Eating/Feeding Patient Percentage --   difficulty opening containers and bottles   Grooming Modified independent    Upper Body Bathing Modified independent    Lower Body Bathing Modified independent   difficulty washing feet   Upper Body Dressing Increased time    Upper Body Dressing Patient Percentage --   difficulty with buttons   Lower Body Dressing Modified independent   uses shoe horn   Toilet Transfer Modified independent    Toileting - Clothing Manipulation Modified independent    Toileting -  Human resources officer bars;Walk in shower    ADL comments difficulty with bed mobility      IADL   Light Housekeeping Performs light daily tasks such as dishwashing, bed making    Meal Prep Able to complete simple warm meal prep   uses microwave and grills   Programmer, applications own Civil engineer, contracting financial matters independently (budgets, writes checks, pays rent, bills goes to bank), collects and keeps track of income      Mobility   Mobility Status Independent      Vision - History   Visual History --   cataract surgery     Cognition   Overall Cognitive Status Within Functional Limits for tasks assessed      Observation/Other Assessments   Simulated Eating Time (seconds) 10.28 secs    Donning Doffing Jacket Time (seconds) Fastening/unfastening 3 buttons in 52.25 , left hand was not assisting as much    Pension scheme manager Comments 9.28      Posture/Postural Control   Posture/Postural Control Postural limitations    Postural Limitations Rounded Shoulders;Forward head      Coordination   Right 9 Hole Peg Test 32.47    Left 9 Hole Peg Test 51.41    Box and Blocks R 51 blocks L 50 blocks    Tremors LUE>RUE (noted with action)       ROM / Strength   AROM / PROM / Strength --   rigidity     AROM   Overall AROM  Deficits    Overall AROM Comments R shoulder flexion 125, elbow extension -10, L shoulder flexion 120, elbow extension-15, decreased MP active extension.   except old wrist fusion Left wrist     RUE Tone   RUE Tone Within Functional Limits      LUE Tone   LUE Tone Mild                                OT Short Term Goals - 04/29/21 1158       OT SHORT TERM GOAL #1   Title no STG's      OT SHORT TERM GOAL #2   Title --               OT Long Term Goals - 04/29/21 1144       OT LONG TERM GOAL #1   Title Pt will verbalize understanding of  adaptive strategies to incr ease/efficiency with ADLs/IADLs (including buttoning, picking up items from floor,  opening containers)    Time 5    Period Weeks    Status New      OT LONG TERM GOAL #2   Title Pt will report pain in RUE less than or equal to 2/10 with exercise and ADLs.    Time 5    Period Weeks    Status New      OT LONG TERM GOAL #3   Title Pt will improve L hand coordination for ADLs as shown by completing 9-hole peg test in 48 secs or less    Baseline 51.41 secs    Time 5    Period Weeks    Status New      OT LONG TERM GOAL #4   Title Pt will improve bilateral hand coordination as shown by fastening/unfastening 3 buttons in 48 sec or less.    Baseline 52.25    Time 5    Period Weeks    Status New      OT LONG TERM GOAL #5   Title Pt will be independent with updates to PD-specific HEP.    Time 5    Period Weeks    Status New                   Plan - 04/29/21 1152     Clinical Impression Statement Pt is a 80 y.o. male with Parkinson's Disease.  Pt was diagnosed with PD 03/2017.  Pt was previously seen for OT 2021.  PD screen completed 03/05/21  indicated that pt may benefit from occupational therapy evaluation due to decline in coordination.  PMH includes:arthritis, hx of malignant melanoma, HDL, HTN, Peripheral vascular disease, hx of L shoudler arthroscopy 2011, bilateral cataract surgery 10/2019.  Pt presents today with decr coordination, tremors, abnormal posture, decr balance for ADLs/IADLs, rigidity, bradykinesia.  Pt would benefit from occupational therapy for incr ease/efficiency with ADLs/IADLs, to update HEP, and decr risk of future complications related to PD for improved quality of life.    OT Occupational Profile and History Detailed Assessment- Review of Records and additional review of physical, cognitive, psychosocial history related to current functional performance    Occupational performance deficits (Please refer to evaluation for details):  ADL's;IADL's;Leisure    Body Structure / Function / Physical Skills ADL;Dexterity;IADL;Balance;Mobility;Coordination;FMC;Tone;UE functional use;Decreased knowledge of use of DME;GMC;Endurance;Improper spinal/pelvic alignment;Flexibility   bradykinesia   Rehab Potential Good    Clinical Decision Making Several treatment options, min-mod task modification necessary    Comorbidities Affecting Occupational Performance: May have comorbidities impacting occupational performance    Modification or Assistance to Complete Evaluation  Min-Moderate modification of tasks or assist with assess necessary to complete eval    OT Frequency 2x / week    OT Duration --   x 5 weeks plus eval or 11 visits total   OT Treatment/Interventions Self-care/ADL training;Moist Heat;Fluidtherapy;DME and/or AE instruction;Therapeutic activities;Aquatic Therapy;Therapeutic exercise;Functional Mobility Training;Neuromuscular education;Cryotherapy;Energy conservation;Manual Therapy;Patient/family education;Passive range of motion    Plan updated coordination HEP, ADL strategies    OT Home Exercise Plan --    Consulted and Agree with Plan of Care Patient             Patient will benefit from skilled therapeutic intervention in order to improve the following deficits and impairments:   Body Structure / Function / Physical Skills: ADL, Dexterity, IADL, Balance, Mobility, Coordination, FMC, Tone, UE functional use, Decreased knowledge of use of DME, GMC, Endurance, Improper spinal/pelvic alignment, Flexibility (bradykinesia)  Visit Diagnosis: Other lack of coordination - Plan: Ot plan of care cert/re-cert  Other symptoms and signs involving the nervous system - Plan: Ot plan of care cert/re-cert  Other symptoms and signs involving the musculoskeletal system - Plan: Ot plan of care cert/re-cert  Abnormal posture - Plan: Ot plan of care cert/re-cert  Other abnormalities of gait and mobility - Plan: Ot plan of care  cert/re-cert  Unsteadiness on feet - Plan: Ot plan of care cert/re-cert  Tremor - Plan: Ot plan of care cert/re-cert  Stiffness of left shoulder, not elsewhere classified - Plan: Ot plan of care cert/re-cert  Stiffness of right shoulder, not elsewhere classified - Plan: Ot plan of care cert/re-cert    Problem List Patient Active Problem List   Diagnosis Date Noted   Parkinson's disease ([place]) 04/13/2017   BPH (benign prostatic hypertrophy) with urinary retention 09/30/2015    Treyshaun Keatts, OT/L 04/29/2021, 12:05 PM  [facility] [address] [place], [place], [address] Phone: [phone]   Fax:  [phone]  Name: Zachary Wang. MRN: [id number] Date of Birth: [birth year]

## 2021-05-06 ENCOUNTER — Other Ambulatory Visit: Payer: Self-pay

## 2021-05-06 ENCOUNTER — Ambulatory Visit: Payer: No Typology Code available for payment source | Admitting: Occupational Therapy

## 2021-05-06 DIAGNOSIS — R29898 Other symptoms and signs involving the musculoskeletal system: Secondary | ICD-10-CM

## 2021-05-06 DIAGNOSIS — R2681 Unsteadiness on feet: Secondary | ICD-10-CM

## 2021-05-06 DIAGNOSIS — R278 Other lack of coordination: Secondary | ICD-10-CM

## 2021-05-06 DIAGNOSIS — M25611 Stiffness of right shoulder, not elsewhere classified: Secondary | ICD-10-CM

## 2021-05-06 DIAGNOSIS — R29818 Other symptoms and signs involving the nervous system: Secondary | ICD-10-CM

## 2021-05-06 DIAGNOSIS — R293 Abnormal posture: Secondary | ICD-10-CM

## 2021-05-06 DIAGNOSIS — R2689 Other abnormalities of gait and mobility: Secondary | ICD-10-CM

## 2021-05-06 NOTE — Therapy (Signed)
[facility] [address] [place], [place], [address] Phone: [phone]   Fax:  [phone]  Occupational Therapy Treatment  Patient Details  Name: Zachary Wang. MRN: [id number] Date of Birth: 21-Jun-1941 Referring Provider (OT): Dr. Wells Guiles Tat   Encounter Date: 05/06/2021   OT End of Session - 05/06/21 1004     Visit Number 2    Number of Visits 11    Date for OT Re-Evaluation 06/10/21    Authorization Type [facility] await clarification of number of visits    Authorization - Visit Number 2    Progress Note Due on Visit 10    OT Start Time 0850    OT Stop Time 0930    OT Time Calculation (min) 40 min    Activity Tolerance Patient tolerated treatment well    Behavior During Therapy [facility] for tasks assessed/performed             Past Medical History:  Diagnosis Date   Arthritis    hands   BPH (benign prostatic hyperplasia)    Elevated PSA    Foley catheter in place    History of adenomatous polyp of colon    2014  tubular adenoma   History of malignant melanoma    s/p  excision left forearm and left axill lymph node bx 09-10-2004   Hyperlipidemia    Hypertension    Peripheral vascular disease [facility])    Urinary retention     Past Surgical History:  Procedure Laterality Date   ANAL FISSURE REPAIR  1984   COLONOSCOPY N/A 05/02/2013   Procedure: COLONOSCOPY;  Surgeon: Garlan Fair, MD;  Location: [facility];  Service: Endoscopy;  Laterality: N/A;   INSERTION OF SUPRAPUBIC CATHETER N/A 09/30/2015   Procedure: INSERTION OF SUPRAPUBIC CATHETER;  Surgeon: Kathie Rhodes, MD;  Location: [facility];  Service: Urology;  Laterality: N/A;   SHOULDER ARTHROSCOPY W/ SUBACROMIAL DECOMPRESSION AND DISTAL CLAVICLE EXCISION Left 05-20-2010   w/  Debridement , Bursectomy, Acromioplasty,  Capsulectomy   TRANSURETHRAL RESECTION OF PROSTATE N/A 09/30/2015   Procedure: TURP (TRANSURETHRAL RESECTION OF PROSTATE  WITH GYRUS;  Surgeon: Kathie Rhodes, MD;  Location: [facility];  Service: Urology;  Laterality: N/A;   WIDE EXCISION MALIGNANT MELANOMA LEFT FOREARM/  LEFT AXILLA LYMPH NODE BX  09-10-2004    There were no vitals filed for this visit.   Subjective Assessment - 05/06/21 0940     Subjective  Pt reports mild shoulder pain with malpositioning    Pertinent History Parkinson's disease (03/2017 diagnosis).  PMH:  arthritis (limited L wrist flex due to arthritis), hx of malignant melanoma, HDL, HTN, Peripheral vascular disease, hx of L shoudler arthroscopy 2011, hx of bilateral cataract surgery    Patient Stated Goals improve fastening buttons, improve coordination    Currently in Pain? Yes    Pain Score 2     Pain Location Shoulder    Pain Orientation Right    Pain Descriptors / Indicators Aching    Pain Type Acute pain    Pain Onset More than a month ago    Pain Frequency Intermittent    Aggravating Factors  movement    Pain Relieving Factors rest                   Treatment: Supine closed chain shoulder flexion, diagonals and chest press, min v.c for positioning.  OT Treatment/ Education - 05/06/21 0944     Education Details Reviewed PWR! moves in supine for PWR! up, rock and twist due to shoulder tightness, min v.c for positioning and amplitude. Reveiwed coordination HEP(pt has previously issued handout) for flipping and dealing cards, picking up and stacking coins,  rotating ball in hand, tossing ball between hands and PWR! hands basic 4, min v.c    Person(s) Educated Patient    Methods Explanation;Demonstration;Verbal cues;Handout    Comprehension Verbalized understanding;Returned demonstration;Verbal cues required              OT Short Term Goals - 04/29/21 1158       OT SHORT TERM GOAL #1   Title no STG's      OT SHORT TERM GOAL #2   Title --               OT Long Term Goals - 04/29/21 1144       OT LONG TERM  GOAL #1   Title Pt will verbalize understanding of adaptive strategies to incr ease/efficiency with ADLs/IADLs (including buttoning, picking up items from floor, opening containers)    Time 5    Period Weeks    Status New      OT LONG TERM GOAL #2   Title Pt will report pain in RUE less than or equal to 2/10 with exercise and ADLs.    Time 5    Period Weeks    Status New      OT LONG TERM GOAL #3   Title Pt will improve L hand coordination for ADLs as shown by completing 9-hole peg test in 48 secs or less    Baseline 51.41 secs    Time 5    Period Weeks    Status New      OT LONG TERM GOAL #4   Title Pt will improve bilateral hand coordination as shown by fastening/unfastening 3 buttons in 48 sec or less.    Baseline 52.25    Time 5    Period Weeks    Status New      OT LONG TERM GOAL #5   Title Pt will be independent with updates to PD-specific HEP.    Time 5    Period Weeks    Status New                   Plan - 05/06/21 1005     Clinical Impression Statement Pt is progressing towards goals. He reports that his right shoulder felt better following PWr! moves in supine.    OT Occupational Profile and History Detailed Assessment- Review of Records and additional review of physical, cognitive, psychosocial history related to current functional performance    Occupational performance deficits (Please refer to evaluation for details): ADL's;IADL's;Leisure    Body Structure / Function / Physical Skills ADL;Dexterity;IADL;Balance;Mobility;Coordination;FMC;Tone;UE functional use;Decreased knowledge of use of DME;GMC;Endurance;Improper spinal/pelvic alignment;Flexibility   bradykinesia   Rehab Potential Good    Clinical Decision Making Several treatment options, min-mod task modification necessary    Comorbidities Affecting Occupational Performance: May have comorbidities impacting occupational performance    Modification or Assistance to Complete Evaluation  Min-Moderate  modification of tasks or assist with assess necessary to complete eval    OT Frequency 2x / week    OT Duration --   x 5 weeks plus eval or 11 visits total   OT Treatment/Interventions Self-care/ADL training;Moist Heat;Fluidtherapy;DME and/or AE instruction;Therapeutic activities;Aquatic Therapy;Therapeutic exercise;Functional Mobility Training;Neuromuscular education;Cryotherapy;Energy conservation;Manual Therapy;Patient/family  education;Passive range of motion    Plan continue to add to coordiantion HEP, ADL strategies    Consulted and Agree with Plan of Care Patient             Patient will benefit from skilled therapeutic intervention in order to improve the following deficits and impairments:   Body Structure / Function / Physical Skills: ADL, Dexterity, IADL, Balance, Mobility, Coordination, FMC, Tone, UE functional use, Decreased knowledge of use of DME, GMC, Endurance, Improper spinal/pelvic alignment, Flexibility (bradykinesia)       Visit Diagnosis: Other lack of coordination  Other symptoms and signs involving the nervous system  Other symptoms and signs involving the musculoskeletal system  Abnormal posture  Other abnormalities of gait and mobility  Unsteadiness on feet  Stiffness of right shoulder, not elsewhere classified    Problem List Patient Active Problem List   Diagnosis Date Noted   Parkinson's disease ([place]) 04/13/2017   BPH (benign prostatic hypertrophy) with urinary retention 09/30/2015    Bayan Hedstrom, OT/L 05/06/2021, 10:07 AM  [facility] [address] [place], [place], [address] Phone: [phone]   Fax:  [phone]  Name: Joud Ingwersen. MRN: [id number] Date of Birth: Mar 03, 1941

## 2021-05-08 ENCOUNTER — Other Ambulatory Visit: Payer: Self-pay

## 2021-05-08 ENCOUNTER — Encounter: Payer: Self-pay | Admitting: Occupational Therapy

## 2021-05-08 ENCOUNTER — Ambulatory Visit: Payer: No Typology Code available for payment source | Admitting: Occupational Therapy

## 2021-05-08 DIAGNOSIS — R293 Abnormal posture: Secondary | ICD-10-CM

## 2021-05-08 DIAGNOSIS — M25611 Stiffness of right shoulder, not elsewhere classified: Secondary | ICD-10-CM

## 2021-05-08 DIAGNOSIS — R278 Other lack of coordination: Secondary | ICD-10-CM

## 2021-05-08 DIAGNOSIS — R2681 Unsteadiness on feet: Secondary | ICD-10-CM

## 2021-05-08 DIAGNOSIS — R29818 Other symptoms and signs involving the nervous system: Secondary | ICD-10-CM

## 2021-05-08 DIAGNOSIS — R251 Tremor, unspecified: Secondary | ICD-10-CM

## 2021-05-08 DIAGNOSIS — R2689 Other abnormalities of gait and mobility: Secondary | ICD-10-CM

## 2021-05-08 DIAGNOSIS — M25612 Stiffness of left shoulder, not elsewhere classified: Secondary | ICD-10-CM

## 2021-05-08 DIAGNOSIS — R29898 Other symptoms and signs involving the musculoskeletal system: Secondary | ICD-10-CM

## 2021-05-08 NOTE — Patient Instructions (Addendum)
   Coordination Exercises  Perform the following exercises for 20 minutes 1 times per day. Perform with both hand(s). Perform using big movements.  Flipping Cards: Place deck of cards on the table. Flip cards over by opening your hand big to grasp and then turn your palm up big, opening hand fully to release. Deal cards: Hold 1/2 or whole deck in your hand. Use thumb to push card off top of deck with one big push. Flip card between each finger and back Place card on tabletop.  Then flick fingers (extend fingers) powerfully to slide card off table (can have chair/box below table to catch the cards). Rotate ball with fingertips: Pick up with fingers/thumb and move as much as you can with each turn/movement (clockwise and counter-clockwise). Toss ball from one hand to the other: Toss big/high.  Deliberately open with toss and deliberately close hand after catch. Toss ball in the air and catch with the same hand: Toss big/high.  Deliberately open with toss and deliberately close hand after catch. Rotate 2 golf balls in your hand: Both directions if you can. Pick up coins and stack one at a time: Open hand big and pick up with big, intentional movements. Do not drag coin to the edge. (5-10 in a stack) Pick up 5-10 coins one at a time and hold in palm. Then, move coins from palm to fingertips one at time and place in coin bank/container. Pick up 5-10 coins one at a time and hold in palm. Then, move coins from palm to fingertips one at a time to stack. Practice writing: Slow down, write big, and focus on forming each letter.  Stop and stretch hand frequently/if writing gets smaller.   Hold end of small trash/produce bag in fingers and pull bag into palm by extending fingers big

## 2021-05-08 NOTE — Therapy (Signed)
[facility] [address] [place], [place], [address] Phone: [phone]   Fax:  [phone]  Occupational Therapy Treatment  Patient Details  Name: Zachary Wang. MRN: [id number] Date of Birth: [birth year] Referring Provider (OT): Dr. Wells Guiles Tat   Encounter Date: 05/08/2021   OT End of Session - 05/08/21 0807     Visit Number 3    Number of Visits 11    Date for OT Re-Evaluation 06/10/21    Authorization Type [facility] await clarification of number of visits    Authorization Time Period [place] auth period 04/09/21-08/07/21    Authorization - Visit Number 3    Authorization - Number of Visits 10    Progress Note Due on Visit 10    OT Start Time 0803    OT Stop Time 0845    OT Time Calculation (min) 42 min    Activity Tolerance Patient tolerated treatment well    Behavior During Therapy [facility] for tasks assessed/performed             Past Medical History:  Diagnosis Date   Arthritis    hands   BPH (benign prostatic hyperplasia)    Elevated PSA    Foley catheter in place    History of adenomatous polyp of colon    2014  tubular adenoma   History of malignant melanoma    s/p  excision left forearm and left axill lymph node bx 09-10-2004   Hyperlipidemia    Hypertension    Peripheral vascular disease [facility])    Urinary retention     Past Surgical History:  Procedure Laterality Date   [place]   COLONOSCOPY N/A 05/02/2013   Procedure: COLONOSCOPY;  Surgeon: Garlan Fair, MD;  Location: [facility];  Service: Endoscopy;  Laterality: N/A;   INSERTION OF SUPRAPUBIC CATHETER N/A 09/30/2015   Procedure: INSERTION OF SUPRAPUBIC CATHETER;  Surgeon: Kathie Rhodes, MD;  Location: [facility];  Service: Urology;  Laterality: N/A;   SHOULDER ARTHROSCOPY W/ SUBACROMIAL DECOMPRESSION AND DISTAL CLAVICLE EXCISION Left 05-20-2010   w/  Debridement , Bursectomy, Acromioplasty,  Capsulectomy    TRANSURETHRAL RESECTION OF PROSTATE N/A 09/30/2015   Procedure: TURP (TRANSURETHRAL RESECTION OF PROSTATE WITH GYRUS;  Surgeon: Kathie Rhodes, MD;  Location: [place];  Service: Urology;  Laterality: N/A;   WIDE EXCISION MALIGNANT MELANOMA LEFT FOREARM/  LEFT AXILLA LYMPH NODE BX  09-10-2004    There were no vitals filed for this visit.   Subjective Assessment - 05/08/21 0805     Subjective  Pt reports mild shoulder pain with malpositioning, ?after trimming shrubs approx 2 wks ago    Pertinent History Parkinson's disease (03/2017 diagnosis).  PMH:  arthritis (limited L wrist flex due to arthritis), hx of malignant melanoma, HDL, HTN, Peripheral vascular disease, hx of L shoudler arthroscopy 2011, hx of bilateral cataract surgery    Patient Stated Goals improve fastening buttons, improve coordination    Currently in Pain? Yes    Pain Score 4     Pain Location Shoulder    Pain Orientation Right    Pain Descriptors / Indicators Aching    Pain Type Acute pain    Pain Onset More than a month ago    Pain Frequency Intermittent    Aggravating Factors  movement    Pain Relieving Factors rest              In standing, floor>ceiling shoulder flex and  diagonals to each side with ball with BUEs with min cueing for positioning of head and elbow ext.--no pain   PWR up, rock, twist in standing (pt instructed to perform PWR! Up and twist with shoulder lower--below shoulder height), and PWR! Rock in modified quadruped at table with min cueing for positioning and incr movement amplitude, particularly with R hand.--no pain           OT Education - 05/08/21 0818     Education Details Proper positioning of R shoulder, instructed pt to perform PWR! moves in class/at home with shoulders below shoulder height when in abduction.  Reviewed and added to coordination HEP--see pt instructions    Person(s) Educated Patient    Methods Explanation;Demonstration;Verbal cues     Comprehension Verbalized understanding;Returned demonstration              OT Short Term Goals - 04/29/21 1158       OT SHORT TERM GOAL #1   Title no STG's      OT SHORT TERM GOAL #2   Title --               OT Long Term Goals - 04/29/21 1144       OT LONG TERM GOAL #1   Title Pt will verbalize understanding of adaptive strategies to incr ease/efficiency with ADLs/IADLs (including buttoning, picking up items from floor, opening containers)    Time 5    Period Weeks    Status New      OT LONG TERM GOAL #2   Title Pt will report pain in RUE less than or equal to 2/10 with exercise and ADLs.    Time 5    Period Weeks    Status New      OT LONG TERM GOAL #3   Title Pt will improve L hand coordination for ADLs as shown by completing 9-hole peg test in 48 secs or less    Baseline 51.41 secs    Time 5    Period Weeks    Status New      OT LONG TERM GOAL #4   Title Pt will improve bilateral hand coordination as shown by fastening/unfastening 3 buttons in 48 sec or less.    Baseline 52.25    Time 5    Period Weeks    Status New      OT LONG TERM GOAL #5   Title Pt will be independent with updates to PD-specific HEP.    Time 5    Period Weeks    Status New                   Plan - 05/08/21 1062     Clinical Impression Statement Pt is progressing well with decr shoulder pain reported with proper positioning, posture, and stretching.    OT Occupational Profile and History Detailed Assessment- Review of Records and additional review of physical, cognitive, psychosocial history related to current functional performance    Occupational performance deficits (Please refer to evaluation for details): ADL's;IADL's;Leisure    Body Structure / Function / Physical Skills ADL;Dexterity;IADL;Balance;Mobility;Coordination;FMC;Tone;UE functional use;Decreased knowledge of use of DME;GMC;Endurance;Improper spinal/pelvic alignment;Flexibility   bradykinesia   Rehab  Potential Good    Clinical Decision Making Several treatment options, min-mod task modification necessary    Comorbidities Affecting Occupational Performance: May have comorbidities impacting occupational performance    Modification or Assistance to Complete Evaluation  Min-Moderate modification of tasks or assist with assess necessary to complete eval  OT Frequency 2x / week    OT Duration --   x 5 weeks plus eval or 11 visits total   OT Treatment/Interventions Self-care/ADL training;Moist Heat;Fluidtherapy;DME and/or AE instruction;Therapeutic activities;Aquatic Therapy;Therapeutic exercise;Functional Mobility Training;Neuromuscular education;Cryotherapy;Energy conservation;Manual Therapy;Patient/family education;Passive range of motion    Plan continue to add to coordiantion HEP, ADL strategies    Consulted and Agree with Plan of Care Patient             Patient will benefit from skilled therapeutic intervention in order to improve the following deficits and impairments:   Body Structure / Function / Physical Skills: ADL, Dexterity, IADL, Balance, Mobility, Coordination, FMC, Tone, UE functional use, Decreased knowledge of use of DME, GMC, Endurance, Improper spinal/pelvic alignment, Flexibility (bradykinesia)       Visit Diagnosis: Other symptoms and signs involving the nervous system  Other lack of coordination  Abnormal posture  Other symptoms and signs involving the musculoskeletal system  Other abnormalities of gait and mobility  Unsteadiness on feet  Stiffness of right shoulder, not elsewhere classified  Tremor  Stiffness of left shoulder, not elsewhere classified    Problem List Patient Active Problem List   Diagnosis Date Noted   Parkinson's disease ([place]) 04/13/2017   BPH (benign prostatic hypertrophy) with urinary retention 09/30/2015    [facility], OT/L 05/08/2021, 8:45 AM  [facility] [address] [place], [place], [address] Phone: [phone]   Fax:  [phone]  Name: Zachary Wang. MRN: [id number] Date of Birth: Dec 30, 1940  Zachary Wang, OTR/L [facility] [address] [place], [place]  [address] [phone] phone [phone] 05/08/21 8:45 AM

## 2021-05-13 ENCOUNTER — Other Ambulatory Visit: Payer: Self-pay

## 2021-05-13 ENCOUNTER — Ambulatory Visit: Payer: No Typology Code available for payment source | Admitting: Occupational Therapy

## 2021-05-13 DIAGNOSIS — M25611 Stiffness of right shoulder, not elsewhere classified: Secondary | ICD-10-CM

## 2021-05-13 DIAGNOSIS — R2689 Other abnormalities of gait and mobility: Secondary | ICD-10-CM

## 2021-05-13 DIAGNOSIS — R293 Abnormal posture: Secondary | ICD-10-CM

## 2021-05-13 DIAGNOSIS — R278 Other lack of coordination: Secondary | ICD-10-CM | POA: Diagnosis not present

## 2021-05-13 DIAGNOSIS — R29898 Other symptoms and signs involving the musculoskeletal system: Secondary | ICD-10-CM

## 2021-05-13 DIAGNOSIS — R29818 Other symptoms and signs involving the nervous system: Secondary | ICD-10-CM

## 2021-05-13 DIAGNOSIS — M25612 Stiffness of left shoulder, not elsewhere classified: Secondary | ICD-10-CM

## 2021-05-13 DIAGNOSIS — R2681 Unsteadiness on feet: Secondary | ICD-10-CM

## 2021-05-13 NOTE — Therapy (Signed)
[facility] [address] [place], [place], [address] Phone: [phone]   Fax:  [phone]  Occupational Therapy Treatment  Patient Details  Name: Zachary Wang. MRN: [id number] Date of Birth: 19-Sep-1940 Referring Provider (OT): Dr. Wells Guiles Tat   Encounter Date: 05/13/2021   OT End of Session - 05/13/21 0907     Visit Number 4    Number of Visits 11    Authorization Type VA await clarification of number of visits    Authorization Time Period VA auth period 04/09/21-08/07/21    Authorization - Visit Number 4    Authorization - Number of Visits 10    Progress Note Due on Visit 10    OT Start Time 0850    OT Stop Time 0930    OT Time Calculation (min) 40 min             Past Medical History:  Diagnosis Date   Arthritis    hands   BPH (benign prostatic hyperplasia)    Elevated PSA    Foley catheter in place    History of adenomatous polyp of colon    2014  tubular adenoma   History of malignant melanoma    s/p  excision left forearm and left axill lymph node bx 09-10-2004   Hyperlipidemia    Hypertension    Peripheral vascular disease [facility])    Urinary retention     Past Surgical History:  Procedure Laterality Date   ANAL FISSURE REPAIR  1984   COLONOSCOPY N/A 05/02/2013   Procedure: COLONOSCOPY;  Surgeon: Garlan Fair, MD;  [facility];  Service: Endoscopy;  Laterality: N/A;   INSERTION OF SUPRAPUBIC CATHETER N/A 09/30/2015   Procedure: INSERTION OF SUPRAPUBIC CATHETER;  Surgeon: Kathie Rhodes, MD;  Location: [facility];  Service: Urology;  Laterality: N/A;   SHOULDER ARTHROSCOPY W/ SUBACROMIAL DECOMPRESSION AND DISTAL CLAVICLE EXCISION Left 05-20-2010   w/  Debridement , Bursectomy, Acromioplasty,  Capsulectomy   TRANSURETHRAL RESECTION OF PROSTATE N/A 09/30/2015   Procedure: TURP (TRANSURETHRAL RESECTION OF PROSTATE WITH GYRUS;  Surgeon: Kathie Rhodes, MD;  Location:  [place];  Service: Urology;  Laterality: N/A;   WIDE EXCISION MALIGNANT MELANOMA LEFT FOREARM/  LEFT AXILLA LYMPH NODE BX  09-10-2004    There were no vitals filed for this visit.   Subjective Assessment - 05/13/21 0904     Subjective  mild shoulder pain with  malpositioning    Pertinent History Parkinson's disease (03/2017 diagnosis).  PMH:  arthritis (limited L wrist flex due to arthritis), hx of malignant melanoma, HDL, HTN, Peripheral vascular disease, hx of L shoudler arthroscopy 2011, hx of bilateral cataract surgery    Patient Stated Goals improve fastening buttons, improve coordination    Currently in Pain? Yes    Pain Score 3     Pain Location Shoulder    Pain Orientation Right    Pain Descriptors / Indicators Aching    Pain Type Acute pain    Pain Onset More than a month ago    Pain Frequency Intermittent    Aggravating Factors  movement    Pain Relieving Factors rest                     Treatment: standing to perform closed chain diagonals 10 reps each direction, min v.c for positioning             OT Education - 05/13/21 1010  Education Details Reveiwed fastening buttons with big movements, min v.c then improved performance, PWR! up, rock and twist in supine, PWR! rock modified quadraped, min v.c , Reveiwed the following activities from HEP for coordination:flipping and dealing cards, stacking and manipulating coins, rotating golf balls, and crumpling bag all with big movments, min v.c for amplitude.    Person(s) Educated Patient    Methods Explanation;Demonstration;Verbal cues    Comprehension Verbalized understanding;Returned demonstration              OT Short Term Goals - 04/29/21 1158       OT SHORT TERM GOAL #1   Title no STG's      OT SHORT TERM GOAL #2   Title --               OT Long Term Goals - 04/29/21 1144       OT LONG TERM GOAL #1   Title Pt will verbalize understanding of adaptive  strategies to incr ease/efficiency with ADLs/IADLs (including buttoning, picking up items from floor, opening containers)    Time 5    Period Weeks    Status New      OT LONG TERM GOAL #2   Title Pt will report pain in RUE less than or equal to 2/10 with exercise and ADLs.    Time 5    Period Weeks    Status New      OT LONG TERM GOAL #3   Title Pt will improve L hand coordination for ADLs as shown by completing 9-hole peg test in 48 secs or less    Baseline 51.41 secs    Time 5    Period Weeks    Status New      OT LONG TERM GOAL #4   Title Pt will improve bilateral hand coordination as shown by fastening/unfastening 3 buttons in 48 sec or less.    Baseline 52.25    Time 5    Period Weeks    Status New      OT LONG TERM GOAL #5   Title Pt will be independent with updates to PD-specific HEP.    Time 5    Period Weeks    Status New                   Plan - 05/13/21 1009     Clinical Impression Statement Pt is progressing towards goals. Pt reports his shoulder felt better today after perfroming exercises.    OT Occupational Profile and History Detailed Assessment- Review of Records and additional review of physical, cognitive, psychosocial history related to current functional performance    Occupational performance deficits (Please refer to evaluation for details): ADL's;IADL's;Leisure    Body Structure / Function / Physical Skills ADL;Dexterity;IADL;Balance;Mobility;Coordination;FMC;Tone;UE functional use;Decreased knowledge of use of DME;GMC;Endurance;Improper spinal/pelvic alignment;Flexibility   bradykinesia   Rehab Potential Good    Clinical Decision Making Several treatment options, min-mod task modification necessary    Comorbidities Affecting Occupational Performance: May have comorbidities impacting occupational performance    Modification or Assistance to Complete Evaluation  Min-Moderate modification of tasks or assist with assess necessary to complete  eval    OT Frequency 2x / week    OT Duration --   x 5 weeks plus eval or 11 visits total   OT Treatment/Interventions Self-care/ADL training;Moist Heat;Fluidtherapy;DME and/or AE instruction;Therapeutic activities;Aquatic Therapy;Therapeutic exercise;Functional Mobility Training;Neuromuscular education;Cryotherapy;Energy conservation;Manual Therapy;Patient/family education;Passive range of motion    Plan ADL strategies    Consulted and  Agree with Plan of Care Patient             Patient will benefit from skilled therapeutic intervention in order to improve the following deficits and impairments:   Body Structure / Function / Physical Skills: ADL, Dexterity, IADL, Balance, Mobility, Coordination, FMC, Tone, UE functional use, Decreased knowledge of use of DME, GMC, Endurance, Improper spinal/pelvic alignment, Flexibility (bradykinesia)       Visit Diagnosis: Other lack of coordination  Other symptoms and signs involving the nervous system  Abnormal posture  Other symptoms and signs involving the musculoskeletal system  Other abnormalities of gait and mobility  Unsteadiness on feet  Stiffness of right shoulder, not elsewhere classified  Stiffness of left shoulder, not elsewhere classified    Problem List Patient Active Problem List   Diagnosis Date Noted   Parkinson's disease ([place]) 04/13/2017   BPH (benign prostatic hypertrophy) with urinary retention 09/30/2015    Wallie Lagrand, OT/L 05/13/2021, 10:14 AM  [facility] [address] [place], [place], [address] Phone: [phone]   Fax:  [phone]  Name: Zachary Wang. MRN: [id number] Date of Birth: [birth year]

## 2021-05-15 ENCOUNTER — Ambulatory Visit: Payer: No Typology Code available for payment source | Admitting: Occupational Therapy

## 2021-05-15 ENCOUNTER — Other Ambulatory Visit: Payer: Self-pay

## 2021-05-15 DIAGNOSIS — R278 Other lack of coordination: Secondary | ICD-10-CM

## 2021-05-15 DIAGNOSIS — M25612 Stiffness of left shoulder, not elsewhere classified: Secondary | ICD-10-CM

## 2021-05-15 DIAGNOSIS — R2681 Unsteadiness on feet: Secondary | ICD-10-CM

## 2021-05-15 DIAGNOSIS — R293 Abnormal posture: Secondary | ICD-10-CM

## 2021-05-15 DIAGNOSIS — R2689 Other abnormalities of gait and mobility: Secondary | ICD-10-CM

## 2021-05-15 DIAGNOSIS — R29898 Other symptoms and signs involving the musculoskeletal system: Secondary | ICD-10-CM

## 2021-05-15 DIAGNOSIS — R29818 Other symptoms and signs involving the nervous system: Secondary | ICD-10-CM

## 2021-05-15 DIAGNOSIS — M25611 Stiffness of right shoulder, not elsewhere classified: Secondary | ICD-10-CM

## 2021-05-15 NOTE — Patient Instructions (Signed)
Modifications to PWR! Moves  Standing -place right hand on your hip for PWR! Rock Keep arms low for [employer] and rotate body  in standing, seated and laying down For [employer] rock in seated and supine- (laying on back), keep your right arm lower than shoulder height  Perform quadraped(on all 4's) exercises on the back of chair standing and exercises on elbows over ball  Overall- avoid overhead or high reach out to the side with right arm, use your body to assist with twist and rock

## 2021-05-15 NOTE — Therapy (Signed)
[facility] [address] [place], [place], [address] Phone: [phone]   Fax:  [phone]  Occupational Therapy Treatment  Patient Details  Name: Zachary Wang. MRN: [id number] Date of Birth: 1941-06-21 Referring Provider (OT): Dr. Wells Guiles Tat   Encounter Date: 05/15/2021   OT End of Session - 05/15/21 0928     Visit Number 5    Number of Visits 11    Date for OT Re-Evaluation 06/10/21    Authorization Type [facility] await clarification of number of visits    Authorization - Visit Number 5    Authorization - Number of Visits 10    Progress Note Due on Visit 10    OT Start Time 0849    OT Stop Time 0930    OT Time Calculation (min) 41 min             Past Medical History:  Diagnosis Date   Arthritis    hands   BPH (benign prostatic hyperplasia)    Elevated PSA    Foley catheter in place    History of adenomatous polyp of colon    2014  tubular adenoma   History of malignant melanoma    s/p  excision left forearm and left axill lymph node bx 09-10-2004   Hyperlipidemia    Hypertension    Peripheral vascular disease [facility])    Urinary retention     Past Surgical History:  Procedure Laterality Date   ANAL FISSURE REPAIR  1984   COLONOSCOPY N/A 05/02/2013   Procedure: COLONOSCOPY;  Surgeon: Garlan Fair, MD;  [facility];  Service: Endoscopy;  Laterality: N/A;   INSERTION OF SUPRAPUBIC CATHETER N/A 09/30/2015   Procedure: INSERTION OF SUPRAPUBIC CATHETER;  Surgeon: Kathie Rhodes, MD;  [facility];  Service: Urology;  Laterality: N/A;   SHOULDER ARTHROSCOPY W/ SUBACROMIAL DECOMPRESSION AND DISTAL CLAVICLE EXCISION Left 05-20-2010   w/  Debridement , Bursectomy, Acromioplasty,  Capsulectomy   TRANSURETHRAL RESECTION OF PROSTATE N/A 09/30/2015   Procedure: TURP (TRANSURETHRAL RESECTION OF PROSTATE WITH GYRUS;  Surgeon: Kathie Rhodes, MD;  Location: [facility];  Service: Urology;  Laterality: N/A;   WIDE EXCISION MALIGNANT MELANOMA LEFT FOREARM/  LEFT AXILLA LYMPH NODE BX  09-10-2004    There were no vitals filed for this visit.   Subjective Assessment - 05/15/21 0950     Subjective  shoulder pain after PWR! moves class    Pertinent History Parkinson's disease (03/2017 diagnosis).  PMH:  arthritis (limited L wrist flex due to arthritis), hx of malignant melanoma, HDL, HTN, Peripheral vascular disease, hx of L shoudler arthroscopy 2011, hx of bilateral cataract surgery    Patient Stated Goals improve fastening buttons, improve coordination    Currently in Pain? Yes    Pain Score 3     Pain Location Shoulder    Pain Orientation Right    Pain Descriptors / Indicators Aching    Pain Type Acute pain    Pain Onset More than a month ago    Aggravating Factors  exercise class with weightbearing/ overhead reach    Pain Relieving Factors rest, reposistioning                                  OT Education - 05/15/21 0948     Education Details Pt reports shoulder pain today after PWR! moves class, Therapist went through  all the PWR! moves exercises that may provoke shoulder pain : in standing, seated, supine, prone and quadraped and provided modifications to reduce or eliminate pain, handout provided, Flipping and dealing cards with big movmeents followed by flicking cards for finger extension.    Person(s) Educated Patient    Methods Explanation;Demonstration;Verbal cues;Handout    Comprehension Verbalized understanding;Returned demonstration;Verbal cues required              OT Short Term Goals - 04/29/21 1158       OT SHORT TERM GOAL #1   Title no STG's      OT SHORT TERM GOAL #2   Title --               OT Long Term Goals - 05/15/21 0954       OT LONG TERM GOAL #1   Title Pt will verbalize understanding of adaptive strategies to incr ease/efficiency with ADLs/IADLs (including buttoning, picking  up items from floor, opening containers)    Time 5    Period Weeks    Status On-going      OT LONG TERM GOAL #2   Title Pt will report pain in RUE less than or equal to 2/10 with exercise and ADLs.    Time 5    Period Weeks    Status On-going      OT LONG TERM GOAL #3   Title Pt will improve L hand coordination for ADLs as shown by completing 9-hole peg test in 48 secs or less    Baseline 51.41 secs    Time 5    Period Weeks    Status On-going      OT LONG TERM GOAL #4   Title Pt will improve bilateral hand coordination as shown by fastening/unfastening 3 buttons in 48 sec or less.    Baseline 52.25    Time 5    Period Weeks    Status New      OT LONG TERM GOAL #5   Title Pt will be independent with updates to PD-specific HEP.    Time 5    Period Weeks    Status On-going                   Plan - 05/15/21 0951     Clinical Impression Statement Pt is progressing towards goals however he remains limited by shoulder pain with ovehead reach.    OT Occupational Profile and History Detailed Assessment- Review of Records and additional review of physical, cognitive, psychosocial history related to current functional performance    Occupational performance deficits (Please refer to evaluation for details): ADL's;IADL's;Leisure    Body Structure / Function / Physical Skills ADL;Dexterity;IADL;Balance;Mobility;Coordination;FMC;Tone;UE functional use;Decreased knowledge of use of DME;GMC;Endurance;Improper spinal/pelvic alignment;Flexibility   bradykinesia   Rehab Potential Good    Clinical Decision Making Several treatment options, min-mod task modification necessary    Comorbidities Affecting Occupational Performance: May have comorbidities impacting occupational performance    Modification or Assistance to Complete Evaluation  Min-Moderate modification of tasks or assist with assess necessary to complete eval    OT Frequency 2x / week    OT Duration --   x 5 weeks plus  eval or 11 visits total   OT Treatment/Interventions Self-care/ADL training;Moist Heat;Fluidtherapy;DME and/or AE instruction;Therapeutic activities;Aquatic Therapy;Therapeutic exercise;Functional Mobility Training;Neuromuscular education;Cryotherapy;Energy conservation;Manual Therapy;Patient/family education;Passive range of motion    Plan consider [place] to shoulder pain relief , check to see see how shoulder pain is, anticipate d/c next week  or the following week dependent upon shoulder pain    Consulted and Agree with Plan of Care Patient             Patient will benefit from skilled therapeutic intervention in order to improve the following deficits and impairments:   Body Structure / Function / Physical Skills: ADL, Dexterity, IADL, Balance, Mobility, Coordination, FMC, Tone, UE functional use, Decreased knowledge of use of DME, GMC, Endurance, Improper spinal/pelvic alignment, Flexibility (bradykinesia)       Visit Diagnosis: Other lack of coordination  Other symptoms and signs involving the nervous system  Abnormal posture  Other symptoms and signs involving the musculoskeletal system  Other abnormalities of gait and mobility  Unsteadiness on feet  Stiffness of right shoulder, not elsewhere classified  Stiffness of left shoulder, not elsewhere classified    Problem List Patient Active Problem List   Diagnosis Date Noted   Parkinson's disease ([place]) 04/13/2017   BPH (benign prostatic hypertrophy) with urinary retention 09/30/2015    Sterling Mondo, OT/L 05/15/2021, 9:55 AM  [facility] [address] [place], [place], [address] Phone: [phone]   Fax:  [phone]  Name: Zachary Wang. MRN: [id number] Date of Birth: [birth year]

## 2021-05-20 ENCOUNTER — Ambulatory Visit: Payer: No Typology Code available for payment source | Attending: Internal Medicine | Admitting: Occupational Therapy

## 2021-05-20 ENCOUNTER — Encounter: Payer: Self-pay | Admitting: Occupational Therapy

## 2021-05-20 ENCOUNTER — Other Ambulatory Visit: Payer: Self-pay

## 2021-05-20 DIAGNOSIS — R293 Abnormal posture: Secondary | ICD-10-CM | POA: Diagnosis present

## 2021-05-20 DIAGNOSIS — R29898 Other symptoms and signs involving the musculoskeletal system: Secondary | ICD-10-CM | POA: Insufficient documentation

## 2021-05-20 DIAGNOSIS — R278 Other lack of coordination: Secondary | ICD-10-CM | POA: Insufficient documentation

## 2021-05-20 DIAGNOSIS — M25612 Stiffness of left shoulder, not elsewhere classified: Secondary | ICD-10-CM | POA: Diagnosis present

## 2021-05-20 DIAGNOSIS — R29818 Other symptoms and signs involving the nervous system: Secondary | ICD-10-CM | POA: Diagnosis present

## 2021-05-20 DIAGNOSIS — M25611 Stiffness of right shoulder, not elsewhere classified: Secondary | ICD-10-CM | POA: Diagnosis present

## 2021-05-20 DIAGNOSIS — R2681 Unsteadiness on feet: Secondary | ICD-10-CM | POA: Diagnosis present

## 2021-05-20 DIAGNOSIS — R2689 Other abnormalities of gait and mobility: Secondary | ICD-10-CM | POA: Diagnosis present

## 2021-05-20 DIAGNOSIS — R251 Tremor, unspecified: Secondary | ICD-10-CM | POA: Insufficient documentation

## 2021-05-20 NOTE — Therapy (Signed)
[facility] [address] [place], [place], [address] Phone: [phone]   Fax:  [phone]  Occupational Therapy Treatment  Patient Details  Name: Zachary Wang. MRN: [id number] Date of Birth: Apr 26, 1941 Referring Provider (OT): Dr. Wells Guiles Tat   Encounter Date: 05/20/2021   OT End of Session - 05/20/21 0829     Visit Number 6    Number of Visits 11    Date for OT Re-Evaluation 06/10/21    Authorization Type VA await clarification of number of visits    Authorization - Visit Number 6    Authorization - Number of Visits 10    Progress Note Due on Visit 10    OT Start Time 0803    OT Stop Time 0845    OT Time Calculation (min) 42 min    Activity Tolerance Patient tolerated treatment well    Behavior During Therapy [facility] for tasks assessed/performed             Past Medical History:  Diagnosis Date   Arthritis    hands   BPH (benign prostatic hyperplasia)    Elevated PSA    Foley catheter in place    History of adenomatous polyp of colon    2014  tubular adenoma   History of malignant melanoma    s/p  excision left forearm and left axill lymph node bx 09-10-2004   Hyperlipidemia    Hypertension    Peripheral vascular disease [facility])    Urinary retention     Past Surgical History:  Procedure Laterality Date   ANAL FISSURE REPAIR  1984   COLONOSCOPY N/A 05/02/2013   Procedure: COLONOSCOPY;  Surgeon: Garlan Fair, MD;  Location: [facility];  Service: Endoscopy;  Laterality: N/A;   INSERTION OF SUPRAPUBIC CATHETER N/A 09/30/2015   Procedure: INSERTION OF SUPRAPUBIC CATHETER;  Surgeon: Kathie Rhodes, MD;  Location: [facility];  Service: Urology;  Laterality: N/A;   SHOULDER ARTHROSCOPY W/ SUBACROMIAL DECOMPRESSION AND DISTAL CLAVICLE EXCISION Left 05-20-2010   w/  Debridement , Bursectomy, Acromioplasty,  Capsulectomy   TRANSURETHRAL RESECTION OF PROSTATE N/A 09/30/2015   Procedure:  TURP (TRANSURETHRAL RESECTION OF PROSTATE WITH GYRUS;  Surgeon: Kathie Rhodes, MD;  Location: [place];  Service: Urology;  Laterality: N/A;   WIDE EXCISION MALIGNANT MELANOMA LEFT FOREARM/  LEFT AXILLA LYMPH NODE BX  09-10-2004    There were no vitals filed for this visit.   Subjective Assessment - 05/20/21 0804     Subjective  Pt reports continued shoulder pain with certain movements    Pertinent History Parkinson's disease (03/2017 diagnosis).  PMH:  arthritis (limited L wrist flex due to arthritis), hx of malignant melanoma, HDL, HTN, Peripheral vascular disease, hx of L shoudler arthroscopy 2011, hx of bilateral cataract surgery    Patient Stated Goals improve fastening buttons, improve coordination    Currently in Pain? Yes    Pain Score 6     Pain Location Shoulder    Pain Orientation Right    Pain Descriptors / Indicators Sharp    Pain Onset More than a month ago    Pain Frequency Intermittent    Aggravating Factors  weightbearing and overhead reach    Pain Relieving Factors rest, repositioning              Ultrasound x41min to R lateral upper arm 68mhz, 1.1wts/cm2, 50% pulsed with no adverse reactions for pain.    Standing, shoulder flex with BUEs  with ball with min cueing for elbow ext and posture--no pain.  Standing, functional reaching in ER and forward flex with lateral wt. Shift and trunk rotation for incr associated trunk movements with each UE--no pain, to flip cards with min cueing for large amplitude.  Sitting, functional reaching overhead with RUE with min cueing for R shoulder positioning and posture.  Placing small pegs in pegboard with each finger and thumb and use of PWR! Hands with mod difficulty, then manipulating in hand to translate with each hand with min-mod difficulty/cues.  Reviewed proper positioning of R shoulder with reaching including posture/midline alignment/head position.       OT Short Term Goals - 04/29/21 1158        OT SHORT TERM GOAL #1   Title no STG's      OT SHORT TERM GOAL #2   Title --               OT Long Term Goals - 05/15/21 0954       OT LONG TERM GOAL #1   Title Pt will verbalize understanding of adaptive strategies to incr ease/efficiency with ADLs/IADLs (including buttoning, picking up items from floor, opening containers)    Time 5    Period Weeks    Status On-going      OT LONG TERM GOAL #2   Title Pt will report pain in RUE less than or equal to 2/10 with exercise and ADLs.    Time 5    Period Weeks    Status On-going      OT LONG TERM GOAL #3   Title Pt will improve L hand coordination for ADLs as shown by completing 9-hole peg test in 48 secs or less    Baseline 51.41 secs    Time 5    Period Weeks    Status On-going      OT LONG TERM GOAL #4   Title Pt will improve bilateral hand coordination as shown by fastening/unfastening 3 buttons in 48 sec or less.    Baseline 52.25    Time 5    Period Weeks    Status New      OT LONG TERM GOAL #5   Title Pt will be independent with updates to PD-specific HEP.    Time 5    Period Weeks    Status On-going                   Plan - 05/20/21 0830     Clinical Impression Statement Pt is progressing towards goals.  He remains limited by shoulder pain with ovehead reach, but improved with cueing for posture and proper positioning.    OT Occupational Profile and History Detailed Assessment- Review of Records and additional review of physical, cognitive, psychosocial history related to current functional performance    Occupational performance deficits (Please refer to evaluation for details): ADL's;IADL's;Leisure    Body Structure / Function / Physical Skills ADL;Dexterity;IADL;Balance;Mobility;Coordination;FMC;Tone;UE functional use;Decreased knowledge of use of DME;GMC;Endurance;Improper spinal/pelvic alignment;Flexibility   bradykinesia   Rehab Potential Good    Clinical Decision Making Several treatment  options, min-mod task modification necessary    Comorbidities Affecting Occupational Performance: May have comorbidities impacting occupational performance    Modification or Assistance to Complete Evaluation  Min-Moderate modification of tasks or assist with assess necessary to complete eval    OT Frequency 2x / week    OT Duration --   x 5 weeks plus eval or 11 visits total  OT Treatment/Interventions Self-care/ADL training;Moist Heat;Fluidtherapy;DME and/or AE instruction;Therapeutic activities;Aquatic Therapy;Therapeutic exercise;Functional Mobility Training;Neuromuscular education;Cryotherapy;Energy conservation;Manual Therapy;Patient/family education;Passive range of motion    Plan ultrasound, kinesiotaping for R shoulder pain, begin checking goals    Consulted and Agree with Plan of Care Patient             Patient will benefit from skilled therapeutic intervention in order to improve the following deficits and impairments:   Body Structure / Function / Physical Skills: ADL, Dexterity, IADL, Balance, Mobility, Coordination, FMC, Tone, UE functional use, Decreased knowledge of use of DME, GMC, Endurance, Improper spinal/pelvic alignment, Flexibility (bradykinesia)       Visit Diagnosis: Other lack of coordination  Other symptoms and signs involving the nervous system  Abnormal posture  Other symptoms and signs involving the musculoskeletal system    Problem List Patient Active Problem List   Diagnosis Date Noted   Parkinson's disease ([place]) 04/13/2017   BPH (benign prostatic hypertrophy) with urinary retention 09/30/2015    [facility], OT/L 05/20/2021, 1:24 PM  [facility] [address] [place], [place], [address] Phone: [phone]   Fax:  [phone]  Name: Zachary Wang. MRN: [id number] Date of Birth: [birth year]  Vianne Bulls, OTR/L [facility] [address]  [place], [place]  [address] [phone] phone [phone] 05/20/21 1:24 PM

## 2021-05-21 ENCOUNTER — Ambulatory Visit: Payer: No Typology Code available for payment source | Admitting: Occupational Therapy

## 2021-05-21 DIAGNOSIS — M25611 Stiffness of right shoulder, not elsewhere classified: Secondary | ICD-10-CM

## 2021-05-21 DIAGNOSIS — M25612 Stiffness of left shoulder, not elsewhere classified: Secondary | ICD-10-CM

## 2021-05-21 DIAGNOSIS — R2681 Unsteadiness on feet: Secondary | ICD-10-CM

## 2021-05-21 DIAGNOSIS — R29818 Other symptoms and signs involving the nervous system: Secondary | ICD-10-CM

## 2021-05-21 DIAGNOSIS — R293 Abnormal posture: Secondary | ICD-10-CM

## 2021-05-21 DIAGNOSIS — R2689 Other abnormalities of gait and mobility: Secondary | ICD-10-CM

## 2021-05-21 DIAGNOSIS — R29898 Other symptoms and signs involving the musculoskeletal system: Secondary | ICD-10-CM

## 2021-05-21 DIAGNOSIS — R278 Other lack of coordination: Secondary | ICD-10-CM

## 2021-05-21 NOTE — Therapy (Signed)
[facility] [address] [place], [place], [address] Phone: [phone]   Fax:  [phone]  Occupational Therapy Treatment  Patient Details  Name: Zachary Wang. MRN: [id number] Date of Birth: Dec 24, 1940 Referring Provider (OT): Dr. Wells Guiles Tat   Encounter Date: 05/21/2021   OT End of Session - 05/21/21 1046     Visit Number 7    Number of Visits 11    Date for OT Re-Evaluation 06/10/21    Authorization Type VA await clarification of number of visits    Authorization Time Period [place] auth period 04/09/21-08/07/21    Authorization - Visit Number 7    Authorization - Number of Visits 10    Progress Note Due on Visit 10    OT Start Time 0803    OT Stop Time 0845    OT Time Calculation (min) 42 min             Past Medical History:  Diagnosis Date   Arthritis    hands   BPH (benign prostatic hyperplasia)    Elevated PSA    Foley catheter in place    History of adenomatous polyp of colon    2014  tubular adenoma   History of malignant melanoma    s/p  excision left forearm and left axill lymph node bx 09-10-2004   Hyperlipidemia    Hypertension    Peripheral vascular disease [facility])    Urinary retention     Past Surgical History:  Procedure Laterality Date   [place]   COLONOSCOPY N/A 05/02/2013   Procedure: COLONOSCOPY;  Surgeon: Garlan Fair, MD;  Location: [facility];  Service: Endoscopy;  Laterality: N/A;   INSERTION OF SUPRAPUBIC CATHETER N/A 09/30/2015   Procedure: INSERTION OF SUPRAPUBIC CATHETER;  Surgeon: Kathie Rhodes, MD;  Location: [facility];  Service: Urology;  Laterality: N/A;   SHOULDER ARTHROSCOPY W/ SUBACROMIAL DECOMPRESSION AND DISTAL CLAVICLE EXCISION Left 05-20-2010   w/  Debridement , Bursectomy, Acromioplasty,  Capsulectomy   TRANSURETHRAL RESECTION OF PROSTATE N/A 09/30/2015   Procedure: TURP (TRANSURETHRAL RESECTION OF PROSTATE WITH GYRUS;   Surgeon: Kathie Rhodes, MD;  Location: [place];  Service: Urology;  Laterality: N/A;   WIDE EXCISION MALIGNANT MELANOMA LEFT FOREARM/  LEFT AXILLA LYMPH NODE BX  09-10-2004    There were no vitals filed for this visit.   Subjective Assessment - 05/21/21 1052     Subjective  Pt reports continued shoulder pain with certain movements    Pertinent History Parkinson's disease (03/2017 diagnosis).  PMH:  arthritis (limited L wrist flex due to arthritis), hx of malignant melanoma, HDL, HTN, Peripheral vascular disease, hx of L shoudler arthroscopy 2011, hx of bilateral cataract surgery    Patient Stated Goals improve fastening buttons, improve coordination    Currently in Pain? Yes    Pain Score 2     Pain Location Shoulder    Pain Orientation Right    Pain Descriptors / Indicators Aching    Pain Type Acute pain    Pain Onset More than a month ago    Pain Frequency Intermittent    Aggravating Factors  heavy weightbearing, overhead reach    Pain Relieving Factors rest, repositioning, UE                        Treatment: Ultrasound x2min to R lateral upper arm 16mhz, 1.1wts/cm2, 50% pulsed with no adverse reactions  for pain.    Standing, shoulder diagonals  with BUEs with ball with min cueing for elbow ext and posture--no pain.  Kinesiotape applied to right lateral shoulder for pain relief, pt was instructed in wear and care         OT Education - 05/21/21 1049     Education Details Kinesiotape waear and precautions, PWR! up and rock in supine, PWR! up and rock modified quadraped, PWR! twist in standing, min v.c for modifications to minimize shoulder pain and for posture.    Person(s) Educated Patient    Methods Explanation;Demonstration;Verbal cues;Handout    Comprehension Verbalized understanding;Returned demonstration;Verbal cues required              OT Short Term Goals - 04/29/21 1158       OT SHORT TERM GOAL #1   Title no STG's       OT SHORT TERM GOAL #2   Title --               OT Long Term Goals - 05/15/21 0954       OT LONG TERM GOAL #1   Title Pt will verbalize understanding of adaptive strategies to incr ease/efficiency with ADLs/IADLs (including buttoning, picking up items from floor, opening containers)    Time 5    Period Weeks    Status On-going      OT LONG TERM GOAL #2   Title Pt will report pain in RUE less than or equal to 2/10 with exercise and ADLs.    Time 5    Period Weeks    Status On-going      OT LONG TERM GOAL #3   Title Pt will improve L hand coordination for ADLs as shown by completing 9-hole peg test in 48 secs or less    Baseline 51.41 secs    Time 5    Period Weeks    Status On-going      OT LONG TERM GOAL #4   Title Pt will improve bilateral hand coordination as shown by fastening/unfastening 3 buttons in 48 sec or less.    Baseline 52.25    Time 5    Period Weeks    Status New      OT LONG TERM GOAL #5   Title Pt will be independent with updates to PD-specific HEP.    Time 5    Period Weeks    Status On-going                   Plan - 05/21/21 1047     Clinical Impression Statement Pt is progressing towards goals.  His shoulder pain appears to be improving with review of proper positioning and use of ultrasound.    OT Occupational Profile and History Detailed Assessment- Review of Records and additional review of physical, cognitive, psychosocial history related to current functional performance    Occupational performance deficits (Please refer to evaluation for details): ADL's;IADL's;Leisure    Body Structure / Function / Physical Skills ADL;Dexterity;IADL;Balance;Mobility;Coordination;FMC;Tone;UE functional use;Decreased knowledge of use of DME;GMC;Endurance;Improper spinal/pelvic alignment;Flexibility   bradykinesia   Rehab Potential Good    Clinical Decision Making Several treatment options, min-mod task modification necessary    Comorbidities  Affecting Occupational Performance: May have comorbidities impacting occupational performance    Modification or Assistance to Complete Evaluation  Min-Moderate modification of tasks or assist with assess necessary to complete eval    OT Frequency 2x / week    OT Duration --  x 5 weeks plus eval or 11 visits total   OT Treatment/Interventions Self-care/ADL training;Moist Heat;Fluidtherapy;DME and/or AE instruction;Therapeutic activities;Aquatic Therapy;Therapeutic exercise;Functional Mobility Training;Neuromuscular education;Cryotherapy;Energy conservation;Manual Therapy;Patient/family education;Passive range of motion    Plan ultrasound, check kinesiotape effectiveness, start checking golas- pt has 2 remaining visits    Consulted and Agree with Plan of Care Patient             Patient will benefit from skilled therapeutic intervention in order to improve the following deficits and impairments:   Body Structure / Function / Physical Skills: ADL, Dexterity, IADL, Balance, Mobility, Coordination, FMC, Tone, UE functional use, Decreased knowledge of use of DME, GMC, Endurance, Improper spinal/pelvic alignment, Flexibility (bradykinesia)       Visit Diagnosis: Other lack of coordination  Other symptoms and signs involving the nervous system  Abnormal posture  Other symptoms and signs involving the musculoskeletal system  Other abnormalities of gait and mobility  Unsteadiness on feet  Stiffness of right shoulder, not elsewhere classified  Stiffness of left shoulder, not elsewhere classified    Problem List Patient Active Problem List   Diagnosis Date Noted   Parkinson's disease ([place]) 04/13/2017   BPH (benign prostatic hypertrophy) with urinary retention 09/30/2015    Magnus Crescenzo, OT/L 05/21/2021, 10:53 AM  [facility] [address] [place], [place], [address] Phone: [phone]   Fax:  [phone]  Name:  Meiko Stranahan. MRN: [id number] Date of Birth: Nov 30, 1940

## 2021-05-21 NOTE — Patient Instructions (Signed)
Kinesiotape wear-You can wear tape for 3-5 days if no problems If increased pain or redness, wet tape in shower and slowly remove tape

## 2021-05-27 ENCOUNTER — Encounter: Payer: Self-pay | Admitting: Occupational Therapy

## 2021-05-27 ENCOUNTER — Other Ambulatory Visit: Payer: Self-pay

## 2021-05-27 ENCOUNTER — Ambulatory Visit: Payer: No Typology Code available for payment source | Admitting: Occupational Therapy

## 2021-05-27 DIAGNOSIS — R2681 Unsteadiness on feet: Secondary | ICD-10-CM

## 2021-05-27 DIAGNOSIS — R29898 Other symptoms and signs involving the musculoskeletal system: Secondary | ICD-10-CM

## 2021-05-27 DIAGNOSIS — R293 Abnormal posture: Secondary | ICD-10-CM

## 2021-05-27 DIAGNOSIS — R278 Other lack of coordination: Secondary | ICD-10-CM | POA: Diagnosis not present

## 2021-05-27 DIAGNOSIS — R29818 Other symptoms and signs involving the nervous system: Secondary | ICD-10-CM

## 2021-05-27 DIAGNOSIS — R251 Tremor, unspecified: Secondary | ICD-10-CM

## 2021-05-27 DIAGNOSIS — M25612 Stiffness of left shoulder, not elsewhere classified: Secondary | ICD-10-CM

## 2021-05-27 DIAGNOSIS — M25611 Stiffness of right shoulder, not elsewhere classified: Secondary | ICD-10-CM

## 2021-05-27 NOTE — Therapy (Signed)
[facility] [address] [place], [place], [address] Phone: [phone]   Fax:  [phone]  Occupational Therapy Treatment  Patient Details  Name: Zachary Wang. MRN: [id number] Date of Birth: 03-11-1941 Referring Provider (OT): Dr. Wells Guiles Tat   Encounter Date: 05/27/2021   OT End of Session - 05/27/21 0842     Visit Number 8    Number of Visits 11    Date for OT Re-Evaluation 06/10/21    Authorization Type VA await clarification of number of visits    Authorization Time Period [place] auth period 04/09/21-08/07/21    Authorization - Visit Number 8    Authorization - Number of Visits 10    Progress Note Due on Visit 10    OT Start Time 0810    OT Stop Time 0848    OT Time Calculation (min) 38 min    Activity Tolerance Patient tolerated treatment well    Behavior During Therapy [facility] for tasks assessed/performed             Past Medical History:  Diagnosis Date   Arthritis    hands   BPH (benign prostatic hyperplasia)    Elevated PSA    Foley catheter in place    History of adenomatous polyp of colon    2014  tubular adenoma   History of malignant melanoma    s/p  excision left forearm and left axill lymph node bx 09-10-2004   Hyperlipidemia    Hypertension    Peripheral vascular disease [facility])    Urinary retention     Past Surgical History:  Procedure Laterality Date   ANAL FISSURE REPAIR  1984   COLONOSCOPY N/A 05/02/2013   Procedure: COLONOSCOPY;  Surgeon: Garlan Fair, MD;  [facility];  Service: Endoscopy;  Laterality: N/A;   INSERTION OF SUPRAPUBIC CATHETER N/A 09/30/2015   Procedure: INSERTION OF SUPRAPUBIC CATHETER;  Surgeon: Kathie Rhodes, MD;  Location: [facility];  Service: Urology;  Laterality: N/A;   SHOULDER ARTHROSCOPY W/ SUBACROMIAL DECOMPRESSION AND DISTAL CLAVICLE EXCISION Left 05-20-2010   w/  Debridement , Bursectomy, Acromioplasty,  Capsulectomy    TRANSURETHRAL RESECTION OF PROSTATE N/A 09/30/2015   Procedure: TURP (TRANSURETHRAL RESECTION OF PROSTATE WITH GYRUS;  Surgeon: Kathie Rhodes, MD;  Location: [facility];  Service: Urology;  Laterality: N/A;   WIDE EXCISION MALIGNANT MELANOMA LEFT FOREARM/  LEFT AXILLA LYMPH NODE BX  09-10-2004    There were no vitals filed for this visit.   Subjective Assessment - 05/27/21 0838     Subjective  Pt reports continued shoulder pain with certain movements.  Pt reports tape helped    Pertinent History Parkinson's disease (03/2017 diagnosis).  PMH:  arthritis (limited L wrist flex due to arthritis), hx of malignant melanoma, HDL, HTN, Peripheral vascular disease, hx of L shoudler arthroscopy 2011, hx of bilateral cataract surgery    Patient Stated Goals improve fastening buttons, improve coordination    Currently in Pain? Yes    Pain Score 6     Pain Location Shoulder    Pain Orientation Right    Pain Descriptors / Indicators Aching    Pain Type Acute pain    Pain Onset More than a month ago    Pain Frequency Intermittent    Aggravating Factors  heavy wt.bearing, ER    Pain Relieving Factors rest, repositioning               Ultrasound x24min to  R lateral upper arm 50mhz, 1.1wts/cm2, 100% continuous with no adverse reactions for pain.    Kinesiotape applied to R upper arm due to tightness/pain with trigger point.  Reviewed kinesiotape wear.  Pt instructed to avoid guarding pattern with RUE.  Standing, shoulder flex with BUEs with ball followed by diagonals to each side with min cueing for elbow ext and posture--no pain.  Practiced fastening/unfastening buttons with min cueing for large amplitude movements  Began checking goals and discussing progress.  Practiced opening/closing containers/bottles with each hand with use of large amplitude movement strategies        OT Short Term Goals - 04/29/21 1158       OT SHORT TERM GOAL #1   Title no STG's      OT SHORT  TERM GOAL #2   Title --               OT Long Term Goals - 05/27/21 0840       OT LONG TERM GOAL #1   Title Pt will verbalize understanding of adaptive strategies to incr ease/efficiency with ADLs/IADLs (including buttoning, picking up items from floor, opening containers)    Time 5    Period Weeks    Status On-going      OT LONG TERM GOAL #2   Title Pt will report pain in RUE less than or equal to 2/10 with exercise and ADLs.    Time 5    Period Weeks    Status On-going      OT LONG TERM GOAL #3   Title Pt will improve L hand coordination for ADLs as shown by completing 9-hole peg test in 48 secs or less    Baseline 51.41 secs    Time 5    Period Weeks    Status On-going   05/27/21:  49.78sec     OT LONG TERM GOAL #4   Title Pt will improve bilateral hand coordination as shown by fastening/unfastening 3 buttons in 48 sec or less.    Baseline 52.25    Time 5    Period Weeks    Status Achieved   05/27/21:  42.28sec     OT LONG TERM GOAL #5   Title Pt will be independent with updates to PD-specific HEP.    Time 5    Period Weeks    Status On-going                   Plan - 05/27/21 6295     Clinical Impression Statement Pt is progressing towards goals.  His shoulder pain appears to be improving with review of proper positioning, kinesiotape, and use of ultrasound, but had pain between therapy session at times.  Cautioned pt to avoid guarding patterns.    OT Occupational Profile and History Detailed Assessment- Review of Records and additional review of physical, cognitive, psychosocial history related to current functional performance    Occupational performance deficits (Please refer to evaluation for details): ADL's;IADL's;Leisure    Body Structure / Function / Physical Skills ADL;Dexterity;IADL;Balance;Mobility;Coordination;FMC;Tone;UE functional use;Decreased knowledge of use of DME;GMC;Endurance;Improper spinal/pelvic alignment;Flexibility   bradykinesia    Rehab Potential Good    Clinical Decision Making Several treatment options, min-mod task modification necessary    Comorbidities Affecting Occupational Performance: May have comorbidities impacting occupational performance    Modification or Assistance to Complete Evaluation  Min-Moderate modification of tasks or assist with assess necessary to complete eval    OT Frequency 2x / week  OT Duration --   x 5 weeks plus eval or 11 visits total   OT Treatment/Interventions Self-care/ADL training;Moist Heat;Fluidtherapy;DME and/or AE instruction;Therapeutic activities;Aquatic Therapy;Therapeutic exercise;Functional Mobility Training;Neuromuscular education;Cryotherapy;Energy conservation;Manual Therapy;Patient/family education;Passive range of motion    Plan ultrasound, check remaining goals, ?d/c (schedule screen)    Consulted and Agree with Plan of Care Patient             Patient will benefit from skilled therapeutic intervention in order to improve the following deficits and impairments:   Body Structure / Function / Physical Skills: ADL, Dexterity, IADL, Balance, Mobility, Coordination, FMC, Tone, UE functional use, Decreased knowledge of use of DME, GMC, Endurance, Improper spinal/pelvic alignment, Flexibility (bradykinesia)       Visit Diagnosis: Other lack of coordination  Other symptoms and signs involving the nervous system  Abnormal posture  Other symptoms and signs involving the musculoskeletal system  Unsteadiness on feet  Stiffness of right shoulder, not elsewhere classified  Tremor  Stiffness of left shoulder, not elsewhere classified    Problem List Patient Active Problem List   Diagnosis Date Noted   Parkinson's disease ([place]) 04/13/2017   BPH (benign prostatic hypertrophy) with urinary retention 09/30/2015    [facility], OT/L 05/27/2021, 2:28 PM  [facility] [address] [place], [place], [address] Phone: [phone]   Fax:  [phone]  Name: Zachary Wang. MRN: [id number] Date of Birth: Oct 22, 1940  Vianne Bulls, OTR/L [facility] [address] [place], [place]  [address] [phone] phone [phone] 05/27/21 2:28 PM

## 2021-06-03 ENCOUNTER — Encounter: Payer: Self-pay | Admitting: Occupational Therapy

## 2021-06-03 ENCOUNTER — Ambulatory Visit: Payer: No Typology Code available for payment source | Admitting: Occupational Therapy

## 2021-06-03 ENCOUNTER — Other Ambulatory Visit: Payer: Self-pay

## 2021-06-03 DIAGNOSIS — R278 Other lack of coordination: Secondary | ICD-10-CM | POA: Diagnosis not present

## 2021-06-03 DIAGNOSIS — M25612 Stiffness of left shoulder, not elsewhere classified: Secondary | ICD-10-CM

## 2021-06-03 DIAGNOSIS — R2681 Unsteadiness on feet: Secondary | ICD-10-CM

## 2021-06-03 DIAGNOSIS — R29818 Other symptoms and signs involving the nervous system: Secondary | ICD-10-CM

## 2021-06-03 DIAGNOSIS — R2689 Other abnormalities of gait and mobility: Secondary | ICD-10-CM

## 2021-06-03 DIAGNOSIS — R29898 Other symptoms and signs involving the musculoskeletal system: Secondary | ICD-10-CM

## 2021-06-03 DIAGNOSIS — R293 Abnormal posture: Secondary | ICD-10-CM

## 2021-06-03 DIAGNOSIS — R251 Tremor, unspecified: Secondary | ICD-10-CM

## 2021-06-03 DIAGNOSIS — M25611 Stiffness of right shoulder, not elsewhere classified: Secondary | ICD-10-CM

## 2021-06-03 NOTE — Therapy (Addendum)
[place] [address] [place], [place], [address] Phone: [phone]   Fax:  [phone]  Occupational Therapy Treatment  Patient Details  Name: Zachary Wang. MRN: [id number] Date of Birth: 06/22/1941 Referring Provider (OT): Dr. Wells Guiles Tat   Encounter Date: 06/03/2021   OT End of Session - 06/03/21 0959     Visit Number 9    Number of Visits 11    Date for OT Re-Evaluation 06/10/21    Authorization Type VA    Authorization Time Period VA auth period 04/09/21-08/07/21    Authorization - Visit Number 9    Authorization - Number of Visits 10    Progress Note Due on Visit 10    OT Start Time 0807    OT Stop Time 0848    OT Time Calculation (min) 41 min    Activity Tolerance Patient tolerated treatment well    Behavior During Therapy [facility] for tasks assessed/performed             Past Medical History:  Diagnosis Date   Arthritis    hands   BPH (benign prostatic hyperplasia)    Elevated PSA    Foley catheter in place    History of adenomatous polyp of colon    2014  tubular adenoma   History of malignant melanoma    s/p  excision left forearm and left axill lymph node bx 09-10-2004   Hyperlipidemia    Hypertension    Peripheral vascular disease [facility])    Urinary retention     Past Surgical History:  Procedure Laterality Date   ANAL FISSURE REPAIR  1984   COLONOSCOPY N/A 05/02/2013   Procedure: COLONOSCOPY;  Surgeon: Garlan Fair, MD;  Location: [facility];  Service: Endoscopy;  Laterality: N/A;   INSERTION OF SUPRAPUBIC CATHETER N/A 09/30/2015   Procedure: INSERTION OF SUPRAPUBIC CATHETER;  Surgeon: Kathie Rhodes, MD;  Location: [facility];  Service: Urology;  Laterality: N/A;   SHOULDER ARTHROSCOPY W/ SUBACROMIAL DECOMPRESSION AND DISTAL CLAVICLE EXCISION Left 05-20-2010   w/  Debridement , Bursectomy, Acromioplasty,  Capsulectomy   TRANSURETHRAL RESECTION OF PROSTATE N/A  09/30/2015   Procedure: TURP (TRANSURETHRAL RESECTION OF PROSTATE WITH GYRUS;  Surgeon: Kathie Rhodes, MD;  Location: [place];  Service: Urology;  Laterality: N/A;   WIDE EXCISION MALIGNANT MELANOMA LEFT FOREARM/  LEFT AXILLA LYMPH NODE BX  09-10-2004    There were no vitals filed for this visit.   Subjective Assessment - 06/03/21 0850     Subjective  Pt reports continued shoulder pain with certain movements, but improved.  Pt reports tape helped and stayed on well.    Pertinent History Parkinson's disease (03/2017 diagnosis).  PMH:  arthritis (limited L wrist flex due to arthritis), hx of malignant melanoma, HDL, HTN, Peripheral vascular disease, hx of L shoudler arthroscopy 2011, hx of bilateral cataract surgery    Patient Stated Goals improve fastening buttons, improve coordination    Currently in Pain? No/denies    Pain Onset More than a month ago              Ultrasound x98min to R lateral upper arm 31mhz, 1.1wts/cm2, 100% continuous with no adverse reactions for pain.    Kinesiotape applied to R upper arm due to tightness/pain with trigger points.  Seated, closed-chain shoulder flexion with ball with BUEs with min cueing for trunk hyperext and posture, cane ex for shoulder abduction to each side with min cueing for  positioning/posture  Functional reaching laterally and overhead with RUE with min cueing for positioning/posture.  Standing, PWR! Up with min cueing for posture and head positioning.          OT Education - 06/03/21 1013     Education Details Reviewed importance of posture, R shoulder positioning, and avoiding guarding patterns to decr pain    Person(s) Educated Patient    Methods Explanation;Demonstration;Verbal cues   also used mirror   Comprehension Verbalized understanding;Returned demonstration;Verbal cues required              OT Short Term Goals - 04/29/21 1158       OT SHORT TERM GOAL #1   Title no STG's      OT SHORT  TERM GOAL #2   Title --               OT Long Term Goals - 05/27/21 0840       OT LONG TERM GOAL #1   Title Pt will verbalize understanding of adaptive strategies to incr ease/efficiency with ADLs/IADLs (including buttoning, picking up items from floor, opening containers)    Time 5    Period Weeks    Status On-going      OT LONG TERM GOAL #2   Title Pt will report pain in RUE less than or equal to 2/10 with exercise and ADLs.    Time 5    Period Weeks    Status On-going      OT LONG TERM GOAL #3   Title Pt will improve L hand coordination for ADLs as shown by completing 9-hole peg test in 48 secs or less    Baseline 51.41 secs    Time 5    Period Weeks    Status On-going   05/27/21:  49.78sec     OT LONG TERM GOAL #4   Title Pt will improve bilateral hand coordination as shown by fastening/unfastening 3 buttons in 48 sec or less.    Baseline 52.25    Time 5    Period Weeks    Status Achieved   05/27/21:  42.28sec     OT LONG TERM GOAL #5   Title Pt will be independent with updates to PD-specific HEP.    Time 5    Period Weeks    Status On-going                   Plan - 06/03/21 1002     Clinical Impression Statement Pt is progressing towards goals.  His shoulder pain appears to be improving with review of proper positioning, kinesiotape, and use of ultrasound.  Continued to emphasize proper positioning and posture and cautioned pt to avoid guarding patterns.    OT Occupational Profile and History Detailed Assessment- Review of Records and additional review of physical, cognitive, psychosocial history related to current functional performance    Occupational performance deficits (Please refer to evaluation for details): ADL's;IADL's;Leisure    Body Structure / Function / Physical Skills ADL;Dexterity;IADL;Balance;Mobility;Coordination;FMC;Tone;UE functional use;Decreased knowledge of use of DME;GMC;Endurance;Improper spinal/pelvic alignment;Flexibility    bradykinesia   Rehab Potential Good    Clinical Decision Making Several treatment options, min-mod task modification necessary    Comorbidities Affecting Occupational Performance: May have comorbidities impacting occupational performance    Modification or Assistance to Complete Evaluation  Min-Moderate modification of tasks or assist with assess necessary to complete eval    OT Frequency 2x / week    OT Duration --   x  5 weeks plus eval or 11 visits total   OT Treatment/Interventions Self-care/ADL training;Moist Heat;Fluidtherapy;DME and/or AE instruction;Therapeutic activities;Aquatic Therapy;Therapeutic exercise;Functional Mobility Training;Neuromuscular education;Cryotherapy;Energy conservation;Manual Therapy;Patient/family education;Passive range of motion    Plan ultrasound, kinesiotape, reaching with good positioning, ?d/c (schedule screen) next week (added 2 more visits to continue to address shoulder pain)    Consulted and Agree with Plan of Care Patient             Patient will benefit from skilled therapeutic intervention in order to improve the following deficits and impairments:   Body Structure / Function / Physical Skills: ADL, Dexterity, IADL, Balance, Mobility, Coordination, FMC, Tone, UE functional use, Decreased knowledge of use of DME, GMC, Endurance, Improper spinal/pelvic alignment, Flexibility (bradykinesia)       Visit Diagnosis: Other lack of coordination  Abnormal posture  Other symptoms and signs involving the nervous system  Other symptoms and signs involving the musculoskeletal system  Unsteadiness on feet  Stiffness of right shoulder, not elsewhere classified  Tremor  Stiffness of left shoulder, not elsewhere classified  Other abnormalities of gait and mobility    Problem List Patient Active Problem List   Diagnosis Date Noted   Parkinson's disease ([place]) 04/13/2017   BPH (benign prostatic hypertrophy) with urinary retention 09/30/2015     [facility], OT/L 06/03/2021, 3:11 PM  [place] [address] [place], [place], [address] Phone: [phone]   Fax:  [phone]  Name: Zachary Wang. MRN: [id number] Date of Birth: Oct 24, 1940  Vianne Bulls, OTR/L [facility] [address] [place], [place]  [address] [phone] phone [phone] 06/03/21 3:11 PM

## 2021-06-05 ENCOUNTER — Encounter: Payer: Self-pay | Admitting: Occupational Therapy

## 2021-06-05 ENCOUNTER — Ambulatory Visit: Payer: No Typology Code available for payment source | Admitting: Occupational Therapy

## 2021-06-05 ENCOUNTER — Other Ambulatory Visit: Payer: Self-pay

## 2021-06-05 DIAGNOSIS — M25611 Stiffness of right shoulder, not elsewhere classified: Secondary | ICD-10-CM

## 2021-06-05 DIAGNOSIS — R278 Other lack of coordination: Secondary | ICD-10-CM | POA: Diagnosis not present

## 2021-06-05 DIAGNOSIS — R251 Tremor, unspecified: Secondary | ICD-10-CM

## 2021-06-05 DIAGNOSIS — M25612 Stiffness of left shoulder, not elsewhere classified: Secondary | ICD-10-CM

## 2021-06-05 DIAGNOSIS — R2689 Other abnormalities of gait and mobility: Secondary | ICD-10-CM

## 2021-06-05 DIAGNOSIS — R29818 Other symptoms and signs involving the nervous system: Secondary | ICD-10-CM

## 2021-06-05 DIAGNOSIS — R29898 Other symptoms and signs involving the musculoskeletal system: Secondary | ICD-10-CM

## 2021-06-05 DIAGNOSIS — R293 Abnormal posture: Secondary | ICD-10-CM

## 2021-06-05 DIAGNOSIS — R2681 Unsteadiness on feet: Secondary | ICD-10-CM

## 2021-06-05 NOTE — Therapy (Signed)
[place] [address] [place], [place], [address] Phone: [phone]   Fax:  [phone]  Occupational Therapy Treatment  Patient Details  Name: Zachary Wang. MRN: [id number] Date of Birth: 1941/02/05 Referring Provider (OT): Dr. Wells Guiles Tat   Encounter Date: 06/05/2021   OT End of Session - 06/05/21 1450     Visit Number 10    Number of Visits 11    Date for OT Re-Evaluation 06/10/21    Authorization Type VA    Authorization Time Period VA auth period 04/09/21-08/07/21    Authorization - Visit Number 10    Authorization - Number of Visits 10    Progress Note Due on Visit 10    OT Start Time 0848    OT Stop Time 0928    OT Time Calculation (min) 40 min    Activity Tolerance Patient tolerated treatment well    Behavior During Therapy [facility] for tasks assessed/performed             Past Medical History:  Diagnosis Date   Arthritis    hands   BPH (benign prostatic hyperplasia)    Elevated PSA    Foley catheter in place    History of adenomatous polyp of colon    2014  tubular adenoma   History of malignant melanoma    s/p  excision left forearm and left axill lymph node bx 09-10-2004   Hyperlipidemia    Hypertension    Peripheral vascular disease [facility])    Urinary retention     Past Surgical History:  Procedure Laterality Date   [place]   COLONOSCOPY N/A 05/02/2013   Procedure: COLONOSCOPY;  Surgeon: Garlan Fair, MD;  Location: [facility];  Service: Endoscopy;  Laterality: N/A;   INSERTION OF SUPRAPUBIC CATHETER N/A 09/30/2015   Procedure: INSERTION OF SUPRAPUBIC CATHETER;  Surgeon: Kathie Rhodes, MD;  [facility];  Service: Urology;  Laterality: N/A;   SHOULDER ARTHROSCOPY W/ SUBACROMIAL DECOMPRESSION AND DISTAL CLAVICLE EXCISION Left 05-20-2010   w/  Debridement , Bursectomy, Acromioplasty,  Capsulectomy   TRANSURETHRAL RESECTION OF PROSTATE N/A  09/30/2015   Procedure: TURP (TRANSURETHRAL RESECTION OF PROSTATE WITH GYRUS;  Surgeon: Kathie Rhodes, MD;  Location: [place];  Service: Urology;  Laterality: N/A;   WIDE EXCISION MALIGNANT MELANOMA LEFT FOREARM/  LEFT AXILLA LYMPH NODE BX  09-10-2004    There were no vitals filed for this visit.   Subjective Assessment - 06/05/21 1449     Subjective  "I think it is getting better"  "I've got to remember to keep my shoulder back"    Pertinent History Parkinson's disease (03/2017 diagnosis).  PMH:  arthritis (limited L wrist flex due to arthritis), hx of malignant melanoma, HDL, HTN, Peripheral vascular disease, hx of L shoudler arthroscopy 2011, hx of bilateral cataract surgery    Patient Stated Goals improve fastening buttons, improve coordination    Currently in Pain? No/denies   during therapy   Pain Onset More than a month ago               Ultrasound x71min to R lateral upper arm 5mhz, 1.1wts/cm2, 100% continuous with no adverse reactions for pain.    Kinesiotape applied to R lateral upper arm due to tightness/pain with trigger points and correction piece at shoulder to create space.  Supine, PWR! Up with arms by side and in slight abduction with min cueing, followed by scapular depression,  closed-chain shoulder flexion within pain-free range.   Seated, closed-chain shoulder flexion with ball with BUEs with min cueing for trunk hyperext and posture, cane ex for shoulder abduction to each side with min cueing for positioning/posture.  PWR! Up with min cueing for posture/head position.    Standing, Functional reaching laterally and overhead with RUE with min cueing for positioning/posture and incorporating trunk rotation, wt. Shift for associated trunk movements.  Standing, Functional reaching laterally and forward with trunk rotation/lateral wt. Shift with min cueing for trunk roation and R shoulder positioning/posture to flip large cards with PWR! Hands/large  amplitude movements.        OT Short Term Goals - 04/29/21 1158       OT SHORT TERM GOAL #1   Title no STG's      OT SHORT TERM GOAL #2   Title --               OT Long Term Goals - 05/27/21 0840       OT LONG TERM GOAL #1   Title Pt will verbalize understanding of adaptive strategies to incr ease/efficiency with ADLs/IADLs (including buttoning, picking up items from floor, opening containers)    Time 5    Period Weeks    Status On-going      OT LONG TERM GOAL #2   Title Pt will report pain in RUE less than or equal to 2/10 with exercise and ADLs.    Time 5    Period Weeks    Status On-going      OT LONG TERM GOAL #3   Title Pt will improve L hand coordination for ADLs as shown by completing 9-hole peg test in 48 secs or less    Baseline 51.41 secs    Time 5    Period Weeks    Status On-going   05/27/21:  49.78sec     OT LONG TERM GOAL #4   Title Pt will improve bilateral hand coordination as shown by fastening/unfastening 3 buttons in 48 sec or less.    Baseline 52.25    Time 5    Period Weeks    Status Achieved   05/27/21:  42.28sec     OT LONG TERM GOAL #5   Title Pt will be independent with updates to PD-specific HEP.    Time 5    Period Weeks    Status On-going                   Plan - 06/05/21 1450     Clinical Impression Statement Progress Note Reporting Period 9/13-10/20/22:  Shoulder pain appears to be improving with review of proper positioning, kinesiotape, and use of ultrasound.  Pt met LTG#4 and is progressing toward remaining goals.  Pt will continue to benefit from occupational therapy to emphasize proper positioning/posture as well as reinforce HEP/strategies for ADLs for decr LUE pain, incr carryover/callibration of large amplitude movements, and to decr risk of future complications related to PD.    OT Occupational Profile and History Detailed Assessment- Review of Records and additional review of physical, cognitive,  psychosocial history related to current functional performance    Occupational performance deficits (Please refer to evaluation for details): ADL's;IADL's;Leisure    Body Structure / Function / Physical Skills ADL;Dexterity;IADL;Balance;Mobility;Coordination;FMC;Tone;UE functional use;Decreased knowledge of use of DME;GMC;Endurance;Improper spinal/pelvic alignment;Flexibility   bradykinesia   Rehab Potential Good    Clinical Decision Making Several treatment options, min-mod task modification necessary    Comorbidities Affecting Occupational  Performance: May have comorbidities impacting occupational performance    Modification or Assistance to Complete Evaluation  Min-Moderate modification of tasks or assist with assess necessary to complete eval    OT Frequency 2x / week    OT Duration --   x 5 weeks plus eval or 11 visits total   OT Treatment/Interventions Self-care/ADL training;Moist Heat;Fluidtherapy;DME and/or AE instruction;Therapeutic activities;Aquatic Therapy;Therapeutic exercise;Functional Mobility Training;Neuromuscular education;Cryotherapy;Energy conservation;Manual Therapy;Patient/family education;Passive range of motion    Plan ultrasound, kinesiotape, reaching with good positioning, check remaining goals and ?d/c (schedule screen) next visit    Consulted and Agree with Plan of Care Patient             Patient will benefit from skilled therapeutic intervention in order to improve the following deficits and impairments:   Body Structure / Function / Physical Skills: ADL, Dexterity, IADL, Balance, Mobility, Coordination, FMC, Tone, UE functional use, Decreased knowledge of use of DME, GMC, Endurance, Improper spinal/pelvic alignment, Flexibility (bradykinesia)       Visit Diagnosis: Other lack of coordination  Abnormal posture  Other symptoms and signs involving the nervous system  Other symptoms and signs involving the musculoskeletal system  Unsteadiness on  feet  Stiffness of right shoulder, not elsewhere classified  Tremor  Stiffness of left shoulder, not elsewhere classified  Other abnormalities of gait and mobility    Problem List Patient Active Problem List   Diagnosis Date Noted   Parkinson's disease ([place]) 04/13/2017   BPH (benign prostatic hypertrophy) with urinary retention 09/30/2015    [facility], OT/L 06/05/2021, 2:55 PM  [place] [address] [place], [place], [address] Phone: [phone]   Fax:  [phone]  Name: Abdurrahman Petersheim. MRN: [id number] Date of Birth: [birth year]  Vianne Bulls, OTR/L [facility] [address] [place], [place]  [address] [phone] phone [phone] 06/05/21 2:55 PM

## 2021-06-09 ENCOUNTER — Other Ambulatory Visit: Payer: Self-pay

## 2021-06-09 ENCOUNTER — Ambulatory Visit: Payer: No Typology Code available for payment source | Admitting: Occupational Therapy

## 2021-06-09 ENCOUNTER — Encounter: Payer: Self-pay | Admitting: Occupational Therapy

## 2021-06-09 DIAGNOSIS — R278 Other lack of coordination: Secondary | ICD-10-CM

## 2021-06-09 DIAGNOSIS — R29818 Other symptoms and signs involving the nervous system: Secondary | ICD-10-CM

## 2021-06-09 DIAGNOSIS — M25611 Stiffness of right shoulder, not elsewhere classified: Secondary | ICD-10-CM

## 2021-06-09 DIAGNOSIS — M25612 Stiffness of left shoulder, not elsewhere classified: Secondary | ICD-10-CM

## 2021-06-09 DIAGNOSIS — R251 Tremor, unspecified: Secondary | ICD-10-CM

## 2021-06-09 DIAGNOSIS — R29898 Other symptoms and signs involving the musculoskeletal system: Secondary | ICD-10-CM

## 2021-06-09 DIAGNOSIS — R2681 Unsteadiness on feet: Secondary | ICD-10-CM

## 2021-06-09 DIAGNOSIS — R293 Abnormal posture: Secondary | ICD-10-CM

## 2021-06-09 NOTE — Therapy (Signed)
[facility] [address] [place], [place], [address] Phone: [phone]   Fax:  [phone]  Occupational Therapy Treatment  Patient Details  Name: Zachary Wang. MRN: [id number] Date of Birth: 21-Feb-1941 Referring Provider (OT): Dr. Wells Guiles Tat   Encounter Date: 06/09/2021   OT End of Session - 06/09/21 0828     Visit Number 11    Number of Visits 11    Date for OT Re-Evaluation 06/10/21    Authorization Type VA    Authorization Time Period VA auth period 04/09/21-08/07/21    Authorization - Visit Number 11    Authorization - Number of Visits 20    Progress Note Due on Visit 10    OT Start Time 0802    OT Stop Time 3005    OT Time Calculation (min) 42 min    Activity Tolerance Patient tolerated treatment well    Behavior During Therapy [facility] for tasks assessed/performed             Past Medical History:  Diagnosis Date   Arthritis    hands   BPH (benign prostatic hyperplasia)    Elevated PSA    Foley catheter in place    History of adenomatous polyp of colon    2014  tubular adenoma   History of malignant melanoma    s/p  excision left forearm and left axill lymph node bx 09-10-2004   Hyperlipidemia    Hypertension    Peripheral vascular disease [facility])    Urinary retention     Past Surgical History:  Procedure Laterality Date   ANAL FISSURE REPAIR  1984   COLONOSCOPY N/A 05/02/2013   Procedure: COLONOSCOPY;  Surgeon: Garlan Fair, MD;  Location: [facility];  Service: Endoscopy;  Laterality: N/A;   INSERTION OF SUPRAPUBIC CATHETER N/A 09/30/2015   Procedure: INSERTION OF SUPRAPUBIC CATHETER;  Surgeon: Kathie Rhodes, MD;  Location: [facility];  Service: Urology;  Laterality: N/A;   SHOULDER ARTHROSCOPY W/ SUBACROMIAL DECOMPRESSION AND DISTAL CLAVICLE EXCISION Left 05-20-2010   w/  Debridement , Bursectomy, Acromioplasty,  Capsulectomy   TRANSURETHRAL RESECTION OF PROSTATE N/A  09/30/2015   Procedure: TURP (TRANSURETHRAL RESECTION OF PROSTATE WITH GYRUS;  Surgeon: Kathie Rhodes, MD;  Location: [facility];  Service: Urology;  Laterality: N/A;   WIDE EXCISION MALIGNANT MELANOMA LEFT FOREARM/  LEFT AXILLA LYMPH NODE BX  09-10-2004    There were no vitals filed for this visit.   Subjective Assessment - 06/09/21 0827     Subjective  It's better (R shoulder/arm pain)    Pertinent History Parkinson's disease (03/2017 diagnosis).  PMH:  arthritis (limited L wrist flex due to arthritis), hx of malignant melanoma, HDL, HTN, Peripheral vascular disease, hx of L shoudler arthroscopy 2011, hx of bilateral cataract surgery    Patient Stated Goals improve fastening buttons, improve coordination    Currently in Pain? No/denies    Pain Onset More than a month ago                  Ultrasound x74mn to R lateral upper arm 131m, 1.1wts/cm2, 100% continuous with no adverse reactions for pain.    Kinesiotape applied to R lateral upper arm due to tightness/pain with trigger points and correction piece at shoulder to create space.  Supine, PWR! Up with arms by side and in slight abduction with min cueing, followed by scapular depression, closed-chain shoulder flexion within pain-free range.   Seated, closed-chain shoulder  flexion with ball with BUEs with min cueing for  posture, cane ex for shoulder abduction to each side with min cueing for positioning/posture.    Standing, Functional reaching laterally and overhead with RUE with min cueing for positioning/posture and incorporating trunk rotation, wt. Shift for associated trunk movements.  Donning/doffing jacket with no pain reported.  Educated pt in importance of initiating movement with trunk and shoulder together when rolling over in bed.  Reviewed importance of posture for shoulder movements.  Checked goals and discussed progress and recommended follow-up screens in approx 6 months.  Discussed indications for  sooner return to OT.          OT Short Term Goals - 04/29/21 1158       OT SHORT TERM GOAL #1   Title no STG's      OT SHORT TERM GOAL #2   Title --               OT Long Term Goals - 06/09/21 0831       OT LONG TERM GOAL #1   Title Pt will verbalize understanding of adaptive strategies to incr ease/efficiency with ADLs/IADLs (including buttoning, picking up items from floor, opening containers)    Time 5    Period Weeks    Status Achieved      OT LONG TERM GOAL #2   Title Pt will report pain in RUE less than or equal to 2/10 with exercise and ADLs.    Time 5    Period Weeks    Status Partially Met   06/09/21:  0-4/10, but only with certain movements, resolves immediately with rest/change of positions     OT LONG TERM GOAL #3   Title Pt will improve L hand coordination for ADLs as shown by completing 9-hole peg test in 48 secs or less    Baseline 51.41 secs    Time 5    Period Weeks    Status Achieved   05/27/21:  49.78sec.    06/09/21:  47.78sec,  39.10sec with PWR! hands.     OT LONG TERM GOAL #4   Title Pt will improve bilateral hand coordination as shown by fastening/unfastening 3 buttons in 48 sec or less.    Baseline 52.25    Time 5    Period Weeks    Status Achieved   05/27/21:  42.28sec     OT LONG TERM GOAL #5   Title Pt will be independent with updates to PD-specific HEP.    Time 5    Period Weeks    Status Achieved                   Plan - 06/09/21 0831     Clinical Impression Statement Pt has improved with L hand coordination and RUE pain.  However, pt continues to have occasional mild pain which improves with proper positioning/posture.    OT Occupational Profile and History Detailed Assessment- Review of Records and additional review of physical, cognitive, psychosocial history related to current functional performance    Occupational performance deficits (Please refer to evaluation for details): ADL's;IADL's;Leisure    Body  Structure / Function / Physical Skills ADL;Dexterity;IADL;Balance;Mobility;Coordination;FMC;Tone;UE functional use;Decreased knowledge of use of DME;GMC;Endurance;Improper spinal/pelvic alignment;Flexibility   bradykinesia   Rehab Potential Good    Clinical Decision Making Several treatment options, min-mod task modification necessary    Comorbidities Affecting Occupational Performance: May have comorbidities impacting occupational performance    Modification or Assistance to Complete Evaluation  Min-Moderate modification of tasks or assist with assess necessary to complete eval    OT Frequency 2x / week    OT Duration --   x 5 weeks plus eval or 11 visits total   OT Treatment/Interventions Self-care/ADL training;Moist Heat;Fluidtherapy;DME and/or AE instruction;Therapeutic activities;Aquatic Therapy;Therapeutic exercise;Functional Mobility Training;Neuromuscular education;Cryotherapy;Energy conservation;Manual Therapy;Patient/family education;Passive range of motion    Plan d/c OT; recommended PD screens in approx 6 months    Consulted and Agree with Plan of Care Patient             Patient will benefit from skilled therapeutic intervention in order to improve the following deficits and impairments:   Body Structure / Function / Physical Skills: ADL, Dexterity, IADL, Balance, Mobility, Coordination, FMC, Tone, UE functional use, Decreased knowledge of use of DME, GMC, Endurance, Improper spinal/pelvic alignment, Flexibility (bradykinesia)       Visit Diagnosis: Other lack of coordination  Abnormal posture  Other symptoms and signs involving the nervous system  Other symptoms and signs involving the musculoskeletal system  Unsteadiness on feet  Stiffness of right shoulder, not elsewhere classified  Tremor  Stiffness of left shoulder, not elsewhere classified    Problem List Patient Active Problem List   Diagnosis Date Noted   Parkinson's disease ([place]) 04/13/2017   BPH  (benign prostatic hypertrophy) with urinary retention 09/30/2015     OCCUPATIONAL THERAPY DISCHARGE SUMMARY  Visits from Start of Care: 11  Current functional level related to goals / functional outcomes: See above   Remaining deficits: Bradykinesia, rigidity, decr coordination, abnormal posture, R shoulder pain--improved   Education / Equipment: Pt was instructed in the following:  PD-specific HEP, adaptive strategies for ADLs/IADLs, ways to prevent future complications.  Pt verbalized understanding of all education provided.   Plan: Patient agrees to discharge.  Patient goals were partially met. Patient is being discharged due to being pleased with the current functional level.  Pt would benefit from occupational therapy screen  in approx 6 months to assess for need for further therapy/functional changes due to progressive nature of diagnosis.     [facility], OT/L 06/09/2021, 10:46 AM  [facility] [address] [place], [place], [address] Phone: [phone]   Fax:  [phone]  Name: Zachary Wang. MRN: [id number] Date of Birth: 03-27-1941   Vianne Bulls, OTR/L [facility] [address] [place], [place]  [address] [phone] phone [phone] 06/09/21 10:46 AM

## 2021-06-23 ENCOUNTER — Telehealth: Payer: Self-pay

## 2021-06-23 NOTE — Telephone Encounter (Signed)
New message   Benefit Verification [id number] Submitted! For BV Basic submissions, please allow 24 hours for results.  For BV Full submissions, please allow 24-48 hours for results.

## 2021-06-24 NOTE — Telephone Encounter (Signed)
F/u  - CoverMyMeds  Zenaida Deed Key: [place] Dom - PA Case ID: [id number] Need help? Call us at [phone] Outcome Approved today Request Reference Number: [id number]. MYOBLOC INJ 5000/ML is approved through 09/24/2021. Your patient may now fill this prescription and it will be covered. Drug Myobloc 5000UNIT/ML solution Form [employer] Medicare Part D Electronic Prior Authorization Form (2017 NCPDP)

## 2021-06-24 NOTE — Telephone Encounter (Signed)
F/u   Your information has been sent to [employer].  Zenaida Deed Key: [place] Dom - PA Case ID: [id number] Need help? Call us at [phone] Status Sent to Plan today Drug Myobloc 5000UNIT/ML solution Form [employer] Medicare Part D Electronic Prior Authorization Form (2017 NCPDP)

## 2021-06-30 NOTE — Progress Notes (Signed)
Assessment/Plan:   1.  Parkinsons Disease  -Continue carbidopa/levodopa 25/100, 2/2/1  -Continue carbidopa/levodopa 50/200 at bedtime for cramping of feet/legs  -Continue pramipexole ER, 1.5 mg daily.  No compulsive behaviors.   Talked with patient about making sure that I am the only prescriber of his Parkinson's medications.  Since our last visit, a different provider had changed his medication to the pramipexole IR, 1.5 mg, but told him to only take it once per day.  Explained to him that if he was going to be changing the immediate release, he would need to take 0.5 mg 3 times per day, to make the same equivalent dose of the extended release formulation.  Pt wasn't even aware of that.    -Genetic testing completed.  Heterozygous for GBA mutation.  This could provide a risk factor for inherited Parkinson's, but likely would also require an environmental component.  This is different from homozygous carrier, which would have a much greater risk factor (10-20 times).   2.  History of malignant melanoma  -Follows with dermatology. Sees derm regularly  -We discussed that it used to be thought that levodopa would increase risk of melanoma but now it is believed that Parkinsons itself likely increases risk of melanoma. he is to get regular skin checks.  3.  Sialorrhea  -better with myobloc.  Last injection September 2.   Subjective:   Zachary Wang. was seen today in follow up for Parkinsons disease.  My previous records were reviewed prior to todays visit as well as outside records available to me. Pt denies falls.  There have been several phone calls since our last visit.  He asked for a letter for the [place] regarding his diagnosis, which was written.  His pharmacy called to state that he was no longer on the extended release version of pramipexole, but that someone had prescribed the immediate release form, but that he was only taking it once per day.  Explained that it does not work as a  once per day drug in the immediate release form.  Explained that if he was doing this for cost purposes, that he would need to take a 0.5 mg 3 times per day.  He doesn't know if it changed back.  He continues with Myobloc for sialorrhea.  That seems to be working well.  "It really helps me."  He has been to therapy since our last visit.  Notes are reviewed.  He is doing [employer] and yoga.  Has occasional visual distortion but no hallucination  Current prescribed movement disorder medications: Carbidopa/levodopa 25/100, 2/2/1 Pramipexole ER, 1.5 mg daily Carbidopa/levodopa 50/200 CR at bedtime   ALLERGIES:  No Known Allergies  CURRENT MEDICATIONS:  Outpatient Encounter Medications as of 07/02/2021  Medication Sig   amLODipine (NORVASC) 5 MG tablet Take 5 mg by mouth every morning.   atorvastatin (LIPITOR) 20 MG tablet Take 20 mg by mouth every evening.    carbidopa-levodopa (SINEMET CR) 50-200 MG tablet Take 1 tablet by mouth at bedtime.   carbidopa-levodopa (SINEMET IR) 25-100 MG tablet 2 at 7am, 2 at 11am, 1 at 4pm   hydrochlorothiazide (HYDRODIURIL) 25 MG tablet Take 25 mg by mouth every morning.    losartan (COZAAR) 100 MG tablet Take 50 mg by mouth 2 (two) times daily.   Pramipexole Dihydrochloride 1.5 MG TB24 Take 1 tablet (1.5 mg total) by mouth at bedtime.   No facility-administered encounter medications on file as of 07/02/2021.  Objective:   PHYSICAL EXAMINATION:    VITALS:   Vitals:   07/02/21 0809  BP: 124/62  Pulse: (!) 59  SpO2: 99%  Weight: 225 lb (102.1 kg)  Height: 6\' 2"  (1.88 m)     GEN:  The patient appears stated age and is in NAD. HEENT:  Normocephalic, atraumatic.  The mucous membranes are moist. The superficial temporal arteries are without ropiness or tenderness. CV:  RRR Lungs:  CTAB Neck/HEME:  There are no carotid bruits bilaterally.  Neurological examination:  Orientation: The patient is alert and oriented x3. Cranial nerves: There  is good facial symmetry with facial hypomimia. The speech is fluent and clear. Soft palate rises symmetrically and there is no tongue deviation. Hearing is intact to conversational tone. Sensation: Sensation is intact to light touch throughout Motor: Strength is at least antigravity x4.  Movement examination: Tone: There is mild increased tone in the UE bilaterally, L>R Abnormal movements: none Coordination:  There is min decremation, with any form of RAMS, including alternating supination and pronation of the forearm, hand opening and closing, finger taps, heel taps and toe taps on the L Gait and Station: The patient has min difficulty arising out of a deep-seated chair without the use of the hands. The patient's stride length is slightly decreased but with good arm swing.     Total time spent on today's visit was 20 minutes, including both face-to-face time and nonface-to-face time.  Time included that spent on review of records (prior notes available to me/labs/imaging if pertinent), discussing treatment and goals, answering patient's questions and coordinating care.  Cc:  Prince Solian, MD

## 2021-07-02 ENCOUNTER — Encounter: Payer: Self-pay | Admitting: Neurology

## 2021-07-02 ENCOUNTER — Ambulatory Visit (INDEPENDENT_AMBULATORY_CARE_PROVIDER_SITE_OTHER): Payer: No Typology Code available for payment source | Admitting: Neurology

## 2021-07-02 ENCOUNTER — Other Ambulatory Visit: Payer: Self-pay

## 2021-07-02 VITALS — BP 124/62 | HR 59 | Ht 74.0 in | Wt 225.0 lb

## 2021-07-02 DIAGNOSIS — G20 Parkinson's disease: Secondary | ICD-10-CM

## 2021-07-02 MED ORDER — CARBIDOPA-LEVODOPA 25-100 MG PO TABS: ORAL_TABLET | ORAL | 2 refills | Status: DC

## 2021-07-02 MED ORDER — CARBIDOPA-LEVODOPA ER 50-200 MG PO TBCR: 1.0000 | EXTENDED_RELEASE_TABLET | Freq: Every day | ORAL | 2 refills | Status: DC

## 2021-07-02 MED ORDER — PRAMIPEXOLE DIHYDROCHLORIDE ER 1.5 MG PO TB24: 1.0000 | ORAL_TABLET | Freq: Every day | ORAL | 1 refills | Status: DC

## 2021-07-02 NOTE — Patient Instructions (Signed)
Online Resources for Power over Parkinson's Group November 2022  Local [place] Online Groups  Power over [employer] Group :   Power Over Parkinson's Patient Education Group will be Wednesday, November 9th-*Hybrid meting*- in person at [place] location and via [facility] at 2:00 pm.   Upcoming Power over Parkinson's Meetings:  2nd Wednesdays of the month at 2 pm:  November 9th, December 14th [place] at [email or web address] if interested in participating in this online group Parkinson's Care Partners Group:    3rd Mondays, Contact Misty Paladino Atypical Parkinsonian Patient Group:   4th Wednesdays, [place] If you are interested in participating in these online groups with Misty, please contact her directly for how to join those meetings.  Her contact information is [email or web address].   [place]:  [email or web address] PD Health at Home continues:  Mindfulness Mondays, Expert Briefing Tuesdays, Wellness Wednesdays, Take Time Thursdays, Fitness Fridays -Listings for June 2022 are on the website Upcoming Webinar:  Expert Briefing:  Let's Talk about Dementia.  Wednesday, November 2nd  at 1 pm. Register for Armed forces operational officer) at [email or web address]  Please check out their website to sign up for emails and see their full online offerings  [place]:  [email or web address]  Upcoming Webinar:   2022 in Review:  Progress Toward Better Treatment and Prevention.  Thursday, November 17th at 12 noon Check out additional information on their website to see their full online offerings  [place]:  [email or web address] Upcoming Webinar:  NOH (Neurogenic Orthostatic Hypotension):  What it is, How to Know if you Have it, and How to Manage it.  Tuesday, November 15th at 3 pm.  Webinar Series:  Living with Parkinson's Meetup.    Third Thursdays of each month at 3 pm; next one is November 17th Care Partner Monthly Meetup.  With Robin Searing Phinney.  First Tuesday of each month, 2 pm Joy Breaks:  First Wednesday of each month, 2-3 pm. There will be art, doodling, making, crafting, listening, laughing, stories, and everything in between. No art experience necessary. No supplies required. Just show up for joy!  Register on their website. Check out additional information to Live Well Today on their website  Parkinson and Movement Disorders (PMD) Alliance:  www.[email or web address] [employer]Life Online:  Online Education Events Sign up for emails, which are sent weekly to give you updates on programming and online offerings  Parkinson's Association of the [place]:  www.[email or web address] Information on online support groups, education events, and online exercises including Yoga, Parkinson's exercises and more-LOTS of information on links to PD resources and online events Virtual Support Group through Parkinson's Association of the [place]; next one is scheduled for Wednesday, November 2nd  at 2 pm.  (These are typically scheduled for the 1st Wednesday of the month at 2 pm).  Visit website for details.  Additional links for movement activities: Parkinson's DRUMMING Classes/Music Therapy with Doylene Canning:  This is a returning class and it's FREE!  2nd Mondays, continuing November 14th.  Contact *Misty Taylor-Paladino at [email or web address] or Doylene Canning at [phone] or [email or web address]  PWR! Moves Classes at [place].  Wednesdays 10 and 11 am.  Contact Amy Marriott, PT [email or web address] if interested. Here is a link to the PWR!Moves classes on Zoom from [place] - Daily Mon-Sat at 10:00. Via Zoom, FREE and open to all.  There is also a link below via Facebook if you use that  platform. [email or web address].si [email or web address].no Parkinson's Wellness Recovery (PWR! Moves)  www.pwr4life.org Info on  the PWR! Virtual Experience:  You will have access to our expertise through self-assessment, guided plans that start with the PD-specific fundamentals, educational content, tips, Q&A with an expert, and a growing Art therapist of PD-specific pre-recorded and live exercise classes of varying types and intensity - both physical and cognitive! If that is not enough, we offer 1:1 wellness consultations (in-person or virtual) to personalize your PWR! Research scientist (medical).  [place] Fridays:  As part of the PD Health @ Home program, this free video series focuses each week on one aspect of fitness designed to support people living with Parkinson's.  These weekly videos highlight the [place] recent fitness guidelines for people with Parkinson's disease.  HollywoodSale.dk Dance for PD website is offering free, live-stream classes throughout the week, as well as links to [employer] of classes:  [email or web address]/ Dance for Parkinson's Class:  [place].  Free offering for people with Parkinson's and care partners; virtual class.  For more information, contact [phone] or email Ruffin Frederick at [email or web address] Virtual dance and Pilates for Parkinson's classes: Click on the Community Tab> Parkinson's Movement Initiative Tab.  To register for classes and for more information, visit www.[email or web address].co.[email or web address] and click the "community" tab.  [employer] Parkinson's Cycling Classes  [employer]: 1pm on Fridays-Live classes at [employer] ([employer] at  [email or web address] or [phone]) [employer]: Virtual Classes Mondays and Thursdays Jeanette Caprice classes Tuesday, Wednesday and Thursday (contact [place] at [email or web address]  or [phone]) [place] Varied levels of classes are offered Mondays, Tuesdays and Thursdays at [facility].  To observe a class or for more information, call [phone] or email [email or web address] [employer]: Online Caregiver Education Opportunities:  www.well-[email or web address]/caregiver-education/caregiver-support-group.  You may also contact Vickki Muff at [email or web address] or [phone].   *Multiple opportunities below, as November is Caregiver Month!* A Guide to Physical and Mental Fitness for Family Caregivers.  Wednesday, November 9th, 12:30-2 pm at [facility] of You!  Tuesday, November 15th, 11:30-12:45.  [facility] [place] 02-20-1992 Understanding Care Options and Advanced Care Planning.  Monday, November 21st, 4:30-5:45.  [place], [place] above to register. Well-Spring Navigator:  [address] program, a free service to help individuals and families through the journey of determining care for older adults.  The "Navigator" is a [employer], Education officer, museum, who will speak with a prospective client and/or loved ones to provide an assessment of the situation and a set of recommendations for a personalized care plan -- all free of charge, and whether [employer] offers the needed service or not. If the need is not a service we provide, we are well-connected with reputable programs in town that we can refer you to.  www.well-springsolutions.org or to speak with the Navigator, call [phone].

## 2021-07-18 ENCOUNTER — Ambulatory Visit (INDEPENDENT_AMBULATORY_CARE_PROVIDER_SITE_OTHER): Payer: No Typology Code available for payment source | Admitting: Neurology

## 2021-07-18 ENCOUNTER — Other Ambulatory Visit: Payer: Self-pay

## 2021-07-18 DIAGNOSIS — K117 Disturbances of salivary secretion: Secondary | ICD-10-CM | POA: Diagnosis not present

## 2021-07-18 MED ORDER — RIMABOTULINUMTOXINB 5000 UNIT/ML IM SOLN
5000.0000 [IU] | Freq: Once | INTRAMUSCULAR | Status: AC
  Administered 2021-07-18: 5000 [IU] via INTRAMUSCULAR

## 2021-07-18 NOTE — Procedures (Signed)
Botulinum Clinic    History:  Diagnosis: Sialorrhea    Result History  Working well.  Started wearing off about 1 week ago  Consent obtained from: The patient The patient was educated on the botulinum toxin the black blox warning and given a copy of the botox patient medication guide.  The patient understands that this warning states that there have been reported cases of the Botox extending beyond the injection site and creating adverse effects, similar to those of botulism. This included loss of strength, trouble walking, hoarseness, trouble saying words clearly, loss of bladder control, trouble breathing, trouble swallowing, diplopia, blurry vision and ptosis. Most of the distant spread of Botox was happening in patients, primarily children, who received medication for spasticity or for cervical dystonia. The patient expressed understanding and desire to proceed.     Injections  Location Left  Right Units Number of sites  Submandibular gland 250 250 500 1 per side  Parotid 2250 2250 2500 1 per side  TOTAL UNITS:     5000      Type of Toxin: Myobloc type B As ordered and injected IM at today's visit Total Units: 5000  Discarded Units: 0  Needle drawback with each injection was free of blood. Pt tolerated procedure well without complications.   Reinjection is anticipated in 3 months.

## 2021-07-21 ENCOUNTER — Telehealth: Payer: Self-pay | Admitting: Neurology

## 2021-07-21 NOTE — Telephone Encounter (Signed)
Patient called and said the [facility] needs a reauthorization of services form faxed to them. It will need visit notes sent as well.   He said the referral expired 11/13/20.  The [facility] is declining [facility] until this gets taken care of.  Call this nurse at the [facility] if more than 5 days pass in between now and then:  Huntley Dec, RN [phone], ext [phone]  Fax: [phone]

## 2021-07-21 NOTE — Telephone Encounter (Signed)
Form ready to be [facility]ed to [facility] need [facility] Tat to sign

## 2021-07-21 NOTE — Telephone Encounter (Signed)
Forms faxed at 2:45 to fax number [phone]

## 2021-07-21 NOTE — Telephone Encounter (Signed)
Huntley Dec RN phone number [phone] called left a voice mail informed that  referral paper and last office note was faxed

## 2021-08-05 ENCOUNTER — Telehealth: Payer: Self-pay | Admitting: Neurology

## 2021-08-05 NOTE — Telephone Encounter (Signed)
Patient called in today to make sure the community care requirement  paperwork for the [facility] has been filled out so that they will pay for his visits for Tat. He called the [place] today and they state that we have to fill that form out and send it back in to the [place].   Please check on this and let him know if it has been done and when it was sent in or if it has not been done when it can be done and sent in to the [place] for him.   He got a bill for the date of service in May

## 2021-08-05 NOTE — Telephone Encounter (Signed)
Called patient and did not receive an answer left voicemail message we did send in the paperwork via fax and e-mail to a dana weaver at the [place] on Jul 21 2021 and that we have done what we needed to do that the [place] has not completed the paperwork on there end.

## 2021-09-30 ENCOUNTER — Telehealth: Payer: Self-pay

## 2021-09-30 NOTE — Telephone Encounter (Signed)
New message   Lavoris Sparling Key: [id number] - PA Case ID: [id number] Need help? Call us at [phone]  Outcome Additional Information Required  This medication or product was previously approved on [id number] from 2021-06-24 to 2022-08-16. **Please note: This request was submitted electronically. Formulary lowering, tiering exception, cost reduction and/or pre-benefit determination review (including prospective Medicare hospice reviews) requests cannot be requested using this method of submission. Providers contact us at [phone] for further assistance.  Drug Myobloc 5000UNIT/ML solution Form [employer]um[employer] Medicare Part D Electronic Prior Authorization Form (2017 [place])

## 2021-10-17 ENCOUNTER — Ambulatory Visit (INDEPENDENT_AMBULATORY_CARE_PROVIDER_SITE_OTHER): Payer: No Typology Code available for payment source | Admitting: Neurology

## 2021-10-17 ENCOUNTER — Other Ambulatory Visit: Payer: Self-pay

## 2021-10-17 DIAGNOSIS — K117 Disturbances of salivary secretion: Secondary | ICD-10-CM | POA: Diagnosis not present

## 2021-10-17 MED ORDER — RIMABOTULINUMTOXINB 5000 UNIT/ML IM SOLN
5000.0000 [IU] | Freq: Once | INTRAMUSCULAR | Status: AC
  Administered 2021-10-17: 5000 [IU] via INTRAMUSCULAR

## 2021-10-17 NOTE — Procedures (Signed)
Botulinum Clinic  ?  ?History:  ?Diagnosis: Sialorrhea  ?  ?Result History  ?Working well.  Wears off just at about 3 months time ? ?Consent obtained from: The patient ?The patient was educated on the botulinum toxin the black blox warning and given a copy of the botox patient medication guide.  The patient understands that this warning states that there have been reported cases of the Botox extending beyond the injection site and creating adverse effects, similar to those of botulism. This included loss of strength, trouble walking, hoarseness, trouble saying words clearly, loss of bladder control, trouble breathing, trouble swallowing, diplopia, blurry vision and ptosis. Most of the distant spread of Botox was happening in patients, primarily children, who received medication for spasticity or for cervical dystonia. The patient expressed understanding and desire to proceed. ?  ?  ?Injections  ?Location Left  Right Units Number of sites  ?Submandibular gland 250 250 500 1 per side  ?Parotid 2250 2250 2500 1 per side  ?TOTAL UNITS:     5000    ?  ?Type of Toxin: Myobloc type B ?As ordered and injected IM at today's visit Total Units: 5000  ?Discarded Units: 0  ?Needle drawback with each injection was free of blood. ?Pt tolerated procedure well without complications.   ?Reinjection is anticipated in 3 months. ?  ?  ?  ? ?  ?  ?  ?  ? ?

## 2021-12-09 ENCOUNTER — Ambulatory Visit: Payer: No Typology Code available for payment source | Attending: Internal Medicine | Admitting: Physical Therapy

## 2021-12-09 ENCOUNTER — Telehealth: Payer: Self-pay | Admitting: Occupational Therapy

## 2021-12-09 ENCOUNTER — Ambulatory Visit: Payer: No Typology Code available for payment source

## 2021-12-09 ENCOUNTER — Ambulatory Visit: Payer: No Typology Code available for payment source | Admitting: Occupational Therapy

## 2021-12-09 DIAGNOSIS — R29818 Other symptoms and signs involving the nervous system: Secondary | ICD-10-CM | POA: Insufficient documentation

## 2021-12-09 DIAGNOSIS — R471 Dysarthria and anarthria: Secondary | ICD-10-CM

## 2021-12-09 DIAGNOSIS — R278 Other lack of coordination: Secondary | ICD-10-CM | POA: Insufficient documentation

## 2021-12-09 NOTE — Therapy (Addendum)
[facility] ?[place] ?[place], [place], [address] ?Phone: [phone]   Fax:  [phone] ? ?Patient Details  ?Name: Zachary Wang. ?MRN: [id number] ?Date of Birth: Aug 23, 1940 ?Referring Provider: Alonza Bogus DO ? ?Encounter Date: 12/09/2021 ? ?Speech Therapy Parkinson's Disease Screen  ? ?Decibel Level today: upper 60s dB  (WNL=70-72 dB) with sound level meter 30cm away from pt's mouth. Pt's conversational volume has declined since last treatment course. Pt denies any concern with his speech at this time. Pt denied difficulty being understood or having others request repetition at home or in functional conversation.  ? ?Pt does not report difficulty with swallowing or cognition warranting further evaluation. ? ?Pt does not feel he requires speech therapy services at this time; he deferred ST evaluation and treatment at this time. Recommend ST screen in another 6-8 months. ? ?Marzetta Board, CCC-SLP ?12/09/2021, 5:04 PM ? ? ?[place] ?[place], [place], [address] ?Phone: [phone]   Fax:  [phone] ?

## 2021-12-09 NOTE — Therapy (Signed)
[facility] ?[place] ?[place], [place], [address] ?Phone: [phone]   Fax:  [phone] ? ?Patient Details  ?Name: Zachary Wang. ?MRN: [id number] ?Date of Birth: [birth year] ?Referring Provider:  Prince Solian, MD ? ?Encounter Date: 12/09/2021 ? ?Physical Therapy Parkinson's Disease Screen ? ? ?Timed Up and Go test:8.2 seconds (previously 8.5 seconds) ? ?10 meter walk test:8.7 seconds = 3.77 ft/sec (previously 3.7 ft/sec) ? ?5 time sit to stand test:11.5 seconds (previously 13.35 seconds) ? ?No falls. Has been playing golf, doing yoga, and doing the PWR moves classes every week. Pt feels like he would really benefit from OT.  ? ?Patient does not require Physical Therapy services at this time.  Recommend Physical Therapy screen in 6 months.  ? ? ?Arliss Journey, PT, DPT  ?12/09/2021, 1:35 PM ? ?[facility] ?[place] ?[place], [place], [address] ?Phone: [phone]   Fax:  [phone] ?

## 2021-12-09 NOTE — Telephone Encounter (Signed)
Dr. Carles Collet,  ? ?Zachary Wang was seen for Parkinsons OT, PT, and ST screens 12/09/21.   Pt may benefit from OT referral (due to decline in L hand coordination).   If you are in agreement, please send updated order for Occupational therapy to our [place] location via epic. ? ?Thank you, ? ?Vianne Bulls, OTR/L ?[place] ?[place], [place]  [address] ?[phone] phone ?[phone] ?12/09/21 2:50 PM ? ? ?

## 2021-12-09 NOTE — Therapy (Signed)
[facility] ?[place] ?[place], [place], [address] ?Phone: [phone]   Fax:  [phone] ? ?Patient Details  ?Name: Zachary Wang. ?MRN: [id number] ?Date of Birth: 22-Nov-1940 ?Referring Provider:  Prince Solian, MD ? ?Encounter Date: 12/09/2021 ? ? ?Occupational Therapy Parkinson's Disease Screen ? ?Hand dominance:  right  ? ?Fastening/unfastening 3 buttons in:  42.60 sec ? ?9-hole peg test:    RUE  30.0 sec        LUE  46.35 sec shoulder compensation ? ?Box & Blocks Test:   RUE  50 blocks        LUE  45 blocks ? ?Change in ability to perform ADLs/IADLs:  difficulty with buttons ? ?Other Comments:  doing PWR! Moves classes with Amy.  R shoulder flex 130* with elbow ext WNL, L shoulder flex 120* with elbow ext WNL ? ?Pt would benefit from occupational therapy evaluation due to  decr L hand coordination ? ? ? Vianne Bulls, [place] ?12/09/2021, 1:21 PM ? ?[place] ?[place] ?[place], [place], [address] ?Phone: [phone]   Fax:  [phone] ? ? ?Vianne Bulls, OTR/L ?[place] ?[place], [place]  [address] ?[phone] phone ?[phone] ?12/09/21 1:16 PM ? ? ?

## 2021-12-16 NOTE — Progress Notes (Signed)
? ? ?Assessment/Plan:  ? ?1.  Parkinsons Disease ? -Continue carbidopa/levodopa 25/100, 2/2/1 ? -Continue carbidopa/levodopa 50/200 at bedtime for cramping of feet/legs ? -Continue pramipexole ER, 1.5 mg daily.  No compulsive behaviors.  ? -Genetic testing completed.  Heterozygous for GBA mutation.  This could provide a risk factor for inherited Parkinson's, but likely would also require an environmental component.  This is different from homozygous carrier, which would have a much greater risk factor (10-20 times). ? -Referred to occupational therapy. ? ? ?2.  History of malignant melanoma ? -Follows with dermatology. Sees derm regularly ? -We discussed that it used to be thought that levodopa would increase risk of melanoma but now it is believed that Parkinsons itself likely increases risk of melanoma. he is to get regular skin checks. ? ?3.  Sialorrhea ? -better with myobloc.  Last injection March 3. ? ? ?Subjective:  ? ?Zachary Wang. was seen today in follow up for Parkinsons disease.  My previous records were reviewed prior to todays visit as well as outside records available to me. Pt denies falls.  Continues to do Botox for sialorrhea, which works fairly well.  His last injection was March 3.  I got a note recently from occupational therapy requesting a referral after his therapy screen. ? ?Current prescribed movement disorder medications: ?Carbidopa/levodopa 25/100, 2/2/1 ?Pramipexole ER, 1.5 mg daily ?Carbidopa/levodopa 50/200 CR at bedtime  ? ?ALLERGIES:  No Known Allergies ? ?CURRENT MEDICATIONS:  ?Outpatient Encounter Medications as of 12/18/2021  ?Medication Sig  ? amLODipine (NORVASC) 5 MG tablet Take 5 mg by mouth every morning.  ? atorvastatin (LIPITOR) 20 MG tablet Take 20 mg by mouth every evening.   ? carbidopa-levodopa (SINEMET CR) 50-200 MG tablet Take 1 tablet by mouth at bedtime.  ? carbidopa-levodopa (SINEMET IR) 25-100 MG tablet 2 at 7am, 2 at 11am, 1 at 4pm  ? hydrochlorothiazide  (HYDRODIURIL) 25 MG tablet Take 25 mg by mouth every morning.   ? losartan (COZAAR) 100 MG tablet Take 50 mg by mouth 2 (two) times daily.  ? Pramipexole Dihydrochloride 1.5 MG TB24 Take 1 tablet (1.5 mg total) by mouth at bedtime.  ? ?No facility-administered encounter medications on file as of 12/18/2021.  ? ? ?Objective:  ? ?PHYSICAL EXAMINATION:   ? ?VITALS:   ?Vitals:  ? 12/18/21 0835  ?BP: 131/80  ?Pulse: (!) 57  ?SpO2: 99%  ?Weight: 222 lb 6.4 oz (100.9 kg)  ?Height: '6\' 2"'$  (1.88 m)  ? ? ? ? ?GEN:  The patient appears stated age and is in NAD. ?HEENT:  Normocephalic, atraumatic.  The mucous membranes are moist. The superficial temporal arteries are without ropiness or tenderness. ?CV:  RRR ?Lungs:  CTAB ?Neck/HEME:  There are no carotid bruits bilaterally. ? ?Neurological examination: ? ?Orientation: The patient is alert and oriented x3. ?Cranial nerves: There is good facial symmetry with facial hypomimia. The speech is fluent and clear. Soft palate rises symmetrically and there is no tongue deviation. Hearing is intact to conversational tone. ?Sensation: Sensation is intact to light touch throughout ?Motor: Strength is at least antigravity x4. ? ?Movement examination: ?Tone: There is mild increased tone in the UE bilaterally, L>R ?Abnormal movements: none ?Coordination:  There is min decremation, with any form of RAMS, including alternating supination and pronation of the forearm, hand opening and closing, finger taps, heel taps and toe taps on the L ?Gait and Station: The patient has min difficulty arising out of a deep-seated chair without the use  of the hands. The patient's stride length is slightly decreased but with good arm swing.  This is stable ? ? ?Tot ? ?Cc:  Prince Solian, MD ? ?

## 2021-12-18 ENCOUNTER — Encounter: Payer: Self-pay | Admitting: Neurology

## 2021-12-18 ENCOUNTER — Ambulatory Visit (INDEPENDENT_AMBULATORY_CARE_PROVIDER_SITE_OTHER): Payer: No Typology Code available for payment source | Admitting: Neurology

## 2021-12-18 VITALS — BP 131/80 | HR 57 | Ht 74.0 in | Wt 222.4 lb

## 2021-12-18 DIAGNOSIS — G20 Parkinson's disease: Secondary | ICD-10-CM | POA: Diagnosis not present

## 2021-12-18 MED ORDER — PRAMIPEXOLE DIHYDROCHLORIDE ER 1.5 MG PO TB24: 1.0000 | ORAL_TABLET | Freq: Every day | ORAL | 2 refills | Status: DC

## 2021-12-18 NOTE — Patient Instructions (Signed)
Local and Online Resources for Power over Parkinson's Group ?May 2023 ? ?LOCAL [place] PARKINSON'S GROUPS  ?Power over Parkinson's Group:   ?Power Over Parkinson's Patient Education Group will be Wednesday, May 10th-*Hybrid meting*- in person at [facility] location and via [facility] at 2:00 pm.   ?Upcoming Power over Parkinson's Meetings:  2nd Wednesdays of the month at 2 pm:  May 10th, June 14th, July 12th ?Contact Amy Marriott at [email or web address] if interested in participating in this group ?Parkinson's Care Partners Group:    3rd Mondays, Contact Misty Paladino ?Atypical Parkinsonian Patient Group:   4th Wednesdays, Contact Misty Paladino ?If you are interested in participating in these groups with Misty, please contact her directly for how to join those meetings.  Her contact information is [email or web address].   ? ?LOCAL EVENTS AND NEW OFFERINGS ?Moving Day [place]:  Saturday, May 6th, 9:30 am at [place], [place], [place]. Participate in Moving Day as a way to ?honor loved ones, raise funds, fight Parkinson's disease, and celebrate movement.?  Register today at [email or web address] ?[place]!  Play [place]!  Join [place] for home game for a fun evening to bring awareness of Parkinson's and raise funds for our Movement Disorder Funds. Rescheduled to May 11th  6:30 pm [place]. To purchase tickets:  [email or web address] ?Parkinson's T-shirts for sale!  Designed by a local group member, with funds going to [place].  $25.00  Contact Misty to purchase  ?New PWR! Moves [employer] Instructor-Led Class offering at [employer]!  Wednesdays 1-2 pm, starting April 12th.   Contact Bryson Dames, Acupuncturist at [employer].  [email or web address] [email or web address] ? ?ONLINE EDUCATION AND SUPPORT ?[place]:  www.[email or web address] ?PD Health at Home continues:  Mindfulness  Mondays, Wellness Wednesdays, Fitness Fridays  ?Upcoming Education:  ?Understanding Gene and Cell-Based Therapies in Parkinson's.  Wednesday, May 10th at 1:00 pm ?Additional Education offerings virtually through their website-upcoming topics include Palliative Care/Hospice and PD, Sleep and PD ?Register for expert briefings Cytogeneticist) at [email or web address].si ?Please check out their website to sign up for emails and see their full online offerings ? ? ?[place]:  [email or web address]  ?Third Thursday Webinars:  On the third Thursday of every month at 12 p.m. ET, join our free live webinars to learn about various aspects of living with Parkinson's disease and our work to speed medical breakthroughs. ?Upcoming Webinar: Get Moving: Exercising for a Healthy Brain.  Thursday, May 18th  at  12 noon. ?Check out additional information on their website to see their full online offerings ? ?[place]:  www.[email or web address] ?Upcoming Webinar:   Stay tuned ?Webinar Series:  Living with Parkinson's Meetup.   Third Thursdays each month, 3 pm ?Care Partner Monthly Meetup.  With Robin Searing [place].  First Tuesday of each month, 2 pm ?Check out additional information to Live Well Today on their website ? ?Parkinson and Movement Disorders (PMD) Alliance:  www.pmdalliance.org ?[employer]uroLife Online:  Online Education Events ?Sign up for emails, which are sent weekly to give you updates on programming and online offerings ? ?Parkinson's Association of the [place]:  www.parkinsonassociation.org ?Information on online support groups, education events, and online exercises including Yoga, Parkinson's exercises and more-LOTS of information on links to PD resources and online events ?Virtual Support Group through [employer] of the [place]; next one is scheduled for Wednesday, May 3rd at 2 pm. (These are typically  scheduled for the 1st Wednesday of the month at 2 pm).  Visit website for details. ?MOVEMENT AND  EXERCISE OPPORTUNITIES ?Parkinson's DRUMMING Classes/Music Therapy with Doylene Canning:  This is a returning class and it's FREE!  2nd Mondays, continuing May 8th, 11:00 at the [place].  Contact *Misty Taylor-Paladino at [email or web address] or Doylene Canning at [phone] or [email or web address]  ?PWR! Moves Classes at [place].  Wednesdays 10 and 11 am.   Contact Amy Marriott, PT [email or web address] if interested. ?NEW PWR! Moves Class offering at [employer].  Wednesdays 1-2 pm, starting April 12th.  Contact Bryson Dames, Acupuncturist at [employer].  [email or web address] [email or web address] ?Here is a link to the PWR!Moves classes on Zoom from [place] - Daily Mon-Sat at 10:00. Via Zoom, FREE and open to all.  There is also a link below via Facebook if you use that platform. ? ?AptDealers.si ?https://www.PrepaidParty.no ? ?Parkinson's Wellness Recovery (PWR! Moves)  www.pwr4life.org ?Info on the PWR! Virtual Experience:  You will have access to our expertise through self-assessment, guided plans that start with the PD-specific fundamentals, educational content, tips, Q&A with an expert, and a growing Art therapist of PD-specific pre-recorded and live exercise classes of varying types and intensity - both physical and cognitive! If that is not enough, we offer 1:1 wellness consultations (in-person or virtual) to personalize your PWR! Research scientist (medical).  ?[employer] Fridays:  ?As part of the PD Health @ Home program, this free video series focuses each week on one aspect of fitness designed to support people living with  Parkinson's.  These weekly videos highlight the [place] recent fitness guidelines for people with Parkinson's disease. ?www.KVTVnet.com.cy ?Dance for PD website is offering free, live-stream classes throughout the week, as well as links to [employer] of classes:  https://danceforparkinsons.org/ ?Virtual dance and Pilates for Parkinson's classes: Click on the Community Tab> Parkinson's Movement Initiative Tab.  To register for classes and for more information, visit www.SeekAlumni.co.za and click the ?community? tab.  ?[employer] Parkinson's Cycling Classes  ?[employer]:  Thursdays @ Noon-Live classes at [employer] ([employer] at [email or web address] .[email or web address] or [phone]) ?[facility] [employer]: Virtual Classes Mondays and Thursdays Jeanette Caprice classes Tuesday, Wednesday and Thursday (contact [place] at [email or web address]  or [phone]) ?[employer] ?Varied levels of classes are offered Mondays, Tuesdays and Thursdays at [employer].  ?Stretching with Verdis Frederickson weekly class is also offered for people with Parkinson's ?To observe a class or for more information, call [phone] or email Hezzie Bump at [email or web address] .[email or web address] ?ADDITIONAL SUPPORT AND RESOURCES ?[employer]:Online Caregiver Education Opportunities:  www.well-springsolutions.org/caregiver-education/caregiver-support-group.  You may also contact Vickki Muff at [email or web address] or [phone].    ?Well-Spring Navigator:  Just1Navigator program, a free service to help individuals and families through the journey of determining care for older adults.  The ?Navigator? is a [phone], Education officer, museum, who will speak with a prospective client and/or loved ones to provide an assessment of the situation and a set of recommendations for a personalized care plan -- all free of charge, and whether Well[employer]Spring [employer]  offers the needed service or not. If the need is not a service we provide, we are well-connected with reputable programs in town that we can refer you to.  www.well-springsolutions.org or to speak with the Navigator,

## 2022-01-13 ENCOUNTER — Telehealth: Payer: Self-pay | Admitting: Neurology

## 2022-01-13 NOTE — Telephone Encounter (Signed)
Called and spoke to patient and he needs [place] to send a [place] authorization form for Occupational therapy at [facility]. He has an upcoming [facility]t [facility] with [facility] on [facility]. Patient needs the authorization sent to [facility] fax: [phone].

## 2022-01-13 NOTE — Telephone Encounter (Signed)
Form has been faxed.

## 2022-01-13 NOTE — Telephone Encounter (Signed)
Form has been filled out. Pending signature then will fax.

## 2022-01-13 NOTE — Telephone Encounter (Signed)
Patient stated he would like a call back to discuss a referral. He would like a referral sent for occupational therapy

## 2022-01-16 ENCOUNTER — Ambulatory Visit (INDEPENDENT_AMBULATORY_CARE_PROVIDER_SITE_OTHER): Payer: No Typology Code available for payment source | Admitting: Neurology

## 2022-01-16 DIAGNOSIS — K117 Disturbances of salivary secretion: Secondary | ICD-10-CM

## 2022-01-16 IMAGING — MR MR PROSTATE WO/W CM
12 series · 48 of 48 positions shown · IV contrast (19 mL Multihance)
Comparison: CT pelvis 08/27/2015

CLINICAL DATA: Elevated PSA level of 6.32 on 03/12/2021.

EXAM:
MR PROSTATE WITHOUT AND WITH CONTRAST
TECHNIQUE: Multiplanar multisequence MRI images were obtained of the pelvis
centered about the prostate. Pre and post contrast images were
obtained.
CONTRAST:  20mL MULTIHANCE GADOBENATE DIMEGLUMINE 529 MG/ML IV SOLN

[Series 3: T2 · coronal · 3.0mm · 0.56mm/px · 1 of 23 slices shown (1 of 3)]
[im 1/23]
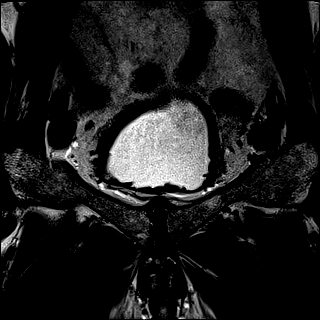

[Series 4: T1 · axial · 5.0mm · 1.25mm/px · z∈[-49,+206]mm · 2 of 104 slices shown]
[im 1/104]
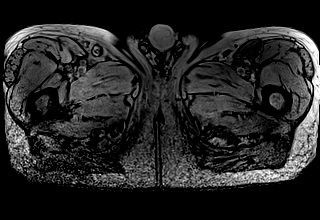
[im 104/104]
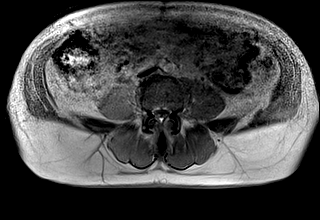

[Series 5: DWI · axial · 3.0mm · 1.75mm/px · 1 of 69 slices shown (1 of 3)]
[im 1/69]
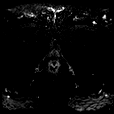

[Series 6: DWI · axial · 3.0mm · 1.75mm/px · 1 of 23 slices shown (2 of 3)]
[im 1/23]
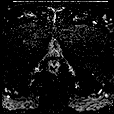

[Series 7: DWI · axial · 3.0mm · 1.75mm/px · 1 of 23 slices shown (3 of 3)]
[im 1/23]
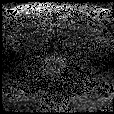

[Series 8: T2 · axial · 3.0mm · 0.56mm/px · 1 of 23 slices shown (2 of 3)]
[im 1/23]
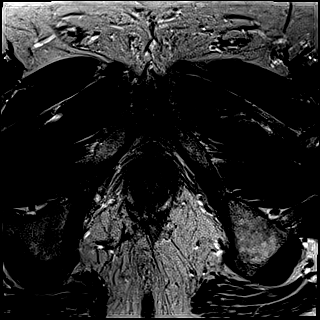

[Series 9: T2 · axial · 1.0mm · 1.04mm/px · z∈[-8,+63]mm · 2 of 72 slices shown (3 of 3)]
[im 1/72]
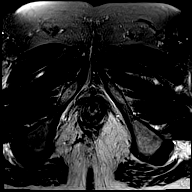
[im 72/72]
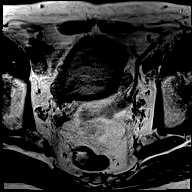

[Series 10: pre t1_twist_tra_dyn · axial · non-contrast · 3.5mm · 0.83mm/px · 1 of 20 slices shown]
[im 1/20]
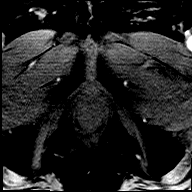

[Series 11: post t1_twist_tra_dyn-copy center · axial · non-contrast · 3.5mm · 0.83mm/px · z∈[-6,+61]mm · 16 of 600 slices shown]
[im 1/600]
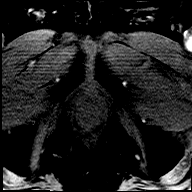
[im 40/600]
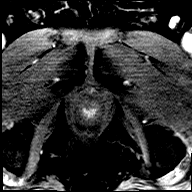
[im 80/600]
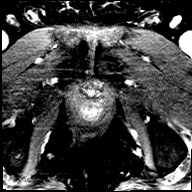
[im 120/600]
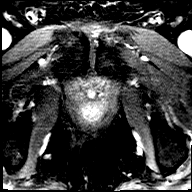
[im 160/600]
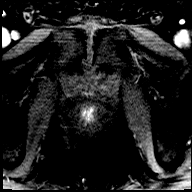
[im 200/600]
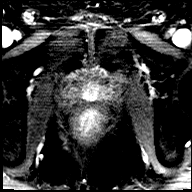
[im 240/600]
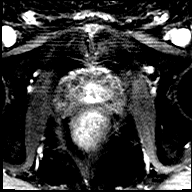
[im 280/600]
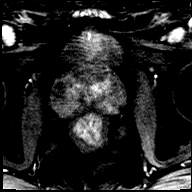
[im 320/600]
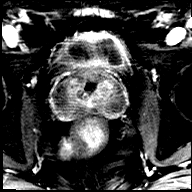
[im 360/600]
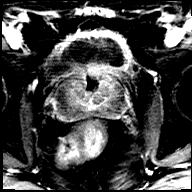
[im 400/600]
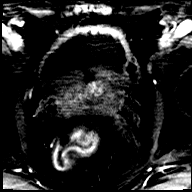
[im 440/600]
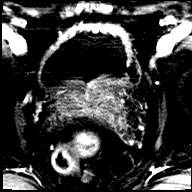
[im 480/600]
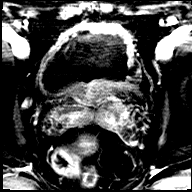
[im 520/600]
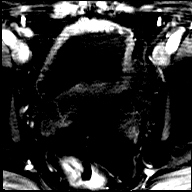
[im 560/600]
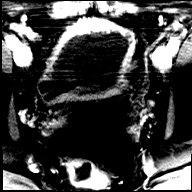
[im 600/600]
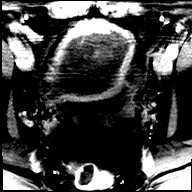

[Series 12: post t1_twist_tra_dyn-copy cent_sub · axial · 3.5mm · 0.83mm/px · z∈[-6,+61]mm · 16 of 580 slices shown]
[im 1/580]
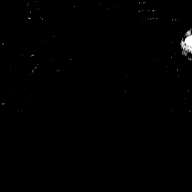
[im 39/580]
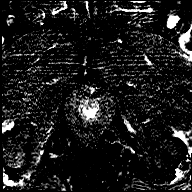
[im 78/580]
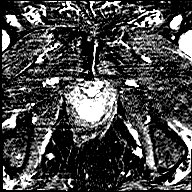
[im 116/580]
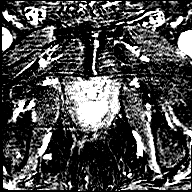
[im 155/580]
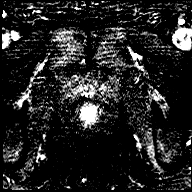
[im 194/580]
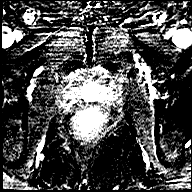
[im 232/580]
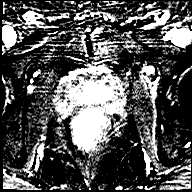
[im 271/580]
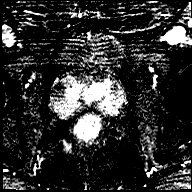
[im 309/580]
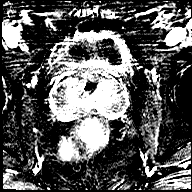
[im 348/580]
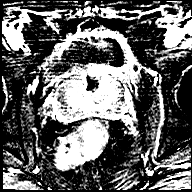
[im 387/580]
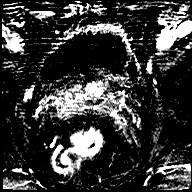
[im 425/580]
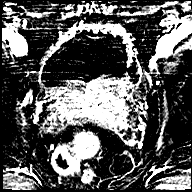
[im 464/580]
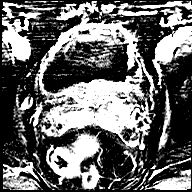
[im 502/580]
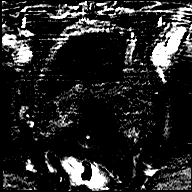
[im 541/580]
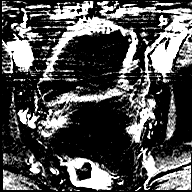
[im 580/580]
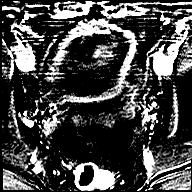

[Series 13: t1_vibe_dixon_tra_f · axial · 2.5mm · 1.14mm/px · z∈[-60,+217]mm · 3 of 107 slices shown]
[im 1/107]
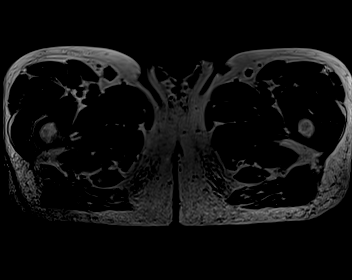
[im 54/107]
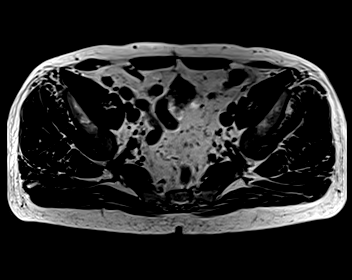
[im 107/107]
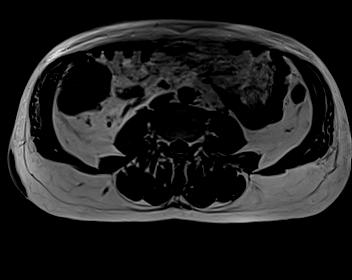

[Series 14: t1_vibe_dixon_tra_w · axial · 2.5mm · 1.14mm/px · z∈[-60,+217]mm · 3 of 112 slices shown]
[im 1/112]
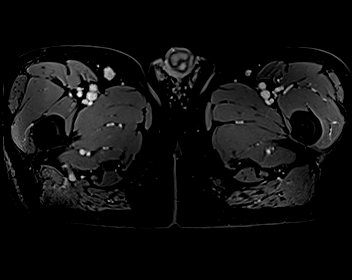
[im 56/112]
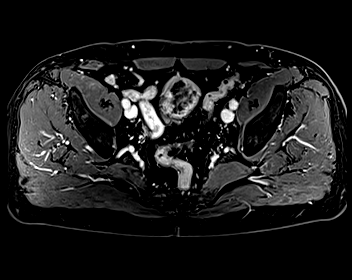
[im 112/112]
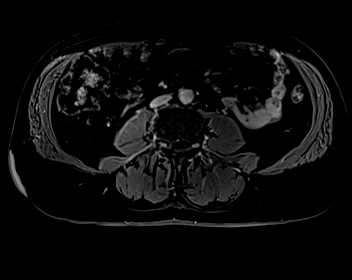

[48 of 48 positions shown; findings below may reference images not displayed]

FINDINGS: Prostate:

Prior TURP. Encapsulated nodularity in the transition zone
compatible with benign prostatic hypertrophy.

Region of [place] 1: PI-RADS category 3 lesion of the left
posterolateral and posteromedial peripheral zone at the apex and
adjacent mid gland, with reduced T2 signal but no focal early
enhancement or restricted diffusion. This measured 0.53 cm (1.7 by
1.1 by 0.7 cm) and is shown for example on image 39 series 9.

Region of interest # 2: PI-RADS category 2 lesion of the right
posteromedial and posterolateral peripheral zone in the mid gland
and apex, with reduced T2 signal but no significant early
enhancement or restricted diffusion. This measures 2.45 cubic cm
(2.2 by 1.2 by 2.3 cm) and is shown for example on image 31 series
9.

Volume: 3D volumetric analysis: Prostate volume 51.37 cubic cm (6.5
by 4.1 by 4.5 cm).

Transcapsular spread:  Absent

Seminal vesicle involvement: Absent

Neurovascular bundle involvement: Absent

Pelvic adenopathy: Absent

Bone metastasis: Absent

Other findings: Small diverticula/cellules of the urinary bladder
observed.
IMPRESSION: 1. Two PI-RADS category 3 lesions are present in the peripheral
zone. Targeting data sent to [facility].
2. Prior TURP. Mild underlying benign prostatic hypertrophy. Mild
prostatomegaly.

## 2022-01-16 MED ORDER — RIMABOTULINUMTOXINB 5000 UNIT/ML IM SOLN
5000.0000 [IU] | Freq: Once | INTRAMUSCULAR | Status: AC
  Administered 2022-01-16: 5000 [IU] via INTRAMUSCULAR

## 2022-01-16 NOTE — Procedures (Signed)
Botulinum Clinic    History:  Diagnosis: Sialorrhea    Result History  Working well.  Very little wearing off right now  Consent obtained from: The patient The patient was educated on the botulinum toxin the black blox warning and given a copy of the botox patient medication guide.  The patient understands that this warning states that there have been reported cases of the Botox extending beyond the injection site and creating adverse effects, similar to those of botulism. This included loss of strength, trouble walking, hoarseness, trouble saying words clearly, loss of bladder control, trouble breathing, trouble swallowing, diplopia, blurry vision and ptosis. Most of the distant spread of Botox was happening in patients, primarily children, who received medication for spasticity or for cervical dystonia. The patient expressed understanding and desire to proceed.     Injections  Location Left  Right Units Number of sites  Submandibular gland 250 250 500 1 per side  Parotid 2250 2250 2500 1 per side  TOTAL UNITS:     5000      Type of Toxin: Myobloc type B As ordered and injected IM at today's visit Total Units: 5000  Discarded Units: 0  Needle drawback with each injection was free of blood. Pt tolerated procedure well without complications.   Reinjection is anticipated in 3 months.

## 2022-01-19 ENCOUNTER — Ambulatory Visit: Payer: No Typology Code available for payment source | Admitting: Occupational Therapy

## 2022-03-09 NOTE — Therapy (Signed)
OUTPATIENT OCCUPATIONAL THERAPY PARKINSON'S EVALUATION  Patient Name: Zachary Wang. MRN: [id number] DOB:08/22/1940, 81 y.o., male Today's Date: 03/10/2022  PCP: Dr. Betsey Amen REFERRING PROVIDER: Dr. Wells Guiles Tat   OT End of Session - 03/10/22 1529     Visit Number 1    Number of Visits 17    Date for OT Re-Evaluation 06/08/22    Authorization Type [place] Auth  6/30-10/28/23    Authorization - Visit Number 1    Authorization - Number of Visits 10    Progress Note Due on Visit 10    OT Start Time 7893    OT Stop Time 0933    OT Time Calculation (min) 46 min    Activity Tolerance Patient tolerated treatment well    Behavior During Therapy [facility] for tasks assessed/performed             Past Medical History:  Diagnosis Date   Arthritis    hands   BPH (benign prostatic hyperplasia)    Elevated PSA    Foley catheter in place    History of adenomatous polyp of colon    2014  tubular adenoma   History of malignant melanoma    s/p  excision left forearm and left axill lymph node bx 09-10-2004   Hyperlipidemia    Hypertension    Peripheral vascular disease [facility])    Urinary retention    Past Surgical History:  Procedure Laterality Date   ANAL FISSURE REPAIR  1984   COLONOSCOPY N/A 05/02/2013   Procedure: COLONOSCOPY;  Surgeon: Garlan Fair, MD;  Location: [facility];  Service: Endoscopy;  Laterality: N/A;   INSERTION OF SUPRAPUBIC CATHETER N/A 09/30/2015   Procedure: INSERTION OF SUPRAPUBIC CATHETER;  Surgeon: Kathie Rhodes, MD;  Location: [facility];  Service: Urology;  Laterality: N/A;   SHOULDER ARTHROSCOPY W/ SUBACROMIAL DECOMPRESSION AND DISTAL CLAVICLE EXCISION Left 05-20-2010   w/  Debridement , Bursectomy, Acromioplasty,  Capsulectomy   TRANSURETHRAL RESECTION OF PROSTATE N/A 09/30/2015   Procedure: TURP (TRANSURETHRAL RESECTION OF PROSTATE WITH GYRUS;  Surgeon: Kathie Rhodes, MD;  Location: [place];  Service: Urology;   Laterality: N/A;   WIDE EXCISION MALIGNANT MELANOMA LEFT FOREARM/  LEFT AXILLA LYMPH NODE BX  09-10-2004   Patient Active Problem List   Diagnosis Date Noted   Parkinson's disease ([employer]) 04/13/2017   BPH (benign prostatic hypertrophy) with urinary retention 09/30/2015    ONSET DATE: 12/09/21 PD Screen date  REFERRING DIAG: G20 (ICD-10-CM) - Parkinson's disease ([place])   THERAPY DIAG:  Other lack of coordination  Other symptoms and signs involving the nervous system  Abnormal posture  Other symptoms and signs involving the musculoskeletal system  Unsteadiness on feet  Stiffness of right shoulder, not elsewhere classified  Tremor  Rationale for Evaluation and Treatment Rehabilitation  SUBJECTIVE:   SUBJECTIVE STATEMENT: Pt reports that he has noticed some changes with slowing and reports difficulty with excess tears that pool in his eyes (has follow-up for this with possible procedure).  Pt accompanied by: self  PERTINENT HISTORY: arthritis, hx of malignant melanoma, HDL, HTN, Peripheral vascular disease, hx of L shoudler arthroscopy 2011, bilateral cataract surgery 10/2019, hx of limited L wrist flex  PRECAUTIONS: Fall  WEIGHT BEARING RESTRICTIONS No  PAIN:  Are you having pain? No  Occasional R shoulder soreness after playing golf  FALLS: Has patient fallen in last 6 months? No  LIVING ENVIRONMENT: Lives with: lives with their spouse Lives in: House/apartment  PLOF: Independent,  Vocation/Vocational requirements: retired Personal assistant, and Leisure: [employer], PD cycling class, golf 2x/wk (min difficulty with putting golf ball on tee with tremors), occasional yoga class [facility])  PATIENT GOALS improve coordination, ADLs  OBJECTIVE:   HAND DOMINANCE: Right  ADLs:  Transfers/ambulation related to ADLs:  min difficulty getting up from low sofa, sinks into bed (gel mattress), getting more difficulty to get in/out car Eating: Pt reports difficulty with  thicker sandwiches, noodles, salad, difficulty opening containers (peeling cover off new containers) Grooming: mod I UB Dressing: difficulty pulling shirt down in the back, difficulty with buttons LB Dressing: difficulty putting on pants/leans, difficulty tying shoes (mostly wears slip-on shoes) Toileting: min difficulty getting up for toilet (uses [employer] Move) Bathing: mod I Tub Shower transfers: mod I Equipment: Grab bars and Walk in shower    IADLs: Light housekeeping: Pt reports difficulty picking up small things off floor.   Meal Prep: pt cooking breakfast (difficulty flipping egg), grills Community mobility:  Pt denies difficulty Medication management: mod I, difficulty opening bottles Financial management: difficulty typing/incr time and tremors (hunt and peck now) Handwriting: 100% legible and good size for 2 sentences (5 lines)  MOBILITY STATUS: Independent, start hesitation, and difficulty carrying objections with ambulation  POSTURE COMMENTS:  rounded shoulders and forward head  FUNCTIONAL OUTCOME MEASURES: Fastening/unfastening 3 buttons: 52.56sec Physical performance test: PPT#2 (simulated eating) holds spoon at the end, tremors noted 10.69sec & PPT#4 (donning/doffing jacket): 11.66sec  COORDINATION: 9 Hole Peg test: Right: 25.50 sec; Left: (uses shoulder for compensation and lean) 36.19 sec Box and Blocks:  NT due to time constraints   UE ROM:   R shoulder flex 110* with -20* elbow ext and L shoulder flex with 125* with elbow ext WNL  UE MMT:   Not tested  SENSATION: Not tested  MUSCLE TONE: not formally tested   COGNITION: Overall cognitive status: Within functional limits for tasks assessed  VISION:  Pt reports tears pooling in his eyes which affects vision at times.  Pt is following up with doctor for this and may have a procedure.  OBSERVATIONS: Bradykinesia   TODAY'S TREATMENT:  See pt education   PATIENT EDUCATION: Education details: OT eval  results/POC; Recommended pt discuss excess fluid in eyes with neurologist as well Person educated: Patient Education method: Explanation Education comprehension: verbalized understanding   HOME EXERCISE PROGRAM: Not yet issued  ASSESSMENT:  CLINICAL IMPRESSION: Patient is a 81 y.o. male who was seen today for occupational therapy evaluation for Parkinson's disease.   During multi-disciplinary PD screen on 12/09/21, occupational therapy was recommended due to decr L hand coordination.   Pt is familiar to this therapist and was last seen for OT 06/07/22.  Pt with PMH that includes arthritis, hx of malignant melanoma, HDL, HTN, Peripheral vascular disease, hx of L shoudler arthroscopy 2011, bilateral cataract surgery 10/2019, hx of limited L wrist flex.  Pt presents with bradykinesia, rigidity, decr coordination, abnormal posture, decr balance for ADLs, and decr ROM.  Pt would benefit from occupational therapy to address these deficits in order to improve ease and safety with ADL/IADL performance, improve quality of life, update PD-specific HEP, and decr risk of future complications.    PERFORMANCE DEFICITS in functional skills including ADLs, IADLs, coordination, tone, ROM, flexibility, FMC, GMC, mobility, balance, and UE functional use, and psychosocial skills including habits.   IMPAIRMENTS are limiting patient from ADLs, IADLs, and leisure.   COMORBIDITIES may have co-morbidities  that affects occupational performance. Patient will benefit  from skilled OT to address above impairments and improve overall function.  MODIFICATION OR ASSISTANCE TO COMPLETE EVALUATION: Min-Moderate modification of tasks or assist with assess necessary to complete an evaluation.  OT OCCUPATIONAL PROFILE AND HISTORY: Detailed assessment: Review of records and additional review of physical, cognitive, psychosocial history related to current functional performance.  CLINICAL DECISION MAKING: Moderate - several  treatment options, min-mod task modification necessary  REHAB POTENTIAL: Good  EVALUATION COMPLEXITY: Moderate     GOALS: Potential Goals reviewed with patient? Yes  SHORT TERM GOALS: Target date: 04/07/2022    Pt will be independent with updated PD-specific HEP.  Goal status: INITIAL  2.  Pt will demo at least 120* R shoulder flexion for overhead reaching. Baseline:  110*  Goal status: INITIAL  3.  Pt will improve L hand coordination for ADLs as shown by improving time on 9-hole peg test by at least 3sec.  Baseline:  36.19sec Goal status: INITIAL  4.  Pt will verbalize understanding of ways to decr risk of future complications related to PD. Goal status: INITIAL    LONG TERM GOALS: Target date: 06/02/2022    Pt will verbalize understanding of adaptive strategies to incr ease, safety, and independence with ADLs/IADLs (including eating, opening containers, sit>stand from various surfaces, picking up items from floor, dressing, putting golf ball on tee, flipping eggs).   Goal status: INITIAL  2.  Pt will demo at least 120* R shoulder flexion for overhead reaching with at least -15* elbow ext.  Baseline:  110* with -20* elbow ext Goal status: INITIAL  3.  Pt will improve L hand coordination for ADLs as shown by improving time on 9-hole peg test by at least 3sec.  Baseline:  36.19sec Goal status: INITIAL  4.  Pt will improve bilateral hand coordination as shown by fastening/unfastening 3 buttons in 48sec or less. Baseline:   52.56sec Goal status: INITIAL  5.  Assess blocks and blocks test and establish goal as appropriate. Goal status: INITIAL   PLAN: OT FREQUENCY: 2x/week  OT DURATION:  x8 weeks or 16 sessions + eval over 12 weeks (due to scheduling)  PLANNED INTERVENTIONS: self care/ADL training, therapeutic exercise, therapeutic activity, neuromuscular re-education, manual therapy, passive range of motion, balance training, functional mobility training, aquatic  therapy, electrical stimulation, ultrasound, fluidotherapy, moist heat, cryotherapy, patient/family education, and DME and/or AE instructions  RECOMMENDED OTHER SERVICES: none at this time  CONSULTED AND AGREED WITH PLAN OF CARE: Patient  PLAN FOR NEXT SESSION:   Assess blocks and blocks test and establish goal as appropriate, working on posture, functional reaching   [facility], OTR/L 03/10/2022, 3:30 PM

## 2022-03-10 ENCOUNTER — Ambulatory Visit: Payer: No Typology Code available for payment source | Attending: Internal Medicine | Admitting: Occupational Therapy

## 2022-03-10 ENCOUNTER — Encounter: Payer: Self-pay | Admitting: Occupational Therapy

## 2022-03-10 DIAGNOSIS — R2681 Unsteadiness on feet: Secondary | ICD-10-CM | POA: Diagnosis present

## 2022-03-10 DIAGNOSIS — R278 Other lack of coordination: Secondary | ICD-10-CM | POA: Diagnosis not present

## 2022-03-10 DIAGNOSIS — R251 Tremor, unspecified: Secondary | ICD-10-CM | POA: Diagnosis present

## 2022-03-10 DIAGNOSIS — R29898 Other symptoms and signs involving the musculoskeletal system: Secondary | ICD-10-CM | POA: Insufficient documentation

## 2022-03-10 DIAGNOSIS — M25611 Stiffness of right shoulder, not elsewhere classified: Secondary | ICD-10-CM | POA: Insufficient documentation

## 2022-03-10 DIAGNOSIS — R29818 Other symptoms and signs involving the nervous system: Secondary | ICD-10-CM | POA: Insufficient documentation

## 2022-03-10 DIAGNOSIS — R293 Abnormal posture: Secondary | ICD-10-CM | POA: Diagnosis present

## 2022-03-23 ENCOUNTER — Ambulatory Visit: Payer: No Typology Code available for payment source | Attending: Internal Medicine | Admitting: Occupational Therapy

## 2022-03-23 ENCOUNTER — Encounter: Payer: Self-pay | Admitting: Occupational Therapy

## 2022-03-23 DIAGNOSIS — R293 Abnormal posture: Secondary | ICD-10-CM | POA: Diagnosis present

## 2022-03-23 DIAGNOSIS — R29818 Other symptoms and signs involving the nervous system: Secondary | ICD-10-CM | POA: Insufficient documentation

## 2022-03-23 DIAGNOSIS — R29898 Other symptoms and signs involving the musculoskeletal system: Secondary | ICD-10-CM | POA: Diagnosis present

## 2022-03-23 DIAGNOSIS — M25611 Stiffness of right shoulder, not elsewhere classified: Secondary | ICD-10-CM | POA: Diagnosis present

## 2022-03-23 DIAGNOSIS — R278 Other lack of coordination: Secondary | ICD-10-CM | POA: Diagnosis not present

## 2022-03-23 DIAGNOSIS — M25612 Stiffness of left shoulder, not elsewhere classified: Secondary | ICD-10-CM | POA: Insufficient documentation

## 2022-03-23 DIAGNOSIS — R471 Dysarthria and anarthria: Secondary | ICD-10-CM | POA: Insufficient documentation

## 2022-03-23 DIAGNOSIS — R2681 Unsteadiness on feet: Secondary | ICD-10-CM | POA: Insufficient documentation

## 2022-03-23 DIAGNOSIS — R251 Tremor, unspecified: Secondary | ICD-10-CM | POA: Diagnosis present

## 2022-03-23 NOTE — Therapy (Signed)
OUTPATIENT OCCUPATIONAL THERAPY PARKINSON'S TREATMENT  Patient Name: Zachary Wang. MRN: [id number] DOB:09-Mar-1941, 81 y.o., male Today's Date: 03/23/2022  PCP: Dr. Betsey Amen REFERRING PROVIDER: Dr. Wells Guiles Tat   OT End of Session - 03/23/22 0852     Visit Number 2    Number of Visits 17    Date for OT Re-Evaluation 06/08/22    Authorization Type [place] Auth  6/30-10/28/23    Authorization - Visit Number 2    Authorization - Number of Visits 10    Progress Note Due on Visit 10    OT Start Time 0851    OT Stop Time 0933    OT Time Calculation (min) 42 min    Activity Tolerance Patient tolerated treatment well    Behavior During Therapy [facility] for tasks assessed/performed              Past Medical History:  Diagnosis Date   Arthritis    hands   BPH (benign prostatic hyperplasia)    Elevated PSA    Foley catheter in place    History of adenomatous polyp of colon    2014  tubular adenoma   History of malignant melanoma    s/p  excision left forearm and left axill lymph node bx 09-10-2004   Hyperlipidemia    Hypertension    Peripheral vascular disease [facility])    Urinary retention    Past Surgical History:  Procedure Laterality Date   [place]   COLONOSCOPY N/A 05/02/2013   Procedure: COLONOSCOPY;  Surgeon: Garlan Fair, MD;  Location: [facility];  Service: Endoscopy;  Laterality: N/A;   INSERTION OF SUPRAPUBIC CATHETER N/A 09/30/2015   Procedure: INSERTION OF SUPRAPUBIC CATHETER;  Surgeon: Kathie Rhodes, MD;  Location: [facility];  Service: Urology;  Laterality: N/A;   SHOULDER ARTHROSCOPY W/ SUBACROMIAL DECOMPRESSION AND DISTAL CLAVICLE EXCISION Left 05-20-2010   w/  Debridement , Bursectomy, Acromioplasty,  Capsulectomy   TRANSURETHRAL RESECTION OF PROSTATE N/A 09/30/2015   Procedure: TURP (TRANSURETHRAL RESECTION OF PROSTATE WITH GYRUS;  Surgeon: Kathie Rhodes, MD;  Location: [place];  Service: Urology;   Laterality: N/A;   WIDE EXCISION MALIGNANT MELANOMA LEFT FOREARM/  LEFT AXILLA LYMPH NODE BX  09-10-2004   Patient Active Problem List   Diagnosis Date Noted   Parkinson's disease ([place]) 04/13/2017   BPH (benign prostatic hypertrophy) with urinary retention 09/30/2015    ONSET DATE: 12/09/21 PD Screen date  REFERRING DIAG: G20 (ICD-10-CM) - Parkinson's disease ([place])   THERAPY DIAG:  Other lack of coordination  Other symptoms and signs involving the nervous system  Abnormal posture  Other symptoms and signs involving the musculoskeletal system  Unsteadiness on feet  Stiffness of right shoulder, not elsewhere classified  Tremor  Stiffness of left shoulder, not elsewhere classified  Rationale for Evaluation and Treatment Rehabilitation  SUBJECTIVE:   SUBJECTIVE STATEMENT: "This is hard"  Pt reports that he had a hard time putting together outdoor furniture with L hand using screwdriver (ended up using electric)  Pt accompanied by: self  PERTINENT HISTORY: arthritis, hx of malignant melanoma, HDL, HTN, Peripheral vascular disease, hx of L shoudler arthroscopy 2011, bilateral cataract surgery 10/2019, hx of limited L wrist flex  PRECAUTIONS: Fall  PAIN:  Are you having pain? 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, PD cycling class, golf 2x/wk (min difficulty with putting golf ball  on tee with tremors), occasional yoga class [facility])  PATIENT GOALS improve coordination, ADLs  OBJECTIVE:    TODAY'S TREATMENT:   Closed-chain shoulder flex in sitting, floor>overhead followed by performing with sit>stand, then in standing in diagonal patterns with lateral wt. Shift, trunk rotations with min cueing for all for large amplitude movements.  Standing, pushing ball up wall with BUEs for high/end range reach with min-mod cueing to avoid trunk hyperext.     Standing, functional step and reach forward/back to deal cards to targets with each UE/LE for incr fine and gross motor coordination and cognitive sequencing with min cueing for large amplitude and to keep feet apart.  Sitting, flipping cards with each hand with functional reach laterally (set-up for large amplitude movements and lateral wt. Shift) with min cueing for large amplitude hand movements, particularly with R hand and to keep feet apart (pt tends to cross them in sitting).  Sitting, stacking coins with each hand simultaneously for bilateral hand coordination/timing with min cueing for [employer] Hands and timing.  Then, manipulating coins in both hands simultaneously to translate palm>fingertips and drop in container 1 at a time with min-mod difficulty for timing/coordination, particularly with L hand.  COORDINATION: 03/23/22:  Box and Blocks:  R-49blocks, L-47blocks   PATIENT EDUCATION: Education details: avoid trunk compensation in sitting/standing Person educated: Patient Education method: Customer service manager Education comprehension: verbalized understanding and returned demonstration   HOME EXERCISE PROGRAM: Not yet issued  ASSESSMENT:  CLINICAL IMPRESSION: Pt demo improved movement amplitude with cueing, but demo difficulty with more complex full body movements and trunk compensation in sitting (crossing feet) and with high reach (trunk hyperext).       PERFORMANCE DEFICITS in functional skills including ADLs, IADLs, coordination, tone, ROM, flexibility, FMC, GMC, mobility, balance, and UE functional use, and psychosocial skills including habits.   IMPAIRMENTS are limiting patient from ADLs, IADLs, and leisure.   COMORBIDITIES may have co-morbidities  that affects occupational performance. Patient will benefit from skilled OT to address above impairments and improve overall function.  MODIFICATION OR ASSISTANCE TO COMPLETE EVALUATION: Min-Moderate modification of  tasks or assist with assess necessary to complete an evaluation.  OT OCCUPATIONAL PROFILE AND HISTORY: Detailed assessment: Review of records and additional review of physical, cognitive, psychosocial history related to current functional performance.  CLINICAL DECISION MAKING: Moderate - several treatment options, min-mod task modification necessary  REHAB POTENTIAL: Good  EVALUATION COMPLEXITY: Moderate   GOALS: Potential Goals reviewed with patient? Yes  SHORT TERM GOALS: Target date: 04/20/2022    Pt will be independent with updated PD-specific HEP.  Goal status: INITIAL  2.  Pt will demo at least 120* R shoulder flexion for overhead reaching. Baseline:  110*  Goal status: INITIAL  3.  Pt will improve L hand coordination for ADLs as shown by improving time on 9-hole peg test by at least 3sec.  Baseline:  36.19sec Goal status: INITIAL  4.  Pt will verbalize understanding of ways to decr risk of future complications related to PD. Goal status: INITIAL    LONG TERM GOALS: Target date: 06/02/2022    Pt will verbalize understanding of adaptive strategies to incr ease, safety, and independence with ADLs/IADLs (including eating, opening containers, sit>stand from various surfaces, picking up items from floor, dressing, putting golf ball on tee, flipping eggs).   Goal status: INITIAL  2.  Pt will demo at least 120* R shoulder flexion for overhead reaching with at least -15* elbow ext.  Baseline:  110*  with -20* elbow ext Goal status: INITIAL  3.  Pt will improve L hand coordination for ADLs as shown by improving time on 9-hole peg test by at least 3sec.  Baseline:  36.19sec Goal status: INITIAL  4.  Pt will improve bilateral hand coordination as shown by fastening/unfastening 3 buttons in 48sec or less. Baseline:   52.56sec Goal status: INITIAL  5.  Pt will improve functional reaching/coordination as shown by improving score on bilateral box and blocks test by 3 with each  UE. Goal status: INITIAL   PLAN: OT FREQUENCY: 2x/week  OT DURATION:  x8 weeks or 16 sessions + eval over 12 weeks (due to scheduling)  PLANNED INTERVENTIONS: self care/ADL training, therapeutic exercise, therapeutic activity, neuromuscular re-education, manual therapy, passive range of motion, balance training, functional mobility training, aquatic therapy, electrical stimulation, ultrasound, fluidotherapy, moist heat, cryotherapy, patient/family education, and DME and/or AE instructions  RECOMMENDED OTHER SERVICES: none at this time  CONSULTED AND AGREED WITH PLAN OF CARE: Patient  PLAN FOR NEXT SESSION:   work on posture (avoid compensation), functional reaching, coordination, L wrist ranger/forearm gym (opening bottles)   Gizel Riedlinger, OTR/L 03/23/2022, 9:40 AM

## 2022-03-26 ENCOUNTER — Encounter: Payer: Self-pay | Admitting: Occupational Therapy

## 2022-03-26 ENCOUNTER — Ambulatory Visit: Payer: No Typology Code available for payment source | Admitting: Occupational Therapy

## 2022-03-26 DIAGNOSIS — R278 Other lack of coordination: Secondary | ICD-10-CM

## 2022-03-26 DIAGNOSIS — R29898 Other symptoms and signs involving the musculoskeletal system: Secondary | ICD-10-CM

## 2022-03-26 DIAGNOSIS — R29818 Other symptoms and signs involving the nervous system: Secondary | ICD-10-CM

## 2022-03-26 DIAGNOSIS — R251 Tremor, unspecified: Secondary | ICD-10-CM

## 2022-03-26 DIAGNOSIS — R2681 Unsteadiness on feet: Secondary | ICD-10-CM

## 2022-03-26 DIAGNOSIS — M25611 Stiffness of right shoulder, not elsewhere classified: Secondary | ICD-10-CM

## 2022-03-26 DIAGNOSIS — R293 Abnormal posture: Secondary | ICD-10-CM

## 2022-03-26 NOTE — Therapy (Signed)
OUTPATIENT OCCUPATIONAL THERAPY PARKINSON'S TREATMENT  Patient Name: Zachary Wang. MRN: [id number] DOB:[birth year], 81 y.o., male Today's Date: 03/26/2022  PCP: Dr. Betsey Amen REFERRING PROVIDER: Dr. Wells Guiles Tat   OT End of Session - 03/26/22 0750     Visit Number 3    Number of Visits 17    Date for OT Re-Evaluation 06/08/22    Authorization Type [place] Auth  6/30-10/28/23    Authorization - Visit Number 3    Authorization - Number of Visits 10    Progress Note Due on Visit 10    OT Start Time 0802    OT Stop Time 0845    OT Time Calculation (min) 43 min    Activity Tolerance Patient tolerated treatment well    Behavior During Therapy [facility] for tasks assessed/performed               Past Medical History:  Diagnosis Date   Arthritis    hands   BPH (benign prostatic hyperplasia)    Elevated PSA    Foley catheter in place    History of adenomatous polyp of colon    2014  tubular adenoma   History of malignant melanoma    s/p  excision left forearm and left axill lymph node bx 09-10-2004   Hyperlipidemia    Hypertension    Peripheral vascular disease [facility])    Urinary retention    Past Surgical History:  Procedure Laterality Date   ANAL FISSURE REPAIR  1984   COLONOSCOPY N/A 05/02/2013   Procedure: COLONOSCOPY;  Surgeon: Garlan Fair, MD;  Location: [facility];  Service: Endoscopy;  Laterality: N/A;   INSERTION OF SUPRAPUBIC CATHETER N/A 09/30/2015   Procedure: INSERTION OF SUPRAPUBIC CATHETER;  Surgeon: Kathie Rhodes, MD;  Location: [facility];  Service: Urology;  Laterality: N/A;   SHOULDER ARTHROSCOPY W/ SUBACROMIAL DECOMPRESSION AND DISTAL CLAVICLE EXCISION Left 05-20-2010   w/  Debridement , Bursectomy, Acromioplasty,  Capsulectomy   TRANSURETHRAL RESECTION OF PROSTATE N/A 09/30/2015   Procedure: TURP (TRANSURETHRAL RESECTION OF PROSTATE WITH GYRUS;  Surgeon: Kathie Rhodes, MD;  Location: [place];  Service: Urology;   Laterality: N/A;   WIDE EXCISION MALIGNANT MELANOMA LEFT FOREARM/  LEFT AXILLA LYMPH NODE BX  09-10-2004   Patient Active Problem List   Diagnosis Date Noted   Parkinson's disease ([place]) 04/13/2017   BPH (benign prostatic hypertrophy) with urinary retention 09/30/2015    ONSET DATE: 12/09/21 PD Screen date  REFERRING DIAG: G20 (ICD-10-CM) - Parkinson's disease ([place])   THERAPY DIAG:  Other symptoms and signs involving the nervous system  Other symptoms and signs involving the musculoskeletal system  Abnormal posture  Other lack of coordination  Unsteadiness on feet  Stiffness of right shoulder, not elsewhere classified  Tremor  Rationale for Evaluation and Treatment Rehabilitation  SUBJECTIVE:   SUBJECTIVE STATEMENT: "Do you think stretching would've helped me put the furniture together?"   Pt accompanied by: self  PERTINENT HISTORY: arthritis, hx of malignant melanoma, HDL, HTN, Peripheral vascular disease, hx of L shoudler arthroscopy 2011, bilateral cataract surgery 10/2019, hx of limited L wrist flex  PRECAUTIONS: Fall  PAIN:  Are you having pain? 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 Moves Class, PD cycling class, golf 2x/wk (min difficulty with putting golf ball on tee with tremors), occasional yoga class [facility])  PATIENT GOALS improve coordination, ADLs  OBJECTIVE:  TODAY'S TREATMENT:   Closed-chain shoulder flex in sitting, floor>overhead followed by performing with sit>stand, then in standing in diagonal patterns with lateral wt. Shift, trunk rotations with min cueing for all for large amplitude movements and trunk alignment (avoid hyperext).  Standing, pushing ball up wall with BUEs for high/end range reach with min-mod cueing to avoid trunk hyperext.    Standing, PWR! Up with red theraband (attached above and at midlevel) x10 each with mod  cueing for trunk alignment, forearm/shoulder positioning, feet apart, and head up for improved posture.    Standing, functional step and reach diagonal backwards forward/back over approx 3" obstacle with each UE/LE with flipping cards for incr fine and gross motor coordination with min cueing for large amplitudes.  Pt demo incr difficulty stepping with LLE.   Standing with cueing for feet apart (1 foot in front/staggered), while reaching down to put large pegs in pegboard with RUE with cueing for PWR! Hands prior to grasp, then reaching to place ball on peg, and then to place peg/ball at the same time to simulate putting golf ball on tee with overall min-mod cueing for use of adaptive strategies.  Pt demo incr ease/less difficulty with use of strategies.  Practiced twisting bottle cap, jar lid, and nut/bolt with each hand with mod cueing (verbal and tactile), particularly with LUE for use of strategies and to incr forced use of LUE.  Pt demo incr ease with use of strategies.    PATIENT EDUCATION: Education details:  avoid trunk compensation in sitting/standing; strategies for decr tremors with activity (using screwdriver/opening bottles, and placing golf tee and ball)--recommended pt perform deliberate movements/avoid hesitation, stop-stretch-restart big if difficulty, and can squeeze ball and open big/deliberate if difficulty/tremors. Also instructed pt to keep feet further apart with 1 in front while reaching to ground.   Person educated: Patient Education method: Explanation, Demonstration, Tactile cues, and Verbal cues Education comprehension: verbalized understanding, returned demonstration, verbal cues required, tactile cues required, and needs further education   HOME EXERCISE PROGRAM: Not yet issued  GOALS: Potential Goals reviewed with patient? Yes  SHORT TERM GOALS: Target date: 04/23/2022    Pt will be independent with updated PD-specific HEP.  Goal status: INITIAL  2.  Pt will  demo at least 120* R shoulder flexion for overhead reaching. Baseline:  110*  Goal status: INITIAL  3.  Pt will improve L hand coordination for ADLs as shown by improving time on 9-hole peg test by at least 3sec.  Baseline:  36.19sec Goal status: INITIAL  4.  Pt will verbalize understanding of ways to decr risk of future complications related to PD. Goal status: INITIAL    LONG TERM GOALS: Target date: 06/02/2022    Pt will verbalize understanding of adaptive strategies to incr ease, safety, and independence with ADLs/IADLs (including eating, opening containers, sit>stand from various surfaces, picking up items from floor, dressing, putting golf ball on tee, flipping eggs).   Goal status: INITIAL  2.  Pt will demo at least 120* R shoulder flexion for overhead reaching with at least -15* elbow ext.  Baseline:  110* with -20* elbow ext Goal status: INITIAL  3.  Pt will improve L hand coordination for ADLs as shown by improving time on 9-hole peg test by at least 3sec.  Baseline:  36.19sec Goal status: INITIAL  4.  Pt will improve bilateral hand coordination as shown by fastening/unfastening 3 buttons in 48sec or less. Baseline:   52.56sec Goal status: INITIAL  5.  Pt  will improve functional reaching/coordination as shown by improving score on bilateral box and blocks test by 3 with each UE. Goal status: INITIAL   ASSESSMENT:  CLINICAL IMPRESSION: Pt demo improved movement amplitude during functional tasks with cueing/use of strategies; however, pt will need repetition and cueing to incr carryover/calibration.  PERFORMANCE DEFICITS in functional skills including ADLs, IADLs, coordination, tone, ROM, flexibility, FMC, GMC, mobility, balance, and UE functional use, and psychosocial skills including habits.   IMPAIRMENTS are limiting patient from ADLs, IADLs, and leisure.   COMORBIDITIES may have co-morbidities  that affects occupational performance. Patient will benefit from  skilled OT to address above impairments and improve overall function.  MODIFICATION OR ASSISTANCE TO COMPLETE EVALUATION: Min-Moderate modification of tasks or assist with assess necessary to complete an evaluation.  OT OCCUPATIONAL PROFILE AND HISTORY: Detailed assessment: Review of records and additional review of physical, cognitive, psychosocial history related to current functional performance.  CLINICAL DECISION MAKING: Moderate - several treatment options, min-mod task modification necessary  REHAB POTENTIAL: Good  EVALUATION COMPLEXITY: Moderate    PLAN: OT FREQUENCY: 2x/week  OT DURATION:  x8 weeks or 16 sessions + eval over 12 weeks (due to scheduling)  PLANNED INTERVENTIONS: self care/ADL training, therapeutic exercise, therapeutic activity, neuromuscular re-education, manual therapy, passive range of motion, balance training, functional mobility training, aquatic therapy, electrical stimulation, ultrasound, fluidotherapy, moist heat, cryotherapy, patient/family education, and DME and/or AE instructions  RECOMMENDED OTHER SERVICES: none at this time  CONSULTED AND AGREED WITH PLAN OF CARE: Patient  PLAN FOR NEXT SESSION:    work on posture (avoid compensation), functional reaching, coordination, ADL strategies   Harryette Shuart, OTR/L 03/26/2022, 9:08 AM

## 2022-03-30 NOTE — Therapy (Signed)
OUTPATIENT OCCUPATIONAL THERAPY PARKINSON'S TREATMENT  Patient Name: Zachary Wang. MRN: [id number] DOB:May 19, 1941, 81 y.o., male Today's Date: 03/31/2022  PCP: Dr. Betsey Amen REFERRING PROVIDER: Dr. Wells Guiles Tat   OT End of Session - 03/31/22 0759     Visit Number 4    Number of Visits 17    Date for OT Re-Evaluation 06/08/22    Authorization Type [place] Auth  6/30-10/28/23    Authorization - Visit Number 4    Authorization - Number of Visits 10    Progress Note Due on Visit 10    OT Start Time 0804    OT Stop Time 0845    OT Time Calculation (min) 41 min    Activity Tolerance Patient tolerated treatment well    Behavior During Therapy [facility] for tasks assessed/performed                Past Medical History:  Diagnosis Date   Arthritis    hands   BPH (benign prostatic hyperplasia)    Elevated PSA    Foley catheter in place    History of adenomatous polyp of colon    2014  tubular adenoma   History of malignant melanoma    s/p  excision left forearm and left axill lymph node bx 09-10-2004   Hyperlipidemia    Hypertension    Peripheral vascular disease [facility])    Urinary retention    Past Surgical History:  Procedure Laterality Date   [place]   COLONOSCOPY N/A 05/02/2013   Procedure: COLONOSCOPY;  Surgeon: Garlan Fair, MD;  [facility];  Service: Endoscopy;  Laterality: N/A;   INSERTION OF SUPRAPUBIC CATHETER N/A 09/30/2015   Procedure: INSERTION OF SUPRAPUBIC CATHETER;  Surgeon: Kathie Rhodes, MD;  Location: [facility];  Service: Urology;  Laterality: N/A;   SHOULDER ARTHROSCOPY W/ SUBACROMIAL DECOMPRESSION AND DISTAL CLAVICLE EXCISION Left 05-20-2010   w/  Debridement , Bursectomy, Acromioplasty,  Capsulectomy   TRANSURETHRAL RESECTION OF PROSTATE N/A 09/30/2015   Procedure: TURP (TRANSURETHRAL RESECTION OF PROSTATE WITH GYRUS;  Surgeon: Kathie Rhodes, MD;  Location: [place];  Service: Urology;   Laterality: N/A;   WIDE EXCISION MALIGNANT MELANOMA LEFT FOREARM/  LEFT AXILLA LYMPH NODE BX  09-10-2004   Patient Active Problem List   Diagnosis Date Noted   Parkinson's disease ([place]) 04/13/2017   BPH (benign prostatic hypertrophy) with urinary retention 09/30/2015    ONSET DATE: 12/09/21 PD Screen date  REFERRING DIAG: G20 (ICD-10-CM) - Parkinson's disease ([place])   THERAPY DIAG:  Other symptoms and signs involving the nervous system  Other symptoms and signs involving the musculoskeletal system  Abnormal posture  Other lack of coordination  Unsteadiness on feet  Stiffness of right shoulder, not elsewhere classified  Tremor  Stiffness of left shoulder, not elsewhere classified  Rationale for Evaluation and Treatment Rehabilitation  SUBJECTIVE:   SUBJECTIVE STATEMENT: "Opening my hand up did help with putting the golf ball on the tee"   Pt accompanied by: self  PERTINENT HISTORY: arthritis, hx of malignant melanoma, HDL, HTN, Peripheral vascular disease, hx of L shoudler arthroscopy 2011, bilateral cataract surgery 10/2019, hx of limited L wrist flex  PRECAUTIONS: Fall  PAIN:  Are you having pain? 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 Moves Wang, PD cycling Wang, golf 2x/wk (min difficulty with putting golf ball on tee with tremors), occasional yoga Wang ([place])  PATIENT GOALS improve coordination, ADLs  OBJECTIVE:    TODAY'S TREATMENT:   PWR! Moves (up, rock, twist) in modified quadruped x 10 each with min cues For incr movement amplitude and trunk alignment.   PATIENT EDUCATION: Education details:  PWR! Hands (review & re-issue); Other Hand HEP for ROM/coordination (see pt instructions); review and updated coordination HEP (see pt instructions)   Person educated: Patient Education method: Explanation, Demonstration, Tactile cues, Verbal  cues, and Handouts Education comprehension: verbalized understanding, returned demonstration, and verbal cues required for incr movement amplitude/technique   HOME EXERCISE PROGRAM: 03/31/22:  PWR! Hands (review & re-issue); Other Hand HEP for ROM/coordination (finger walking for abduction, individual finger lifts, isolated IP flex); review and updated coordination HEP (see pt instructions--added to perform with BUEs simultaneously)    GOALS: Potential Goals reviewed with patient? Yes  SHORT TERM GOALS: Target date: 04/23/2022    Pt will be independent with updated PD-specific HEP.  Goal status: INITIAL  2.  Pt will demo at least 120* R shoulder flexion for overhead reaching. Baseline:  110*  Goal status: INITIAL  3.  Pt will improve L hand coordination for ADLs as shown by improving time on 9-hole peg test by at least 3sec.  Baseline:  36.19sec Goal status: INITIAL  4.  Pt will verbalize understanding of ways to decr risk of future complications related to PD. Goal status: INITIAL    LONG TERM GOALS: Target date: 06/02/2022    Pt will verbalize understanding of adaptive strategies to incr ease, safety, and independence with ADLs/IADLs (including eating, opening containers, sit>stand from various surfaces, picking up items from floor, dressing, putting golf ball on tee, flipping eggs).   Goal status: INITIAL  2.  Pt will demo at least 120* R shoulder flexion for overhead reaching with at least -15* elbow ext.  Baseline:  110* with -20* elbow ext Goal status: INITIAL  3.  Pt will improve L hand coordination for ADLs as shown by improving time on 9-hole peg test by at least 3sec.  Baseline:  36.19sec Goal status: INITIAL  4.  Pt will improve bilateral hand coordination as shown by fastening/unfastening 3 buttons in 48sec or less. Baseline:   52.56sec Goal status: INITIAL  5.  Pt will improve functional reaching/coordination as shown by improving score on bilateral box and  blocks test by 3 with each UE. Goal status: INITIAL   ASSESSMENT:  CLINICAL IMPRESSION: Pt reports incr ease with putting golf ball on tee with use of strategies and demo improving coordination.  PERFORMANCE DEFICITS in functional skills including ADLs, IADLs, coordination, tone, ROM, flexibility, FMC, GMC, mobility, balance, and UE functional use, and psychosocial skills including habits.   IMPAIRMENTS are limiting patient from ADLs, IADLs, and leisure.   COMORBIDITIES may have co-morbidities  that affects occupational performance. Patient will benefit from skilled OT to address above impairments and improve overall function.  MODIFICATION OR ASSISTANCE TO COMPLETE EVALUATION: Min-Moderate modification of tasks or assist with assess necessary to complete an evaluation.  OT OCCUPATIONAL PROFILE AND HISTORY: Detailed assessment: Review of records and additional review of physical, cognitive, psychosocial history related to current functional performance.  CLINICAL DECISION MAKING: Moderate - several treatment options, min-mod task modification necessary  REHAB POTENTIAL: Good  EVALUATION COMPLEXITY: Moderate    PLAN: OT FREQUENCY: 2x/week  OT DURATION:  x8 weeks or 16 sessions + eval over 12 weeks (due to scheduling)  PLANNED INTERVENTIONS: self care/ADL training, therapeutic exercise, therapeutic activity, neuromuscular re-education, manual therapy,  passive range of motion, balance training, functional mobility training, aquatic therapy, electrical stimulation, ultrasound, fluidotherapy, moist heat, cryotherapy, patient/family education, and DME and/or AE instructions  RECOMMENDED OTHER SERVICES: none at this time  CONSULTED AND AGREED WITH PLAN OF CARE: Patient  PLAN FOR NEXT SESSION:    work on posture (avoid compensation), functional reaching, coordination, ADL strategies   Tamla Winkels, OTR/L 03/31/2022, 11:05 AM

## 2022-03-31 ENCOUNTER — Encounter: Payer: Self-pay | Admitting: Occupational Therapy

## 2022-03-31 ENCOUNTER — Ambulatory Visit: Payer: No Typology Code available for payment source | Admitting: Occupational Therapy

## 2022-03-31 DIAGNOSIS — R251 Tremor, unspecified: Secondary | ICD-10-CM

## 2022-03-31 DIAGNOSIS — R278 Other lack of coordination: Secondary | ICD-10-CM | POA: Diagnosis not present

## 2022-03-31 DIAGNOSIS — R29898 Other symptoms and signs involving the musculoskeletal system: Secondary | ICD-10-CM

## 2022-03-31 DIAGNOSIS — R29818 Other symptoms and signs involving the nervous system: Secondary | ICD-10-CM

## 2022-03-31 DIAGNOSIS — M25611 Stiffness of right shoulder, not elsewhere classified: Secondary | ICD-10-CM

## 2022-03-31 DIAGNOSIS — R293 Abnormal posture: Secondary | ICD-10-CM

## 2022-03-31 DIAGNOSIS — R2681 Unsteadiness on feet: Secondary | ICD-10-CM

## 2022-03-31 DIAGNOSIS — M25612 Stiffness of left shoulder, not elsewhere classified: Secondary | ICD-10-CM

## 2022-03-31 NOTE — Patient Instructions (Addendum)
PWR! Hand Exercises  Then, start with elbows bent and hands closed:  PWR! Hands: Push hands out BIG. Elbows straight, wrists up, fingers open and spread apart BIG.   PWR! Step: Touch index finger to thumb while keeping other fingers straight. Flick fingers out BIG (thumb out/straighten fingers). Repeat with other fingers. (Step your thumb to each finger).  With arms stretched out in front of you (elbows straight), perform the following:  PWR! Rock:  Move wrists up and down [employer]! Twist: Twist palms up and down BIG  ** Make each movement big and deliberate so that you feel the movement.  Perform at least 10 repetitions 1x/day, but perform PWR! Hands throughout the day when you are having trouble using your hands (picking up/manipulating small objects, writing, eating, typing, sewing, buttoning, etc.).       Flexor Tendon Gliding (Active Hook Fist)   With fingers and knuckles straight, bend middle and tip joints. Do not bend large knuckles. Repeat 5-10 times. Do 1x/day  Finger Walk (Active to Counteract Ulnar Deviation    With palm flat on table and held steady, lift or slide fingers one by one toward thumb. Then walk fingers back one at a time the other way.  Push through palm.  5-10x, 1x/day   MP Extension (Active)    With palm on table, straighten fingers completely at large knuckles one at a time, and lift fingers off table. 5-10x, 1x/day           Coordination Exercises   Perform the following exercises for 20 minutes 1 times per day. Perform with both hand(s). Perform using big movements.  **Try to perform with both hands at the same time for extra challenge**     Flipping Cards: Place deck of cards on the table. Flip cards over by opening your hand big to grasp and then turn your palm up big, opening hand fully to release.** Deal cards: Hold 1/2 or whole deck in your hand. Use thumb to push card off top of deck with one big push.** Flip card between  each finger and back Place card on tabletop.  Then flick fingers (extend fingers) powerfully to slide card off table (can have chair/box below table to catch the cards).** Rotate ball with fingertips: Pick up with fingers/thumb and move as much as you can with each turn/movement (clockwise and counter-clockwise).** Toss ball from one hand to the other: Toss big/high.  Deliberately open with toss and deliberately close hand/squeeze ball after catch.** Toss ball in the air and catch with the same hand: Toss big/high.  Deliberately open with toss and deliberately close hand/squeeze ball after catch. Rotate 2 golf balls in your hand: Both directions if you can. Pick up coins and stack one at a time: Open hand big and pick up with big, intentional movements. Do not drag coin to the edge. (5-10 in a stack)** Pick up 5-10 coins one at a time and hold in palm. Then, move coins from palm to fingertips one at time and place in coin bank/container.** Practice writing: Slow down, write big, and focus on forming each letter.  Stop and stretch hand frequently/if writing gets smaller.   Hold end of small trash/produce bag in fingers and pull bag into palm by extending fingers big**

## 2022-04-02 ENCOUNTER — Encounter: Payer: Self-pay | Admitting: Occupational Therapy

## 2022-04-02 ENCOUNTER — Ambulatory Visit: Payer: No Typology Code available for payment source | Admitting: Occupational Therapy

## 2022-04-02 DIAGNOSIS — R278 Other lack of coordination: Secondary | ICD-10-CM | POA: Diagnosis not present

## 2022-04-02 DIAGNOSIS — R29898 Other symptoms and signs involving the musculoskeletal system: Secondary | ICD-10-CM

## 2022-04-02 DIAGNOSIS — R293 Abnormal posture: Secondary | ICD-10-CM

## 2022-04-02 DIAGNOSIS — M25612 Stiffness of left shoulder, not elsewhere classified: Secondary | ICD-10-CM

## 2022-04-02 DIAGNOSIS — M25611 Stiffness of right shoulder, not elsewhere classified: Secondary | ICD-10-CM

## 2022-04-02 DIAGNOSIS — R2681 Unsteadiness on feet: Secondary | ICD-10-CM

## 2022-04-02 DIAGNOSIS — R29818 Other symptoms and signs involving the nervous system: Secondary | ICD-10-CM

## 2022-04-02 DIAGNOSIS — R251 Tremor, unspecified: Secondary | ICD-10-CM

## 2022-04-02 NOTE — Therapy (Signed)
OUTPATIENT OCCUPATIONAL THERAPY PARKINSON'S TREATMENT  Patient Name: Zachary Wang. MRN: [id number] DOB:Oct 15, 1940, 81 y.o., male Today's Date: 04/02/2022  PCP: Dr. Betsey Amen REFERRING PROVIDER: Dr. Wells Guiles Tat   OT End of Session - 04/02/22 0804     Visit Number 5    Number of Visits 17    Date for OT Re-Evaluation 06/08/22    Authorization Type [place] Auth  6/30-10/28/23    Authorization - Visit Number 5    Authorization - Number of Visits 10    Progress Note Due on Visit 10    OT Start Time 0804    OT Stop Time 0845    OT Time Calculation (min) 41 min    Activity Tolerance Patient tolerated treatment well    Behavior During Therapy [facility] for tasks assessed/performed                 Past Medical History:  Diagnosis Date   Arthritis    hands   BPH (benign prostatic hyperplasia)    Elevated PSA    Foley catheter in place    History of adenomatous polyp of colon    2014  tubular adenoma   History of malignant melanoma    s/p  excision left forearm and left axill lymph node bx 09-10-2004   Hyperlipidemia    Hypertension    Peripheral vascular disease [facility])    Urinary retention    Past Surgical History:  Procedure Laterality Date   ANAL FISSURE REPAIR  1984   COLONOSCOPY N/A 05/02/2013   Procedure: COLONOSCOPY;  Surgeon: Garlan Fair, MD;  Location: [facility];  Service: Endoscopy;  Laterality: N/A;   INSERTION OF SUPRAPUBIC CATHETER N/A 09/30/2015   Procedure: INSERTION OF SUPRAPUBIC CATHETER;  Surgeon: Kathie Rhodes, MD;  Location: [facility];  Service: Urology;  Laterality: N/A;   SHOULDER ARTHROSCOPY W/ SUBACROMIAL DECOMPRESSION AND DISTAL CLAVICLE EXCISION Left 05-20-2010   w/  Debridement , Bursectomy, Acromioplasty,  Capsulectomy   TRANSURETHRAL RESECTION OF PROSTATE N/A 09/30/2015   Procedure: TURP (TRANSURETHRAL RESECTION OF PROSTATE WITH GYRUS;  Surgeon: Kathie Rhodes, MD;  Location: [place];  Service: Urology;   Laterality: N/A;   WIDE EXCISION MALIGNANT MELANOMA LEFT FOREARM/  LEFT AXILLA LYMPH NODE BX  09-10-2004   Patient Active Problem List   Diagnosis Date Noted   Parkinson's disease ([place]) 04/13/2017   BPH (benign prostatic hypertrophy) with urinary retention 09/30/2015    ONSET DATE: 12/09/21 PD Screen date  REFERRING DIAG: G20 (ICD-10-CM) - Parkinson's disease ([place])   THERAPY DIAG:  Other symptoms and signs involving the nervous system  Other symptoms and signs involving the musculoskeletal system  Abnormal posture  Other lack of coordination  Stiffness of right shoulder, not elsewhere classified  Tremor  Stiffness of left shoulder, not elsewhere classified  Unsteadiness on feet  Rationale for Evaluation and Treatment Rehabilitation  SUBJECTIVE:   SUBJECTIVE STATEMENT: Feels a little nausea this morning   Pt accompanied by: self  PERTINENT HISTORY: arthritis, hx of malignant melanoma, HDL, HTN, Peripheral vascular disease, hx of L shoudler arthroscopy 2011, bilateral cataract surgery 10/2019, hx of limited L wrist flex  PRECAUTIONS: Fall  PAIN:  Are you having pain? 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, PD cycling class, golf 2x/wk (min difficulty with putting golf ball on tee with tremors), occasional yoga class [facility])  PATIENT GOALS improve coordination, ADLs  OBJECTIVE:    TODAY'S TREATMENT:    With both hands:  with palm on table, individual finger lifts with abduction/adduction, then isolated/individual finger lifts for incr MP ext. With min v.c. for incr movement amplitude.  Isolated IP flexion/ext for decr rigidity of hands with min v.c.    Forearm gym with each UE for incr wrist ROM with large amplitude, mid difficulty with RUE, mod difficulty/cueing for compensation with LUE.   Practiced simulated flipping egg with pan,  spatula, and card.  Pt instructed to hold spatula in middle of handle vs. End and hold with thumb up.  Pt demo improved with repetition/cueing.   Practiced fastening/unfastening buttons with min cueing for review/use of large amplitude movement strategies.  Performed x2 with noted improvement 2x.   In sitting, functional reaching to Place small pegs in vertical pegboard to copy design for incr coordination and cognitive component with min cueing for large amplitude movements and alternating hands (set up for large amplitude overhead reach).     PATIENT EDUCATION: Education details:  Reviewed finger walking for abduction, individual finger lifts, isolated IP flex Person educated: Patient Education method: Explanation, Demonstration, and Verbal cues Education comprehension: verbalized understanding, returned demonstration, and verbal cues required for incr movement amplitude/technique   HOME EXERCISE PROGRAM: 03/31/22:  [employer] Hands (review & re-issue); Other Hand HEP for ROM/coordination (finger walking for abduction, individual finger lifts, isolated IP flex); review and updated coordination HEP (see pt instructions--added to perform with BUEs simultaneously)    GOALS: Potential Goals reviewed with patient? Yes  SHORT TERM GOALS: Target date: 04/23/2022    Pt will be independent with updated PD-specific HEP.  Goal status: INITIAL  2.  Pt will demo at least 120* R shoulder flexion for overhead reaching. Baseline:  110*  Goal status: INITIAL  3.  Pt will improve L hand coordination for ADLs as shown by improving time on 9-hole peg test by at least 3sec.  Baseline:  36.19sec Goal status: INITIAL  4.  Pt will verbalize understanding of ways to decr risk of future complications related to PD. Goal status: INITIAL    LONG TERM GOALS: Target date: 06/02/2022    Pt will verbalize understanding of adaptive strategies to incr ease, safety, and independence with ADLs/IADLs (including  eating, opening containers, sit>stand from various surfaces, picking up items from floor, dressing, putting golf ball on tee, flipping eggs).   Goal status: INITIAL  2.  Pt will demo at least 120* R shoulder flexion for overhead reaching with at least -15* elbow ext.  Baseline:  110* with -20* elbow ext Goal status: INITIAL  3.  Pt will improve L hand coordination for ADLs as shown by improving time on 9-hole peg test by at least 3sec.  Baseline:  36.19sec Goal status: INITIAL  4.  Pt will improve bilateral hand coordination as shown by fastening/unfastening 3 buttons in 48sec or less. Baseline:   52.56sec Goal status: INITIAL  5.  Pt will improve functional reaching/coordination as shown by improving score on bilateral box and blocks test by 3 with each UE. Goal status: INITIAL   ASSESSMENT:  CLINICAL IMPRESSION: Pt is progressing towards goals with improving large amplitude movements with functional tasks with min cueing and repetition.   PERFORMANCE DEFICITS in functional skills including ADLs, IADLs, coordination, tone, ROM, flexibility, FMC, GMC, mobility, balance, and UE functional use, and psychosocial skills including habits.   IMPAIRMENTS are limiting patient from ADLs, IADLs, and leisure.   COMORBIDITIES may have co-morbidities  that  affects occupational performance. Patient will benefit from skilled OT to address above impairments and improve overall function.  MODIFICATION OR ASSISTANCE TO COMPLETE EVALUATION: Min-Moderate modification of tasks or assist with assess necessary to complete an evaluation.  OT OCCUPATIONAL PROFILE AND HISTORY: Detailed assessment: Review of records and additional review of physical, cognitive, psychosocial history related to current functional performance.  CLINICAL DECISION MAKING: Moderate - several treatment options, min-mod task modification necessary  REHAB POTENTIAL: Good  EVALUATION COMPLEXITY: Moderate    PLAN: OT FREQUENCY:  2x/week  OT DURATION:  x8 weeks or 16 sessions + eval over 12 weeks (due to scheduling)  PLANNED INTERVENTIONS: self care/ADL training, therapeutic exercise, therapeutic activity, neuromuscular re-education, manual therapy, passive range of motion, balance training, functional mobility training, aquatic therapy, electrical stimulation, ultrasound, fluidotherapy, moist heat, cryotherapy, patient/family education, and DME and/or AE instructions  RECOMMENDED OTHER SERVICES: none at this time  CONSULTED AND AGREED WITH PLAN OF CARE: Patient  PLAN FOR NEXT SESSION:    work on posture (avoid compensation), functional reaching, coordination, ADL strategies, begin checking STGs next week.   Sanari Offner, OTR/L 04/02/2022, 8:44 AM

## 2022-04-05 NOTE — Therapy (Signed)
OUTPATIENT OCCUPATIONAL THERAPY PARKINSON'S TREATMENT  Patient Name: Zachary Wang. MRN: [id number] DOB:1941-06-12, 81 y.o., male Today's Date: 04/06/2022  PCP: Dr. Betsey Amen REFERRING PROVIDER: Dr. Wells Guiles Tat   OT End of Session - 04/06/22 0809     Visit Number 6    Number of Visits 17    Date for OT Re-Evaluation 06/08/22    Authorization Type [facility] Auth  6/30-10/28/23    Authorization - Visit Number 6    Authorization - Number of Visits 10    Progress Note Due on Visit 10    OT Start Time 0807    OT Stop Time 0849    OT Time Calculation (min) 42 min    Activity Tolerance Patient tolerated treatment well    Behavior During Therapy [facility] for tasks assessed/performed                  Past Medical History:  Diagnosis Date   Arthritis    hands   BPH (benign prostatic hyperplasia)    Elevated PSA    Foley catheter in place    History of adenomatous polyp of colon    2014  tubular adenoma   History of malignant melanoma    s/p  excision left forearm and left axill lymph node bx 09-10-2004   Hyperlipidemia    Hypertension    Peripheral vascular disease [facility])    Urinary retention    Past Surgical History:  Procedure Laterality Date   [place]   COLONOSCOPY N/A 05/02/2013   Procedure: COLONOSCOPY;  Surgeon: Garlan Fair, MD;  Location: [facility];  Service: Endoscopy;  Laterality: N/A;   INSERTION OF SUPRAPUBIC CATHETER N/A 09/30/2015   Procedure: INSERTION OF SUPRAPUBIC CATHETER;  Surgeon: Kathie Rhodes, MD;  Location: [facility];  Service: Urology;  Laterality: N/A;   SHOULDER ARTHROSCOPY W/ SUBACROMIAL DECOMPRESSION AND DISTAL CLAVICLE EXCISION Left 05-20-2010   w/  Debridement , Bursectomy, Acromioplasty,  Capsulectomy   TRANSURETHRAL RESECTION OF PROSTATE N/A 09/30/2015   Procedure: TURP (TRANSURETHRAL RESECTION OF PROSTATE WITH GYRUS;  Surgeon: Kathie Rhodes, MD;  Location: [place];  Service:  Urology;  Laterality: N/A;   WIDE EXCISION MALIGNANT MELANOMA LEFT FOREARM/  LEFT AXILLA LYMPH NODE BX  09-10-2004   Patient Active Problem List   Diagnosis Date Noted   Parkinson's disease ([place]) 04/13/2017   BPH (benign prostatic hypertrophy) with urinary retention 09/30/2015    ONSET DATE: 12/09/21 PD Screen date  REFERRING DIAG: G20 (ICD-10-CM) - Parkinson's disease ([place])   THERAPY DIAG:  Other symptoms and signs involving the nervous system  Other symptoms and signs involving the musculoskeletal system  Abnormal posture  Other lack of coordination  Stiffness of right shoulder, not elsewhere classified  Tremor  Stiffness of left shoulder, not elsewhere classified  Unsteadiness on feet  Rationale for Evaluation and Treatment Rehabilitation  SUBJECTIVE:   SUBJECTIVE STATEMENT: Pt reports improvement putting golf ball on tee.   Pt accompanied by: self  PERTINENT HISTORY: arthritis, hx of malignant melanoma, HDL, HTN, Peripheral vascular disease, hx of L shoudler arthroscopy 2011, bilateral cataract surgery 10/2019, hx of limited L wrist flex  PRECAUTIONS: Fall  PAIN:  Are you having pain? 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, PD cycling class, golf 2x/wk (min difficulty with putting golf ball on tee with tremors), occasional yoga class [facility])  PATIENT GOALS  improve coordination, ADLs   OBJECTIVE:   TODAY'S TREATMENT:    With both hands:  with palm on table, individual finger lifts with abduction/adduction, then isolated/individual finger lifts for incr MP ext. With min v.c. for incr movement amplitude.  Isolated IP flexion/ext for decr rigidity of hands with min v.c.    Forearm gym with each UE for incr wrist ROM with large amplitude, min difficulty/cueing for compensation.   Pulling bag into palm of each hand with min cueing for  large amplitude movements--improved today (to simulate clothing adjustment/donning socks).    Standing, [employer] to reach and touch targets with focus/set-up for large amplitude movements.    Closed-chain shoulder flex in sitting, floor>overhead followed by performing with sit>stand, then in standing in diagonal patterns with lateral wt. Shift, trunk rotations with min cueing for all for large amplitude movements.  Checked STGs and discussed progress.--see below      PATIENT EDUCATION: Education details:  Ways to decr risk of future complications related to PD  Person educated: Patient Education method: Explanation, Demonstration, and Verbal cues Education comprehension: verbalized understanding   HOME EXERCISE PROGRAM: 03/31/22:  [employer] Hands (review & re-issue); Other Hand HEP for ROM/coordination (finger walking for abduction, individual finger lifts, isolated IP flex); review and updated coordination HEP (see pt instructions--added to perform with BUEs simultaneously)   04/06/22:  Ways to decr risk of future complications related to PD  GOALS: Potential Goals reviewed with patient? Yes  SHORT TERM GOALS: Target date: 04/23/2022    Pt will be independent with updated PD-specific HEP.  Goal status: ONGOING  04/06/22  2.  Pt will demo at least 120* R shoulder flexion for overhead reaching. Baseline:  110*  Goal status: MET.  04/06/22:  130*, -5* elbow ext.   3.  Pt will improve L hand coordination for ADLs as shown by improving time on 9-hole peg test by at least 3sec.  Baseline:  36.19sec Goal status: ONGOING.  04/06/22:  45.40sec, 44.82sec.  4.  Pt will verbalize understanding of ways to decr risk of future complications related to PD. Goal status: MET.  04/06/22    LONG TERM GOALS: Target date: 06/02/2022    Pt will verbalize understanding of adaptive strategies to incr ease, safety, and independence with ADLs/IADLs (including eating, opening containers, sit>stand from various  surfaces, picking up items from floor, dressing, putting golf ball on tee, flipping eggs).   Goal status: INITIAL  2.  Pt will demo at least 120* R shoulder flexion for overhead reaching with at least -15* elbow ext.  Baseline:  110* with -20* elbow ext Goal status: MET.  04/06/22:  130* with -5* elbow ext.  (LUE 130* with elbow ext WNL)  3.  Pt will improve L hand coordination for ADLs as shown by improving time on 9-hole peg test by at least 3sec.  Baseline:  36.19sec Goal status: INITIAL  4.  Pt will improve bilateral hand coordination as shown by fastening/unfastening 3 buttons in 48sec or less. Baseline:   52.56sec Goal status: INITIAL  5.  Pt will improve functional reaching/coordination as shown by improving score on bilateral box and blocks test by 3 with each UE. Goal status: INITIAL   ASSESSMENT:  CLINICAL IMPRESSION: Pt is progressing towards goals with improving large amplitude movements with functional tasks and isolated finger movements with L hand with min cueing and repetition.  Pt also demo incr bilateral shoulder ROM, but continues to demo difficulty/compensation with in-hand manipulation.   PERFORMANCE DEFICITS  in functional skills including ADLs, IADLs, coordination, tone, ROM, flexibility, FMC, GMC, mobility, balance, and UE functional use, and psychosocial skills including habits.   IMPAIRMENTS are limiting patient from ADLs, IADLs, and leisure.   COMORBIDITIES may have co-morbidities  that affects occupational performance. Patient will benefit from skilled OT to address above impairments and improve overall function.  MODIFICATION OR ASSISTANCE TO COMPLETE EVALUATION: Min-Moderate modification of tasks or assist with assess necessary to complete an evaluation.  OT OCCUPATIONAL PROFILE AND HISTORY: Detailed assessment: Review of records and additional review of physical, cognitive, psychosocial history related to current functional performance.  CLINICAL  DECISION MAKING: Moderate - several treatment options, min-mod task modification necessary  REHAB POTENTIAL: Good  EVALUATION COMPLEXITY: Moderate    PLAN: OT FREQUENCY: 2x/week  OT DURATION:  x8 weeks or 16 sessions + eval over 12 weeks (due to scheduling)  PLANNED INTERVENTIONS: self care/ADL training, therapeutic exercise, therapeutic activity, neuromuscular re-education, manual therapy, passive range of motion, balance training, functional mobility training, aquatic therapy, electrical stimulation, ultrasound, fluidotherapy, moist heat, cryotherapy, patient/family education, and DME and/or AE instructions  RECOMMENDED OTHER SERVICES: none at this time  CONSULTED AND AGREED WITH PLAN OF CARE: Patient  PLAN FOR NEXT SESSION:    work on posture (avoid compensation), in-hand manipulation without compensation/coordination, ADL strategies   FREEMAN,ANGELA, OTR/L 04/06/2022, 9:27 AM

## 2022-04-06 ENCOUNTER — Ambulatory Visit: Payer: No Typology Code available for payment source | Admitting: Occupational Therapy

## 2022-04-06 ENCOUNTER — Encounter: Payer: Self-pay | Admitting: Occupational Therapy

## 2022-04-06 DIAGNOSIS — R251 Tremor, unspecified: Secondary | ICD-10-CM

## 2022-04-06 DIAGNOSIS — R278 Other lack of coordination: Secondary | ICD-10-CM

## 2022-04-06 DIAGNOSIS — R2681 Unsteadiness on feet: Secondary | ICD-10-CM

## 2022-04-06 DIAGNOSIS — R29898 Other symptoms and signs involving the musculoskeletal system: Secondary | ICD-10-CM

## 2022-04-06 DIAGNOSIS — R29818 Other symptoms and signs involving the nervous system: Secondary | ICD-10-CM

## 2022-04-06 DIAGNOSIS — M25611 Stiffness of right shoulder, not elsewhere classified: Secondary | ICD-10-CM

## 2022-04-06 DIAGNOSIS — M25612 Stiffness of left shoulder, not elsewhere classified: Secondary | ICD-10-CM

## 2022-04-06 DIAGNOSIS — R293 Abnormal posture: Secondary | ICD-10-CM

## 2022-04-06 NOTE — Patient Instructions (Signed)
Ways to Prevent Future Parkinson's-Related Complications:  1.   EXERCISE regularly.  Perform your therapy exercises and incorporate safe aerobic exercise when possible (swimming, stationary bike, arm bike, seated stepper)  2.   Keep you FEET APART when you are standing (or sitting) to allow you to have better balance and reach further (which can help with shoulder rigidity).  Also make sure your feet are apart when you are sitting before you stand up.  3.   Focus on BIGGER movements during daily activities--deliberately reach overhead, straighten elbows and extend fingers  4.   Anytime you reach or move shoulder, make sure you have good upright POSTURE.  5.   When dressing or reaching for your seatbelt make sure to use your body to assist by TWISTING and LOOKING at where you are reaching while you reach--this can help to minimize stress on the shoulder and reduce the risk of a rotator cuff tear, gives visual feedback to the brain about movement size and where your arm is in space, makes eye muscles more active, and helps with neck/trunk rigidity  6.  When you reach for something overhead, make sure your THUMB is facing UP.  This is a better position for your shoulder.  7.  SWING YOUR ARMS when you walk! (If able).  People with PD are at increased risk for frozen shoulder and swinging your arms can reduce this risk.

## 2022-04-08 NOTE — Therapy (Signed)
OUTPATIENT OCCUPATIONAL THERAPY PARKINSON'S TREATMENT  Patient Name: Zachary Wang. MRN: [id number] DOB:1941/03/13, 81 y.o., male Today's Date: 04/08/2022  PCP: Dr. Betsey Amen REFERRING PROVIDER: Dr. Wells Guiles Tat          Past Medical History:  Diagnosis Date   Arthritis    hands   BPH (benign prostatic hyperplasia)    Elevated PSA    Foley catheter in place    History of adenomatous polyp of colon    2014  tubular adenoma   History of malignant melanoma    s/p  excision left forearm and left axill lymph node bx 09-10-2004   Hyperlipidemia    Hypertension    Peripheral vascular disease [facility])    Urinary retention    Past Surgical History:  Procedure Laterality Date   [place]   COLONOSCOPY N/A 05/02/2013   Procedure: COLONOSCOPY;  Surgeon: Garlan Fair, MD;  Location: [facility];  Service: Endoscopy;  Laterality: N/A;   INSERTION OF SUPRAPUBIC CATHETER N/A 09/30/2015   Procedure: INSERTION OF SUPRAPUBIC CATHETER;  Surgeon: Kathie Rhodes, MD;  Location: [facility];  Service: Urology;  Laterality: N/A;   SHOULDER ARTHROSCOPY W/ SUBACROMIAL DECOMPRESSION AND DISTAL CLAVICLE EXCISION Left 05-20-2010   w/  Debridement , Bursectomy, Acromioplasty,  Capsulectomy   TRANSURETHRAL RESECTION OF PROSTATE N/A 09/30/2015   Procedure: TURP (TRANSURETHRAL RESECTION OF PROSTATE WITH GYRUS;  Surgeon: Kathie Rhodes, MD;  Location: [place];  Service: Urology;  Laterality: N/A;   WIDE EXCISION MALIGNANT MELANOMA LEFT FOREARM/  LEFT AXILLA LYMPH NODE BX  09-10-2004   Patient Active Problem List   Diagnosis Date Noted   Parkinson's disease ([place]) 04/13/2017   BPH (benign prostatic hypertrophy) with urinary retention 09/30/2015    ONSET DATE: 12/09/21 PD Screen date  REFERRING DIAG: G20 (ICD-10-CM) - Parkinson's disease ([place])   THERAPY DIAG:  No diagnosis found.  Rationale for Evaluation and Treatment  Rehabilitation  SUBJECTIVE:   SUBJECTIVE STATEMENT: *** Pt reports improvement putting golf ball on tee.   Pt accompanied by: self  PERTINENT HISTORY: arthritis, hx of malignant melanoma, HDL, HTN, Peripheral vascular disease, hx of L shoudler arthroscopy 2011, bilateral cataract surgery 10/2019, hx of limited L wrist flex  PRECAUTIONS: Fall  PAIN:  Are you having pain? 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, PD cycling class, golf 2x/wk (min difficulty with putting golf ball on tee with tremors), occasional yoga class [facility])  PATIENT GOALS improve coordination, ADLs   OBJECTIVE:   TODAY'S TREATMENT:   ***   With both hands:  with palm on table, individual finger lifts with abduction/adduction, then isolated/individual finger lifts for incr MP ext. With min v.c. for incr movement amplitude.  Isolated IP flexion/ext for decr rigidity of hands with min v.c.    Forearm gym with each UE for incr wrist ROM with large amplitude, min difficulty/cueing for compensation.   Pulling bag into palm of each hand with min cueing for large amplitude movements--improved today (to simulate clothing adjustment/donning socks).    Standing, [employer] to reach and touch targets with focus/set-up for large amplitude movements.    Closed-chain shoulder flex in sitting, floor>overhead followed by performing with sit>stand, then in standing in diagonal patterns with lateral wt. Shift, trunk rotations with min cueing for all for large amplitude movements.  Checked STGs and discussed progress.--see below  PATIENT EDUCATION: Education details:  *** Ways to decr risk of future complications related to PD  Person educated: Patient Education method: Explanation, Demonstration, and Verbal cues Education comprehension: verbalized understanding   HOME EXERCISE  PROGRAM: 03/31/22:  [employer] Hands (review & re-issue); Other Hand HEP for ROM/coordination (finger walking for abduction, individual finger lifts, isolated IP flex); review and updated coordination HEP (see pt instructions--added to perform with BUEs simultaneously)   04/06/22:  Ways to decr risk of future complications related to PD  GOALS: Potential Goals reviewed with patient? Yes  SHORT TERM GOALS: Target date: 04/23/2022    Pt will be independent with updated PD-specific HEP.  Goal status: ONGOING  04/06/22  2.  Pt will demo at least 120* R shoulder flexion for overhead reaching. Baseline:  110*  Goal status: MET.  04/06/22:  130*, -5* elbow ext.   3.  Pt will improve L hand coordination for ADLs as shown by improving time on 9-hole peg test by at least 3sec.  Baseline:  36.19sec Goal status: ONGOING.  04/06/22:  45.40sec, 44.82sec.  4.  Pt will verbalize understanding of ways to decr risk of future complications related to PD. Goal status: MET.  04/06/22    LONG TERM GOALS: Target date: 06/02/2022    Pt will verbalize understanding of adaptive strategies to incr ease, safety, and independence with ADLs/IADLs (including eating, opening containers, sit>stand from various surfaces, picking up items from floor, dressing, putting golf ball on tee, flipping eggs).   Goal status: INITIAL  2.  Pt will demo at least 120* R shoulder flexion for overhead reaching with at least -15* elbow ext.  Baseline:  110* with -20* elbow ext Goal status: MET.  04/06/22:  130* with -5* elbow ext.  (LUE 130* with elbow ext WNL)  3.  Pt will improve L hand coordination for ADLs as shown by improving time on 9-hole peg test by at least 3sec.  Baseline:  36.19sec Goal status: INITIAL  4.  Pt will improve bilateral hand coordination as shown by fastening/unfastening 3 buttons in 48sec or less. Baseline:   52.56sec Goal status: INITIAL  5.  Pt will improve functional reaching/coordination as shown by improving  score on bilateral box and blocks test by 3 with each UE. Goal status: INITIAL   ASSESSMENT:  CLINICAL IMPRESSION: *** Pt is progressing towards goals with improving large amplitude movements with functional tasks and isolated finger movements with L hand with min cueing and repetition.  Pt also demo incr bilateral shoulder ROM, but continues to demo difficulty/compensation with in-hand manipulation.   PERFORMANCE DEFICITS in functional skills including ADLs, IADLs, coordination, tone, ROM, flexibility, FMC, GMC, mobility, balance, and UE functional use, and psychosocial skills including habits.   IMPAIRMENTS are limiting patient from ADLs, IADLs, and leisure.   COMORBIDITIES may have co-morbidities  that affects occupational performance. Patient will benefit from skilled OT to address above impairments and improve overall function.  MODIFICATION OR ASSISTANCE TO COMPLETE EVALUATION: Min-Moderate modification of tasks or assist with assess necessary to complete an evaluation.  OT OCCUPATIONAL PROFILE AND HISTORY: Detailed assessment: Review of records and additional review of physical, cognitive, psychosocial history related to current functional performance.  CLINICAL DECISION MAKING: Moderate - several treatment options, min-mod task modification necessary  REHAB POTENTIAL: Good  EVALUATION COMPLEXITY: Moderate    PLAN: OT FREQUENCY: 2x/week  OT DURATION:  x8 weeks or 16 sessions + eval over 12 weeks (due to scheduling)  PLANNED INTERVENTIONS: self care/ADL training, therapeutic exercise,  therapeutic activity, neuromuscular re-education, manual therapy, passive range of motion, balance training, functional mobility training, aquatic therapy, electrical stimulation, ultrasound, fluidotherapy, moist heat, cryotherapy, patient/family education, and DME and/or AE instructions  RECOMMENDED OTHER SERVICES: none at this time  CONSULTED AND AGREED WITH PLAN OF CARE: Patient  PLAN FOR  NEXT SESSION:    *** work on posture (avoid compensation), in-hand manipulation without compensation/coordination, ADL strategies   Damyon Mullane, OTR/L 04/08/2022, 9:16 PM

## 2022-04-09 ENCOUNTER — Ambulatory Visit: Payer: No Typology Code available for payment source | Admitting: Occupational Therapy

## 2022-04-09 DIAGNOSIS — R293 Abnormal posture: Secondary | ICD-10-CM

## 2022-04-09 DIAGNOSIS — R278 Other lack of coordination: Secondary | ICD-10-CM

## 2022-04-09 DIAGNOSIS — R2681 Unsteadiness on feet: Secondary | ICD-10-CM

## 2022-04-09 DIAGNOSIS — R251 Tremor, unspecified: Secondary | ICD-10-CM

## 2022-04-09 DIAGNOSIS — R471 Dysarthria and anarthria: Secondary | ICD-10-CM

## 2022-04-09 DIAGNOSIS — M25612 Stiffness of left shoulder, not elsewhere classified: Secondary | ICD-10-CM

## 2022-04-09 DIAGNOSIS — R29818 Other symptoms and signs involving the nervous system: Secondary | ICD-10-CM

## 2022-04-09 DIAGNOSIS — M25611 Stiffness of right shoulder, not elsewhere classified: Secondary | ICD-10-CM

## 2022-04-09 DIAGNOSIS — R29898 Other symptoms and signs involving the musculoskeletal system: Secondary | ICD-10-CM

## 2022-04-13 ENCOUNTER — Ambulatory Visit: Payer: No Typology Code available for payment source | Admitting: Occupational Therapy

## 2022-04-13 DIAGNOSIS — R278 Other lack of coordination: Secondary | ICD-10-CM

## 2022-04-13 DIAGNOSIS — R29898 Other symptoms and signs involving the musculoskeletal system: Secondary | ICD-10-CM

## 2022-04-13 DIAGNOSIS — R2681 Unsteadiness on feet: Secondary | ICD-10-CM

## 2022-04-13 DIAGNOSIS — R251 Tremor, unspecified: Secondary | ICD-10-CM

## 2022-04-13 DIAGNOSIS — M25611 Stiffness of right shoulder, not elsewhere classified: Secondary | ICD-10-CM

## 2022-04-13 DIAGNOSIS — R293 Abnormal posture: Secondary | ICD-10-CM

## 2022-04-13 DIAGNOSIS — M25612 Stiffness of left shoulder, not elsewhere classified: Secondary | ICD-10-CM

## 2022-04-13 DIAGNOSIS — R29818 Other symptoms and signs involving the nervous system: Secondary | ICD-10-CM

## 2022-04-13 NOTE — Therapy (Signed)
OUTPATIENT OCCUPATIONAL THERAPY [facility]'S [facility]REATMENT  Patient Name: Zachary Wang. MRN: [id number] DOB:[birth year], 81 y.o., male Today's Date: 04/13/2022  PCP: Dr. Betsey Amen REFERRING PROVIDER: Dr. Wells Guiles Tat   OT End of Session - 04/13/22 0826     Visit Number 8    Number of Visits 17    Date for OT Re-Evaluation 06/08/22    Authorization Type [facility] Auth  6/30-10/28/23    Authorization - Visit Number 8    Authorization - Number of Visits 10    Progress Note Due on Visit 10    OT Start Time 0803    OT Stop Time 0845    OT Time Calculation (min) 42 min    Activity Tolerance Patient tolerated treatment well    Behavior During Therapy [facility] for tasks assessed/performed                    Past Medical History:  Diagnosis Date   Arthritis    hands   BPH (benign prostatic hyperplasia)    Elevated PSA    Foley catheter in place    History of adenomatous polyp of colon    2014  tubular adenoma   History of malignant melanoma    s/p  excision left forearm and left axill lymph node bx 09-10-2004   Hyperlipidemia    Hypertension    Peripheral vascular disease [facility])    Urinary retention    Past Surgical History:  Procedure Laterality Date   [place]   COLONOSCOPY N/A 05/02/2013   Procedure: COLONOSCOPY;  Surgeon: Garlan Fair, MD;  Location: [facility];  Service: Endoscopy;  Laterality: N/A;   INSERTION OF SUPRAPUBIC CATHETER N/A 09/30/2015   Procedure: INSERTION OF SUPRAPUBIC CATHETER;  Surgeon: Kathie Rhodes, MD;  Location[facility];  Service: Urology;  Laterality: N/A;   SHOULDER ARTHROSCOPY W/ SUBACROMIAL DECOMPRESSION AND DISTAL CLAVICLE EXCISION Left 05-20-2010   w/  Debridement , Bursectomy, Acromioplasty,  Capsulectomy   TRANSURETHRAL RESECTION OF PROSTATE N/A 09/30/2015   Procedure: TURP (TRANSURETHRAL RESECTION OF PROSTATE WITH GYRUS;  Surgeon: Kathie Rhodes, MD;  Location: [place];  Service:  Urology;  Laterality: N/A;   WIDE EXCISION MALIGNANT MELANOMA LEFT FOREARM/  LEFT AXILLA LYMPH NODE BX  09-10-2004   Patient Active Problem List   Diagnosis Date Noted   Parkinson's disease ([place]) 04/13/2017   BPH (benign prostatic hypertrophy) with urinary retention 09/30/2015    ONSET DATE: 12/09/21 PD Screen date  REFERRING DIAG: G20 (ICD-10-CM) - Parkinson's disease ([place])   THERAPY DIAG:  Other symptoms and signs involving the nervous system  Other symptoms and signs involving the musculoskeletal system  Abnormal posture  Other lack of coordination  Stiffness of right shoulder, not elsewhere classified  Stiffness of left shoulder, not elsewhere classified  Tremor  Unsteadiness on feet  Rationale for Evaluation and Treatment Rehabilitation  SUBJECTIVE:   SUBJECTIVE STATEMENT: "Wow, this is hard"   Pt accompanied by: self  PERTINENT HISTORY: arthritis, hx of malignant melanoma, HDL, HTN, Peripheral vascular disease, hx of L shoudler arthroscopy 2011, bilateral cataract surgery 10/2019, hx of limited L wrist flex  PRECAUTIONS: Fall  PAIN:  Are you having pain? 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, PD cycling class, golf 2x/wk (min difficulty with putting golf ball on tee with tremors), occasional yoga class [facility])  PATIENT GOALS improve coordination,  ADLs   OBJECTIVE:   TODAY'S TREATMENT:  Standing for closed chain shoulder flexion and diagonals, min v.c for posture and amplitude Placing [employer] pegs in pegboard with each hand with in-hand manipulation with min-mod difficulty R hand and mod difficultly with L hand.  Flipping playing cards with left and right hands simultaneously , min difficulty/ v.c for upright posture, and amplitude    Dealing playing cards with left and right hands individually, min v.c for amplitude   Forearm gym  with each UE for incr wrist ROM with large amplitude, min difficulty/cueing for compensation.    Ambulating while rotating 2 golf balls in hand, and performing category generation for cognitive component, min v.c, and mod difficulty rotating with right hand, max difficulty with left hand   PATIENT EDUCATION: N/a  HOME EXERCISE PROGRAM: 03/31/22:  [employer] Hands (review & re-issue); Other Hand HEP for ROM/coordination (finger walking for abduction, individual finger lifts, isolated IP flex); review and updated coordination HEP (see pt instructions--added to perform with BUEs simultaneously)   04/06/22:  Ways to decr risk of future complications related to PD  GOALS: Potential Goals reviewed with patient? Yes  SHORT TERM GOALS: Target date: 04/23/2022    Pt will be independent with updated PD-specific HEP.  Goal status: ONGOING  04/06/22  2.  Pt will demo at least 120* R shoulder flexion for overhead reaching. Baseline:  110*  Goal status: MET.  04/06/22:  130*, -5* elbow ext.   3.  Pt will improve L hand coordination for ADLs as shown by improving time on 9-hole peg test by at least 3sec.  Baseline:  36.19sec Goal status: ONGOING.  04/06/22:  45.40sec, 44.82sec.  4.  Pt will verbalize understanding of ways to decr risk of future complications related to PD. Goal status: MET.  04/06/22    LONG TERM GOALS: Target date: 06/02/2022    Pt will verbalize understanding of adaptive strategies to incr ease, safety, and independence with ADLs/IADLs (including eating, opening containers, sit>stand from various surfaces, picking up items from floor, dressing, putting golf ball on tee, flipping eggs).   Goal status: INITIAL  2.  Pt will demo at least 120* R shoulder flexion for overhead reaching with at least -15* elbow ext.  Baseline:  110* with -20* elbow ext Goal status: MET.  04/06/22:  130* with -5* elbow ext.  (LUE 130* with elbow ext WNL)  3.  Pt will improve L hand coordination for ADLs as  shown by improving time on 9-hole peg test by at least 3sec.  Baseline:  36.19sec Goal status: INITIAL  4.  Pt will improve bilateral hand coordination as shown by fastening/unfastening 3 buttons in 48sec or less. Baseline:   52.56sec Goal status: INITIAL  5.  Pt will improve functional reaching/coordination as shown by improving score on bilateral box and blocks test by 3 with each UE. Goal status: INITIAL   ASSESSMENT:  CLINICAL IMPRESSION: Pt is progressing towards goals. He continues to require v.c for amplitude during functional tasks particularly with LUE.  PERFORMANCE DEFICITS in functional skills including ADLs, IADLs, coordination, tone, ROM, flexibility, FMC, GMC, mobility, balance, and UE functional use, and psychosocial skills including habits.   IMPAIRMENTS are limiting patient from ADLs, IADLs, and leisure.   COMORBIDITIES may have co-morbidities  that affects occupational performance. Patient will benefit from skilled OT to address above impairments and improve overall function.  MODIFICATION OR ASSISTANCE TO COMPLETE EVALUATION: Min-Moderate modification of tasks or assist with assess necessary to complete an  evaluation.  OT OCCUPATIONAL PROFILE AND HISTORY: Detailed assessment: Review of records and additional review of physical, cognitive, psychosocial history related to current functional performance.  CLINICAL DECISION MAKING: Moderate - several treatment options, min-mod task modification necessary  REHAB POTENTIAL: Good  EVALUATION COMPLEXITY: Moderate    PLAN: OT FREQUENCY: 2x/week  OT DURATION:  x8 weeks or 16 sessions + eval over 12 weeks (due to scheduling)  PLANNED INTERVENTIONS: self care/ADL training, therapeutic exercise, therapeutic activity, neuromuscular re-education, manual therapy, passive range of motion, balance training, functional mobility training, aquatic therapy, electrical stimulation, ultrasound, fluidotherapy, moist heat,  cryotherapy, patient/family education, and DME and/or AE instructions  RECOMMENDED OTHER SERVICES: none at this time  CONSULTED AND AGREED WITH PLAN OF CARE: Patient  PLAN FOR NEXT SESSION:    continue to work on posture (avoid compensation), in-hand manipulation without compensation/coordination, ADL strategies  Theone Murdoch, OTR/L [phone] Phone: [phone] 9:16 AM 04/13/22  Lanasia Porras, OTR/L 04/13/2022, 8:41 AM

## 2022-04-16 ENCOUNTER — Ambulatory Visit: Payer: No Typology Code available for payment source | Admitting: Occupational Therapy

## 2022-04-16 ENCOUNTER — Encounter: Payer: Self-pay | Admitting: Occupational Therapy

## 2022-04-16 DIAGNOSIS — R278 Other lack of coordination: Secondary | ICD-10-CM

## 2022-04-16 DIAGNOSIS — M25611 Stiffness of right shoulder, not elsewhere classified: Secondary | ICD-10-CM

## 2022-04-16 DIAGNOSIS — R29818 Other symptoms and signs involving the nervous system: Secondary | ICD-10-CM

## 2022-04-16 DIAGNOSIS — R251 Tremor, unspecified: Secondary | ICD-10-CM

## 2022-04-16 DIAGNOSIS — R29898 Other symptoms and signs involving the musculoskeletal system: Secondary | ICD-10-CM

## 2022-04-16 DIAGNOSIS — R293 Abnormal posture: Secondary | ICD-10-CM

## 2022-04-16 DIAGNOSIS — M25612 Stiffness of left shoulder, not elsewhere classified: Secondary | ICD-10-CM

## 2022-04-16 DIAGNOSIS — R2681 Unsteadiness on feet: Secondary | ICD-10-CM

## 2022-04-16 NOTE — Therapy (Signed)
OUTPATIENT OCCUPATIONAL THERAPY [facility]  Patient Name: Zachary Wang. MRN: [id number] DOB:Jun 14, 1941, 81 y.o., male Today's Date: 04/16/2022  PCP: Dr. Betsey Amen REFERRING PROVIDER: Dr. Wells Guiles Tat   OT End of Session - 04/16/22 0856     Visit Number 9    Number of Visits 17    Date for OT Re-Evaluation 06/08/22    Authorization Type [place] Auth  6/30-10/28/23    Authorization - Visit Number 9    Authorization - Number of Visits 10    Progress Note Due on Visit 10    OT Start Time 0853    OT Stop Time 0931    OT Time Calculation (min) 38 min    Activity Tolerance Patient tolerated treatment well    Behavior During Therapy [facility] for tasks assessed/performed                     Past Medical History:  Diagnosis Date   Arthritis    hands   BPH (benign prostatic hyperplasia)    Elevated PSA    Foley catheter in place    History of adenomatous polyp of colon    2014  tubular adenoma   History of malignant melanoma    s/p  excision left forearm and left axill lymph node bx 09-10-2004   Hyperlipidemia    Hypertension    Peripheral vascular disease [facility])    Urinary retention    Past Surgical History:  Procedure Laterality Date   [place]   COLONOSCOPY N/A 05/02/2013   Procedure: COLONOSCOPY;  Surgeon: Garlan Fair, MD;  Location: [facility];  Service: Endoscopy;  Laterality: N/A;   INSERTION OF SUPRAPUBIC CATHETER N/A 09/30/2015   Procedure: INSERTION OF SUPRAPUBIC CATHETER;  Surgeon: Kathie Rhodes, MD;  Location: [facility];  Service: Urology;  Laterality: N/A;   SHOULDER ARTHROSCOPY W/ SUBACROMIAL DECOMPRESSION AND DISTAL CLAVICLE EXCISION Left 05-20-2010   w/  Debridement , Bursectomy, Acromioplasty,  Capsulectomy   TRANSURETHRAL RESECTION OF PROSTATE N/A 09/30/2015   Procedure: TURP (TRANSURETHRAL RESECTION OF PROSTATE WITH GYRUS;  Surgeon: Kathie Rhodes, MD;  Location: [place];  Service:  Urology;  Laterality: N/A;   WIDE EXCISION MALIGNANT MELANOMA LEFT FOREARM/  LEFT AXILLA LYMPH NODE BX  09-10-2004   Patient Active Problem List   Diagnosis Date Noted   Parkinson's disease ([place]) 04/13/2017   BPH (benign prostatic hypertrophy) with urinary retention 09/30/2015    ONSET DATE: 12/09/21 PD Screen date  REFERRING DIAG: G20 (ICD-10-CM) - Parkinson's disease ([place])   THERAPY DIAG:  Other symptoms and signs involving the nervous system  Other symptoms and signs involving the musculoskeletal system  Abnormal posture  Other lack of coordination  Stiffness of right shoulder, not elsewhere classified  Stiffness of left shoulder, not elsewhere classified  Tremor  Unsteadiness on feet  Rationale for Evaluation and Treatment Rehabilitation  SUBJECTIVE:   SUBJECTIVE STATEMENT: Doing good   Pt accompanied by: self  PERTINENT HISTORY: arthritis, hx of malignant melanoma, HDL, HTN, Peripheral vascular disease, hx of L shoudler arthroscopy 2011, bilateral cataract surgery 10/2019, hx of limited L wrist flex  PRECAUTIONS: Fall  PAIN:  Are you having pain? 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, PD cycling class, golf 2x/wk (min difficulty with putting golf ball on tee with tremors), occasional yoga class [facility])  PATIENT GOALS improve coordination, ADLs  OBJECTIVE:   TODAY'S TREATMENT:   Forearm gym with each UE for incr wrist ROM with large amplitude, min difficulty/cueing for compensation.  With both hands:  with palm on table, individual finger lifts with abduction/adduction, then isolated/individual finger lifts for incr MP ext. With min v.c. for incr movement amplitude.  Isolated IP flexion/ext for decr rigidity of hands with min v.c.  Sliding cards across table with each hand with use of PWR! Hands with min cueing, particularly L hand  for incr movement amplitude  Picking up 5 pegs in each hand and manipulate in-hand to place in small pegboard to copy design.  Min cueing for large amplitude movement strategies and mod difficulty, particularly with L hand. Removing with each finger and thumb 1 at a time with min difficulty R hand, mod difficulty L hand (unable with 5th digit).    HOME EXERCISE PROGRAM: 03/31/22:  PWR! Hands (review & re-issue); Other Hand HEP for ROM/coordination (finger walking for abduction, individual finger lifts, isolated IP flex); review and updated coordination HEP (see pt instructions--added to perform with BUEs simultaneously)   04/06/22:  Ways to decr risk of future complications related to PD  GOALS: Potential Goals reviewed with patient? Yes  SHORT TERM GOALS: Target date: 04/23/2022    Pt will be independent with updated PD-specific HEP.  Goal status: ONGOING  04/06/22  2.  Pt will demo at least 120* R shoulder flexion for overhead reaching. Baseline:  110*  Goal status: MET.  04/06/22:  130*, -5* elbow ext.   3.  Pt will improve L hand coordination for ADLs as shown by improving time on 9-hole peg test by at least 3sec.  Baseline:  36.19sec Goal status: ONGOING.  04/06/22:  45.40sec, 44.82sec.  4.  Pt will verbalize understanding of ways to decr risk of future complications related to PD. Goal status: MET.  04/06/22    LONG TERM GOALS: Target date: 06/02/2022    Pt will verbalize understanding of adaptive strategies to incr ease, safety, and independence with ADLs/IADLs (including eating, opening containers, sit>stand from various surfaces, picking up items from floor, dressing, putting golf ball on tee, flipping eggs).   Goal status: INITIAL  2.  Pt will demo at least 120* R shoulder flexion for overhead reaching with at least -15* elbow ext.  Baseline:  110* with -20* elbow ext Goal status: MET.  04/06/22:  130* with -5* elbow ext.  (LUE 130* with elbow ext WNL)  3.  Pt will improve L  hand coordination for ADLs as shown by improving time on 9-hole peg test by at least 3sec.  Baseline:  36.19sec Goal status: INITIAL  4.  Pt will improve bilateral hand coordination as shown by fastening/unfastening 3 buttons in 48sec or less. Baseline:   52.56sec Goal status: INITIAL  5.  Pt will improve functional reaching/coordination as shown by improving score on bilateral box and blocks test by 3 with each UE. Goal status: INITIAL   ASSESSMENT:  CLINICAL IMPRESSION: Pt is progressing towards goals. He continues to require v.c for amplitude during functional tasks particularly with LUE.  PERFORMANCE DEFICITS in functional skills including ADLs, IADLs, coordination, tone, ROM, flexibility, FMC, GMC, mobility, balance, and UE functional use, and psychosocial skills including habits.   IMPAIRMENTS are limiting patient from ADLs, IADLs, and leisure.   COMORBIDITIES may have co-morbidities  that affects occupational performance. Patient will benefit from skilled OT to address above impairments and improve overall function.  MODIFICATION OR ASSISTANCE TO COMPLETE EVALUATION: Min-Moderate  modification of tasks or assist with assess necessary to complete an evaluation.  OT OCCUPATIONAL PROFILE AND HISTORY: Detailed assessment: Review of records and additional review of physical, cognitive, psychosocial history related to current functional performance.  CLINICAL DECISION MAKING: Moderate - several treatment options, min-mod task modification necessary  REHAB POTENTIAL: Good  EVALUATION COMPLEXITY: Moderate    PLAN: OT FREQUENCY: 2x/week  OT DURATION:  x8 weeks or 16 sessions + eval over 12 weeks (due to scheduling)  PLANNED INTERVENTIONS: self care/ADL training, therapeutic exercise, therapeutic activity, neuromuscular re-education, manual therapy, passive range of motion, balance training, functional mobility training, aquatic therapy, electrical stimulation, ultrasound,  fluidotherapy, moist heat, cryotherapy, patient/family education, and DME and/or AE instructions  RECOMMENDED OTHER SERVICES: none at this time  CONSULTED AND AGREED WITH PLAN OF CARE: Patient  PLAN FOR NEXT SESSION:    continue to work on posture (avoid compensation), in-hand manipulation without compensation/coordination, ADL strategies   Prakriti Carignan, OTR/L 04/16/2022, 9:05 AM

## 2022-04-17 ENCOUNTER — Ambulatory Visit (INDEPENDENT_AMBULATORY_CARE_PROVIDER_SITE_OTHER): Payer: No Typology Code available for payment source | Admitting: Neurology

## 2022-04-17 DIAGNOSIS — K117 Disturbances of salivary secretion: Secondary | ICD-10-CM | POA: Diagnosis not present

## 2022-04-17 MED ORDER — RIMABOTULINUMTOXINB 5000 UNIT/ML IM SOLN
5000.0000 [IU] | Freq: Once | INTRAMUSCULAR | Status: AC
  Administered 2022-04-17: 5000 [IU] via INTRAMUSCULAR

## 2022-04-17 NOTE — Procedures (Signed)
[facility]    History:  Diagnosis: Sialorrhea    Result History  Working well.  Noting wearing off  Consent obtained from: The patient The patient was educated on the botulinum toxin the black blox warning and given a copy of the botox patient medication guide.  The patient understands that this warning states that there have been reported cases of the Botox extending beyond the injection site and creating adverse effects, similar to those of botulism. This included loss of strength, trouble walking, hoarseness, trouble saying words clearly, loss of bladder control, trouble breathing, trouble swallowing, diplopia, blurry vision and ptosis. Most of the distant spread of Botox was happening in patients, primarily children, who received medication for spasticity or for cervical dystonia. The patient expressed understanding and desire to proceed.     Injections  Location Left  Right Units Number of sites  Submandibular gland 250 250 500 1 per side  Parotid 2250 2250 2500 1 per side  TOTAL UNITS:     5000      Type of Toxin: Myobloc type B As ordered and injected IM at today's visit Total Units: 5000  Discarded Units: 0  Needle drawback with each injection was free of blood. Pt tolerated procedure well without complications.   Reinjection is anticipated in 3 months.                 

## 2022-04-21 ENCOUNTER — Encounter: Payer: No Typology Code available for payment source | Admitting: Occupational Therapy

## 2022-04-23 ENCOUNTER — Encounter: Payer: No Typology Code available for payment source | Admitting: Occupational Therapy

## 2022-04-27 ENCOUNTER — Encounter: Payer: No Typology Code available for payment source | Admitting: Occupational Therapy

## 2022-04-28 ENCOUNTER — Encounter: Payer: Self-pay | Admitting: Occupational Therapy

## 2022-04-28 ENCOUNTER — Ambulatory Visit: Payer: No Typology Code available for payment source | Attending: Internal Medicine | Admitting: Occupational Therapy

## 2022-04-28 DIAGNOSIS — R251 Tremor, unspecified: Secondary | ICD-10-CM | POA: Diagnosis present

## 2022-04-28 DIAGNOSIS — R2681 Unsteadiness on feet: Secondary | ICD-10-CM | POA: Diagnosis present

## 2022-04-28 DIAGNOSIS — R293 Abnormal posture: Secondary | ICD-10-CM | POA: Insufficient documentation

## 2022-04-28 DIAGNOSIS — M25611 Stiffness of right shoulder, not elsewhere classified: Secondary | ICD-10-CM | POA: Diagnosis present

## 2022-04-28 DIAGNOSIS — M25612 Stiffness of left shoulder, not elsewhere classified: Secondary | ICD-10-CM | POA: Insufficient documentation

## 2022-04-28 DIAGNOSIS — R29818 Other symptoms and signs involving the nervous system: Secondary | ICD-10-CM | POA: Diagnosis present

## 2022-04-28 DIAGNOSIS — R278 Other lack of coordination: Secondary | ICD-10-CM | POA: Diagnosis present

## 2022-04-28 DIAGNOSIS — R29898 Other symptoms and signs involving the musculoskeletal system: Secondary | ICD-10-CM | POA: Diagnosis present

## 2022-04-28 NOTE — Therapy (Signed)
OUTPATIENT OCCUPATIONAL THERAPY [facility]  Patient Name: Zachary Wang. MRN: [id number] DOB:14-Apr-1941, 81 y.o., male Today's Date: 04/28/2022  PCP: Dr. Betsey Amen REFERRING PROVIDER: Dr. Wells Guiles Tat   OT End of Session - 04/28/22 0852     Visit Number 10    Number of Visits 17    Date for OT Re-Evaluation 06/08/22    Authorization Type [place] Auth  6/30-10/28/23    Authorization - Visit Number 10    Authorization - Number of Visits 10    Progress Note Due on Visit 10    OT Start Time 0850    OT Stop Time 0930    OT Time Calculation (min) 40 min    Activity Tolerance Patient tolerated treatment well    Behavior During Therapy [facility] for tasks assessed/performed                      Past Medical History:  Diagnosis Date   Arthritis    hands   BPH (benign prostatic hyperplasia)    Elevated PSA    Foley catheter in place    History of adenomatous polyp of colon    2014  tubular adenoma   History of malignant melanoma    s/p  excision left forearm and left axill lymph node bx 09-10-2004   Hyperlipidemia    Hypertension    Peripheral vascular disease [facility])    Urinary retention    Past Surgical History:  Procedure Laterality Date   ANAL FISSURE REPAIR  1984   COLONOSCOPY N/A 05/02/2013   Procedure: COLONOSCOPY;  Surgeon: Garlan Fair, MD;  Location: [facility];  Service: Endoscopy;  Laterality: N/A;   INSERTION OF SUPRAPUBIC CATHETER N/A 09/30/2015   Procedure: INSERTION OF SUPRAPUBIC CATHETER;  Surgeon: Kathie Rhodes, MD;  Location[facility];  Service: Urology;  Laterality: N/A;   SHOULDER ARTHROSCOPY W/ SUBACROMIAL DECOMPRESSION AND DISTAL CLAVICLE EXCISION Left 05-20-2010   w/  Debridement , Bursectomy, Acromioplasty,  Capsulectomy   TRANSURETHRAL RESECTION OF PROSTATE N/A 09/30/2015   Procedure: TURP (TRANSURETHRAL RESECTION OF PROSTATE WITH GYRUS;  Surgeon: Kathie Rhodes, MD;  Location: [place];   Service: Urology;  Laterality: N/A;   WIDE EXCISION MALIGNANT MELANOMA LEFT FOREARM/  LEFT AXILLA LYMPH NODE BX  09-10-2004   Patient Active Problem List   Diagnosis Date Noted   Parkinson's disease ([place]) 04/13/2017   BPH (benign prostatic hypertrophy) with urinary retention 09/30/2015    ONSET DATE: 12/09/21 PD Screen date  REFERRING DIAG: G20 (ICD-10-CM) - Parkinson's disease ([place])   THERAPY DIAG:  Other symptoms and signs involving the nervous system  Other symptoms and signs involving the musculoskeletal system  Abnormal posture  Other lack of coordination  Stiffness of right shoulder, not elsewhere classified  Stiffness of left shoulder, not elsewhere classified  Tremor  Unsteadiness on feet  Rationale for Evaluation and Treatment Rehabilitation  SUBJECTIVE:   SUBJECTIVE STATEMENT: "That's hard--pegs"  "the buttons are better"  Pt accompanied by: self  PERTINENT HISTORY: arthritis, hx of malignant melanoma, HDL, HTN, Peripheral vascular disease, hx of L shoudler arthroscopy 2011, bilateral cataract surgery 10/2019, hx of limited L wrist flex  PRECAUTIONS: Fall  PAIN:  Are you having pain? 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, PD cycling class, golf 2x/wk (min difficulty with putting golf ball on tee with tremors), occasional yoga class ([place])  PATIENT GOALS improve coordination, ADLs   OBJECTIVE:   TODAY'S TREATMENT:   With both hands:  with palm on table, individual finger lifts with abduction/adduction, then isolated/individual finger lifts for incr MP ext. Then individual finger flex with palm on table and PWR! Step With min v.c. for incr movement amplitude.  Isolated IP flexion/ext for decr rigidity of hands with min v.c.  Picking up 5 pegs in each hand and manipulate in-hand to place in small pegboard to copy design.  Min  cueing for large amplitude movement strategies and mod difficulty, particularly with L hand. Removing with each finger and thumb 1 at a time with min difficulty R hand, mod difficulty L hand (unable with 5th digit).  Practicing fastening/unfastening buttons with min cueing for use of large amplitude movement strategies.--then checked goal (see below).  Discussed progress.       HOME EXERCISE PROGRAM: 03/31/22:  [employer]WR! Hands (review & re-issue); Other Hand HEP for ROM/coordination (finger walking for abduction, individual finger lifts, isolated IP flex); review and updated coordination HEP (see pt instructions--added to perform with BUEs simultaneously)   04/06/22:  Ways to decr risk of future complications related to PD  GOALS: Potential Goals reviewed with patient? Yes  SHORT TERM GOALS: Target date: 04/23/2022    Pt will be independent with updated PD-specific HEP.  Goal status: ONGOING  04/06/22  2.  Pt will demo at least 120* R shoulder flexion for overhead reaching. Baseline:  110*  Goal status: MET.  04/06/22:  130*, -5* elbow ext.   3.  Pt will improve L hand coordination for ADLs as shown by improving time on 9-hole peg test by at least 3sec.  Baseline:  36.19sec Goal status: ONGOING.  04/06/22:  45.40sec, 44.82sec.  4.  Pt will verbalize understanding of ways to decr risk of future complications related to PD. Goal status: MET.  04/06/22    LONG TERM GOALS: Target date: 06/02/2022    Pt will verbalize understanding of adaptive strategies to incr ease, safety, and independence with ADLs/IADLs (including eating, opening containers, sit>stand from various surfaces, picking up items from floor, dressing, putting golf ball on tee, flipping eggs).   Goal status: Ongoing.  Reviewed strategies for opening containers, sit>stand, flipping egg, picking up items from the floor, putting golf ball on tee  2.  Pt will demo at least 120* R shoulder flexion for overhead reaching with at least  -15* elbow ext.  Baseline:  110* with -20* elbow ext Goal status: MET.  04/06/22:  130* with -5* elbow ext.  (LUE 130* with elbow ext WNL)  3.  Pt will improve L hand coordination for ADLs as shown by improving time on 9-hole peg test by at least 3sec.  Baseline:  36.19sec Goal status: INITIAL  4.  Pt will improve bilateral hand coordination as shown by fastening/unfastening 3 buttons in 48sec or less. Baseline:   52.56sec Goal status: Met.  22.31sec  5.  Pt will improve functional reaching/coordination as shown by improving score on bilateral box and blocks test by 3 with each UE. Goal status: INITIAL   ASSESSMENT:  CLINICAL IMPRESSION: Progress Note Reporting Period 03/10/22-04/28/22:   Pt is progressing towards goals. He continues to require v.c for amplitude during functional tasks particularly with LUE, butp t now met LTG# 4 (significantly improved).  He would benefit from continued occupational therapy to maximize UE functional use, coordination, and for incr ease with ADLs.  PERFORMANCE DEFICITS in functional skills including ADLs, IADLs, coordination, tone,  ROM, flexibility, FMC, GMC, mobility, balance, and UE functional use, and psychosocial skills including habits.   IMPAIRMENTS are limiting patient from ADLs, IADLs, and leisure.   COMORBIDITIES may have co-morbidities  that affects occupational performance. Patient will benefit from skilled OT to address above impairments and improve overall function.  MODIFICATION OR ASSISTANCE TO COMPLETE EVALUATION: Min-Moderate modification of tasks or assist with assess necessary to complete an evaluation.  OT OCCUPATIONAL PROFILE AND HISTORY: Detailed assessment: Review of records and additional review of physical, cognitive, psychosocial history related to current functional performance.  CLINICAL DECISION MAKING: Moderate - several treatment options, min-mod task modification necessary  REHAB POTENTIAL: Good  EVALUATION COMPLEXITY:  Moderate    PLAN: OT FREQUENCY: 2x/week  OT DURATION:  x8 weeks or 16 sessions + eval over 12 weeks (due to scheduling)  PLANNED INTERVENTIONS: self care/ADL training, therapeutic exercise, therapeutic activity, neuromuscular re-education, manual therapy, passive range of motion, balance training, functional mobility training, aquatic therapy, electrical stimulation, ultrasound, fluidotherapy, moist heat, cryotherapy, patient/family education, and DME and/or AE instructions  RECOMMENDED OTHER SERVICES: none at this time  CONSULTED AND AGREED WITH PLAN OF CARE: Patient  PLAN FOR NEXT SESSION:    continue to work on posture (avoid compensation), in-hand manipulation without compensation/coordination, ADL strategies (scheduled through next week)   Christoffer Currier, OTR/L 04/28/2022, 8:52 AM

## 2022-04-30 ENCOUNTER — Encounter: Payer: Self-pay | Admitting: Occupational Therapy

## 2022-04-30 ENCOUNTER — Ambulatory Visit: Payer: No Typology Code available for payment source | Admitting: Occupational Therapy

## 2022-04-30 DIAGNOSIS — R278 Other lack of coordination: Secondary | ICD-10-CM

## 2022-04-30 DIAGNOSIS — M25612 Stiffness of left shoulder, not elsewhere classified: Secondary | ICD-10-CM

## 2022-04-30 DIAGNOSIS — R29898 Other symptoms and signs involving the musculoskeletal system: Secondary | ICD-10-CM

## 2022-04-30 DIAGNOSIS — R251 Tremor, unspecified: Secondary | ICD-10-CM

## 2022-04-30 DIAGNOSIS — R29818 Other symptoms and signs involving the nervous system: Secondary | ICD-10-CM | POA: Diagnosis not present

## 2022-04-30 DIAGNOSIS — M25611 Stiffness of right shoulder, not elsewhere classified: Secondary | ICD-10-CM

## 2022-04-30 DIAGNOSIS — R2681 Unsteadiness on feet: Secondary | ICD-10-CM

## 2022-04-30 DIAGNOSIS — R293 Abnormal posture: Secondary | ICD-10-CM

## 2022-04-30 NOTE — Therapy (Signed)
OUTPATIENT OCCUPATIONAL THERAPY [facility]  Patient Name: Zachary C Spielmann Jr. MRN: [id number] DOB:04/15/1941, 81 y.o., male Today's Date: 04/30/2022  PCP: Dr. Ravinsankar Avva REFERRING PROVIDER: Dr. Rebecca Tat   OT End of Session - 04/30/22 1018     Visit Number 11    Number of Visits 17    Date for OT Re-Evaluation 06/08/22    Authorization Type [facility] Auth  6/30-10/28/23    Authorization - Visit Number 11    Authorization - Number of Visits 20    Progress Note Due on Visit 20    OT Start Time 0852    OT Stop Time 0933    OT Time Calculation (min) 41 min    Activity Tolerance Patient tolerated treatment well    Behavior During Therapy WFL for tasks assessed/performed                       Past Medical History:  Diagnosis Date   Arthritis    hands   BPH (benign prostatic hyperplasia)    Elevated PSA    Foley catheter in place    History of adenomatous polyp of colon    2014  tubular adenoma   History of malignant melanoma    s/p  excision left forearm and left axill lymph node bx 09-10-2004   Hyperlipidemia    Hypertension    Peripheral vascular disease (HCC)    Urinary retention    Past Surgical History:  Procedure Laterality Date   ANAL FISSURE REPAIR  1984   COLONOSCOPY N/A 05/02/2013   Procedure: COLONOSCOPY;  Surgeon: Martin K Johnson, MD;  Location: [facility];  Service: Endoscopy;  Laterality: N/A;   INSERTION OF SUPRAPUBIC CATHETER N/A 09/30/2015   Procedure: INSERTION OF SUPRAPUBIC CATHETER;  Surgeon: Mark Ottelin, MD;  Location: [facility];  Service: Urology;  Laterality: N/A;   SHOULDER ARTHROSCOPY W/ SUBACROMIAL DECOMPRESSION AND DISTAL CLAVICLE EXCISION Left 05-20-2010   w/  Debridement , Bursectomy, Acromioplasty,  Capsulectomy   TRANSURETHRAL RESECTION OF PROSTATE N/A 09/30/2015   Procedure: TURP (TRANSURETHRAL RESECTION OF PROSTATE WITH GYRUS;  Surgeon: Mark Ottelin, MD;  Location: [facility];   Service: Urology;  Laterality: N/A;   WIDE EXCISION MALIGNANT MELANOMA LEFT FOREARM/  LEFT AXILLA LYMPH NODE BX  09-10-2004   Patient Active Problem List   Diagnosis Date Noted   Parkinson's disease (HCC) 04/13/2017   BPH (benign prostatic hypertrophy) with urinary retention 09/30/2015    ONSET DATE: 12/09/21 PD Screen date  REFERRING DIAG: G20 (ICD-10-CM) - Parkinson's disease (HCC)   THERAPY DIAG:  Other symptoms and signs involving the nervous system  Stiffness of right shoulder, not elsewhere classified  Other symptoms and signs involving the musculoskeletal system  Other lack of coordination  Abnormal posture  Stiffness of left shoulder, not elsewhere classified  Tremor  Unsteadiness on feet  Rationale for Evaluation and Treatment Rehabilitation  SUBJECTIVE:   SUBJECTIVE STATEMENT: "That's hard--pegs"  "the buttons are better"  Pt accompanied by: self  PERTINENT HISTORY: arthritis, hx of malignant melanoma, HDL, HTN, Peripheral vascular disease, hx of L shoudler arthroscopy 2011, bilateral cataract surgery 10/2019, hx of limited L wrist flex  PRECAUTIONS: Fall  PAIN:  Are you having pain? No  Occasional R shoulder soreness after playing golf  PLOF: Independent, Vocation/Vocational requirements: retired real estate, and Leisure: [employer] Moves Class, PD cycling class, golf 2x/wk (min difficulty with putting golf ball on tee with tremors), occasional yoga class ([place])    PATIENT GOALS improve coordination, ADLs   OBJECTIVE:   TODAY'S TREATMENT:   With both hands:  with palm on table, individual finger lifts with abduction/adduction, then isolated/individual finger lifts for incr MP ext with wt. Bearing through palms over balance domes. Then individual finger flex with palm on table and PWR! Step With min v.c. for incr movement amplitude.  Isolated IP flexion/ext for decr rigidity of hands with min v.c.  PATIENT EDUCATION: Education details: Closed-chain  shoulder flex and diagonals in standing (performed in front of mirror for visual feedback).  Discussed, cued pt to avoid leaning to L when sitting to help with RUE movement and improve posture for distal control and balance. Person educated: Patient Education method: Explanation, Demonstration, Verbal cues, and Handouts Education comprehension: verbalized understanding and returned demonstration   HOME EXERCISE PROGRAM: 03/31/22:  PWR! Hands (review & re-issue); Other Hand HEP for ROM/coordination (finger walking for abduction, individual finger lifts, isolated IP flex); review and updated coordination HEP (see pt instructions--added to perform with BUEs simultaneously)   04/06/22:  Ways to decr risk of future complications related to PD 04/30/22:  Closed-chain shoulder flex and diagonals in standing.  GOALS: Potential Goals reviewed with patient? Yes  SHORT TERM GOALS: Target date: 04/23/2022    Pt will be independent with updated PD-specific HEP.  Goal status: ONGOING  04/06/22  2.  Pt will demo at least 120* R shoulder flexion for overhead reaching. Baseline:  110*  Goal status: MET.  04/06/22:  130*, -5* elbow ext.   3.  Pt will improve L hand coordination for ADLs as shown by improving time on 9-hole peg test by at least 3sec.  Baseline:  36.19sec Goal status: ONGOING.  04/06/22:  45.40sec, 44.82sec.  4.  Pt will verbalize understanding of ways to decr risk of future complications related to PD. Goal status: MET.  04/06/22    LONG TERM GOALS: Target date: 06/02/2022    Pt will verbalize understanding of adaptive strategies to incr ease, safety, and independence with ADLs/IADLs (including eating, opening containers, sit>stand from various surfaces, picking up items from floor, dressing, putting golf ball on tee, flipping eggs).   Goal status: Ongoing.  Reviewed strategies for opening containers, sit>stand, flipping egg, picking up items from the floor, putting golf ball on tee  2.  Pt  will demo at least 120* R shoulder flexion for overhead reaching with at least -15* elbow ext.  Baseline:  110* with -20* elbow ext Goal status: MET.  04/06/22:  130* with -5* elbow ext.  (LUE 130* with elbow ext WNL)  3.  Pt will improve L hand coordination for ADLs as shown by improving time on 9-hole peg test by at least 3sec.  Baseline:  36.19sec Goal status: INITIAL  4.  Pt will improve bilateral hand coordination as shown by fastening/unfastening 3 buttons in 48sec or less. Baseline:   52.56sec Goal status: Met.  22.31sec  5.  Pt will improve functional reaching/coordination as shown by improving score on bilateral box and blocks test by 3 with each UE. Goal status: INITIAL   ASSESSMENT:  CLINICAL IMPRESSION: Pt is progressing with improving L hand coordination and improving posture.  Noted decr trunk hyperext today, but pt tends to lean to the L in sitting.    PERFORMANCE DEFICITS in functional skills including ADLs, IADLs, coordination, tone, ROM, flexibility, FMC, GMC, mobility, balance, and UE functional use, and psychosocial skills including habits.   IMPAIRMENTS are limiting patient from ADLs, IADLs, and leisure.  COMORBIDITIES may have co-morbidities  that affects occupational performance. Patient will benefit from skilled OT to address above impairments and improve overall function.  MODIFICATION OR ASSISTANCE TO COMPLETE EVALUATION: Min-Moderate modification of tasks or assist with assess necessary to complete an evaluation.  OT OCCUPATIONAL PROFILE AND HISTORY: Detailed assessment: Review of records and additional review of physical, cognitive, psychosocial history related to current functional performance.  CLINICAL DECISION MAKING: Moderate - several treatment options, min-mod task modification necessary  REHAB POTENTIAL: Good  EVALUATION COMPLEXITY: Moderate    PLAN: OT FREQUENCY: 2x/week  OT DURATION:  x8 weeks or 16 sessions + eval over 12 weeks (due to  scheduling)  PLANNED INTERVENTIONS: self care/ADL training, therapeutic exercise, therapeutic activity, neuromuscular re-education, manual therapy, passive range of motion, balance training, functional mobility training, aquatic therapy, electrical stimulation, ultrasound, fluidotherapy, moist heat, cryotherapy, patient/family education, and DME and/or AE instructions  RECOMMENDED OTHER SERVICES: none at this time  CONSULTED AND AGREED WITH PLAN OF CARE: Patient  PLAN FOR NEXT SESSION:    continue to work on posture (avoid compensation), in-hand manipulation without compensation/coordination, ADL strategies check remaining goals next week, ? D/c and schedule follow-up   FREEMAN,ANGELA, OTR/L 04/30/2022, 10:19 AM 

## 2022-04-30 NOTE — Patient Instructions (Signed)
    SHOULDER: Flexion Bilateral    Raise arms overhead at same speed. Keep elbows straight. 10 reps per set, 1 sets per day.  Maintain object in midline.  ELBOWS STRAIGHTS, HEAD/CHEST UP, DON'T LEAN BACK AT HIPS   V Chop    STAND HOLDING ball forward. MOVE ARMS V shape (DIAGONALS TO EACH SIDE): up and out, then down, then up and out on other side.  ELBOWS STRAIGHT, TWIST AND LOOK AT BALL. Do 10 repetitions.

## 2022-05-04 NOTE — Therapy (Signed)
OUTPATIENT OCCUPATIONAL THERAPY [facility]  Patient Name: Zachary Wang. MRN: [id number] DOB:07/12/1941, 81 y.o., male Today's Date: 05/05/2022  PCP: Dr. Betsey Amen REFERRING PROVIDER: Dr. Wells Guiles Tat   OT End of Session - 05/05/22 0856     Visit Number 12    Number of Visits 17    Date for OT Re-Evaluation 06/08/22    Authorization Type [place] Auth  6/30-10/28/23    Authorization - Visit Number 12    Authorization - Number of Visits 20    Progress Note Due on Visit 20    OT Start Time [phone]    OT Stop Time 0930    OT Time Calculation (min) 38 min    Activity Tolerance Patient tolerated treatment well    Behavior During Therapy [facility] for tasks assessed/performed                        Past Medical History:  Diagnosis Date   Arthritis    hands   BPH (benign prostatic hyperplasia)    Elevated PSA    Foley catheter in place    History of adenomatous polyp of colon    2014  tubular adenoma   History of malignant melanoma    s/p  excision left forearm and left axill lymph node bx 09-10-2004   Hyperlipidemia    Hypertension    Peripheral vascular disease [facility])    Urinary retention    Past Surgical History:  Procedure Laterality Date   ANAL FISSURE REPAIR  1984   COLONOSCOPY N/A 05/02/2013   Procedure: COLONOSCOPY;  Surgeon: Garlan Fair, MD;  Location: [facility];  Service: Endoscopy;  Laterality: N/A;   INSERTION OF SUPRAPUBIC CATHETER N/A 09/30/2015   Procedure: INSERTION OF SUPRAPUBIC CATHETER;  Surgeon: Kathie Rhodes, MD;  Location: [facility];  Service: Urology;  Laterality: N/A;   SHOULDER ARTHROSCOPY W/ SUBACROMIAL DECOMPRESSION AND DISTAL CLAVICLE EXCISION Left 05-20-2010   w/  Debridement , Bursectomy, Acromioplasty,  Capsulectomy   TRANSURETHRAL RESECTION OF PROSTATE N/A 09/30/2015   Procedure: TURP (TRANSURETHRAL RESECTION OF PROSTATE WITH GYRUS;  Surgeon: Kathie Rhodes, MD;  Location: [place];   Service: Urology;  Laterality: N/A;   WIDE EXCISION MALIGNANT MELANOMA LEFT FOREARM/  LEFT AXILLA LYMPH NODE BX  09-10-2004   Patient Active Problem List   Diagnosis Date Noted   Parkinson's disease ([place]) 04/13/2017   BPH (benign prostatic hypertrophy) with urinary retention 09/30/2015    ONSET DATE: 12/09/21 PD Screen date  REFERRING DIAG: G20 (ICD-10-CM) - Parkinson's disease ([place])   THERAPY DIAG:  Other symptoms and signs involving the nervous system  Stiffness of right shoulder, not elsewhere classified  Other symptoms and signs involving the musculoskeletal system  Other lack of coordination  Abnormal posture  Stiffness of left shoulder, not elsewhere classified  Tremor  Unsteadiness on feet  Rationale for Evaluation and Treatment Rehabilitation  SUBJECTIVE:   SUBJECTIVE STATEMENT: "I did these (ball) exercises this morning and my arms were tight"   Pt accompanied by: self  PERTINENT HISTORY: arthritis, hx of malignant melanoma, HDL, HTN, Peripheral vascular disease, hx of L shoudler arthroscopy 2011, bilateral cataract surgery 10/2019, hx of limited L wrist flex  PRECAUTIONS: Fall  PAIN:  Are you having pain? 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oves Class, PD cycling class, golf 2x/wk (min difficulty with putting golf ball on tee  with tremors), occasional yoga class [facility])  PATIENT GOALS improve coordination, ADLs   OBJECTIVE:   TODAY'S TREATMENT:   With both hands:  with palm on table, individual finger lifts with abduction/adduction, then isolated/individual finger lifts for incr MP ext with push through palms . Then individual finger flex with palm on table.  PWR! Step With min v.c. for incr movement amplitude.  Isolated IP flexion/ext for decr rigidity of hands with min v.c.  Standing, closed-chain shoulder flex (floor>overhead) and  diagonals to each side with trunk rotation with min cueing for large amplitude movements.    Manipulating pieces of [place] Manipulation Test with BUEs simultaneously with PWR! Hands with min cueing for deliberate movements and avoid proximal compensation.    Manipulating coins in L hand (pick up 5 and translate to place on table 1 at a time with heads up) with min cueing.  Began checking remaining goals and discussed progress and recommended follow-up.--see below for progress.  Reviewed/practiced simulated flipping egg with pan/spatula with min cueing for hand positioning.  Pt able to return demo and reports success at home.  Pt also reports incr ease with opening containers.     PATIENT EDUCATION: Education details: Reviewed posture/midline alignment checks at home  Person educated: Patient Education method: Explanation, Demonstration, and Verbal cues Education comprehension: verbalized understanding and returned demonstration   HOME EXERCISE PROGRAM: 03/31/22:  PWR! Hands (review & re-issue); Other Hand HEP for ROM/coordination (finger walking for abduction, individual finger lifts, isolated IP flex); review and updated coordination HEP (see pt instructions--added to perform with BUEs simultaneously)   04/06/22:  Ways to decr risk of future complications related to PD 04/30/22:  Closed-chain shoulder flex and diagonals in standing.  GOALS: Potential Goals reviewed with patient? Yes  SHORT TERM GOALS: Target date: 04/23/2022    Pt will be independent with updated PD-specific HEP.  Goal status:  Met. 05/05/22  2.  Pt will demo at least 120* R shoulder flexion for overhead reaching. Baseline:  110*  Goal status: MET.  04/06/22:  130*, -5* elbow ext.   3.  Pt will improve L hand coordination for ADLs as shown by improving time on 9-hole peg test by at least 3sec.  Baseline:  36.19sec Goal status: ONGOING.  04/06/22:  45.40sec, 44.82sec.  05/05/22:  43.25sec  4.  Pt will verbalize  understanding of ways to decr risk of future complications related to PD. Goal status: MET.  04/06/22    LONG TERM GOALS: Target date: 06/02/2022    Pt will verbalize understanding of adaptive strategies to incr ease, safety, and independence with ADLs/IADLs (including eating, opening containers, sit>stand from various surfaces, picking up items from floor, dressing, putting golf ball on tee, flipping eggs).   Goal status: Met.  Reviewed strategies for opening containers, sit>stand, flipping egg, picking up items from the floor, putting golf ball on tee  2.  Pt will demo at least 120* R shoulder flexion for overhead reaching with at least -15* elbow ext.  Baseline:  110* with -20* elbow ext Goal status: MET.  04/06/22:  130* with -5* elbow ext.  (LUE 130* with elbow ext WNL)  3.  Pt will improve L hand coordination for ADLs as shown by improving time on 9-hole peg test by at least 3sec.  Baseline:  36.19sec Goal status: Not met 43.25sec  4.  Pt will improve bilateral hand coordination as shown by fastening/unfastening 3 buttons in 48sec or less. Baseline:   52.56sec Goal status: Met.  22.31sec  5.  Pt will improve functional reaching/coordination as shown by improving score on bilateral box and blocks test by 3 with each UE. Goal status: Not met  05/05/22:  R-49blocks, L-46 blocks   ASSESSMENT:  CLINICAL IMPRESSION: Pt demo improved in-hand manipulation, but continues to need cueing at times for leaning to the L in sitting.     PERFORMANCE DEFICITS in functional skills including ADLs, IADLs, coordination, tone, ROM, flexibility, FMC, GMC, mobility, balance, and UE functional use, and psychosocial skills including habits.   IMPAIRMENTS are limiting patient from ADLs, IADLs, and leisure.   COMORBIDITIES may have co-morbidities  that affects occupational performance. Patient will benefit from skilled OT to address above impairments and improve overall function.  MODIFICATION OR  ASSISTANCE TO COMPLETE EVALUATION: Min-Moderate modification of tasks or assist with assess necessary to complete an evaluation.  OT OCCUPATIONAL PROFILE AND HISTORY: Detailed assessment: Review of records and additional review of physical, cognitive, psychosocial history related to current functional performance.  CLINICAL DECISION MAKING: Moderate - several treatment options, min-mod task modification necessary  REHAB POTENTIAL: Good  EVALUATION COMPLEXITY: Moderate    PLAN: OT FREQUENCY: 2x/week  OT DURATION:  x8 weeks or 16 sessions + eval over 12 weeks (due to scheduling)  PLANNED INTERVENTIONS: self care/ADL training, therapeutic exercise, therapeutic activity, neuromuscular re-education, manual therapy, passive range of motion, balance training, functional mobility training, aquatic therapy, electrical stimulation, ultrasound, fluidotherapy, moist heat, cryotherapy, patient/family education, and DME and/or AE instructions  RECOMMENDED OTHER SERVICES: none at this time  CONSULTED AND AGREED WITH PLAN OF CARE: Patient  PLAN FOR NEXT SESSION:    d/c next visit, schedule follow-up screens in approx 6-7 months with OT, PT, and ST     Royanne Warshaw, OTR/L 05/05/2022, 8:57 AM

## 2022-05-05 ENCOUNTER — Ambulatory Visit: Payer: No Typology Code available for payment source | Admitting: Occupational Therapy

## 2022-05-05 ENCOUNTER — Encounter: Payer: Self-pay | Admitting: Occupational Therapy

## 2022-05-05 DIAGNOSIS — M25611 Stiffness of right shoulder, not elsewhere classified: Secondary | ICD-10-CM

## 2022-05-05 DIAGNOSIS — M25612 Stiffness of left shoulder, not elsewhere classified: Secondary | ICD-10-CM

## 2022-05-05 DIAGNOSIS — R251 Tremor, unspecified: Secondary | ICD-10-CM

## 2022-05-05 DIAGNOSIS — R29818 Other symptoms and signs involving the nervous system: Secondary | ICD-10-CM | POA: Diagnosis not present

## 2022-05-05 DIAGNOSIS — R293 Abnormal posture: Secondary | ICD-10-CM

## 2022-05-05 DIAGNOSIS — R29898 Other symptoms and signs involving the musculoskeletal system: Secondary | ICD-10-CM

## 2022-05-05 DIAGNOSIS — R278 Other lack of coordination: Secondary | ICD-10-CM

## 2022-05-05 DIAGNOSIS — R2681 Unsteadiness on feet: Secondary | ICD-10-CM

## 2022-05-06 NOTE — Therapy (Signed)
OUTPATIENT OCCUPATIONAL THERAPY PARKINSON'S TREATMENT  Patient Name: Zachary Wang. MRN: [id number] DOB:12/31/1940, 81 y.o., male Today's Date: 05/07/2022  PCP: Dr. Betsey Amen REFERRING PROVIDER: Dr. Wells Guiles Tat   OT End of Session - 05/07/22 0850     Visit Number 13    Number of Visits 17    Date for OT Re-Evaluation 06/08/22    Authorization Type [place] Auth  6/30-10/28/23    Authorization - Visit Number 13    Authorization - Number of Visits 20    Progress Note Due on Visit 61    OT Start Time 0849    OT Stop Time 0934    OT Time Calculation (min) 45 min    Activity Tolerance Patient tolerated treatment well    Behavior During Therapy [facility] for tasks assessed/performed                         Past Medical History:  Diagnosis Date   Arthritis    hands   BPH (benign prostatic hyperplasia)    Elevated PSA    Foley catheter in place    History of adenomatous polyp of colon    2014  tubular adenoma   History of malignant melanoma    s/p  excision left forearm and left axill lymph node bx 09-10-2004   Hyperlipidemia    Hypertension    Peripheral vascular disease [facility])    Urinary retention    Past Surgical History:  Procedure Laterality Date   [place]   COLONOSCOPY N/A 05/02/2013   Procedure: COLONOSCOPY;  Surgeon: Garlan Fair, MD;  Location: [facility];  Service: Endoscopy;  Laterality: N/A;   INSERTION OF SUPRAPUBIC CATHETER N/A 09/30/2015   Procedure: INSERTION OF SUPRAPUBIC CATHETER;  Surgeon: Kathie Rhodes, MD;  Location: [facility];  Service: Urology;  Laterality: N/A;   SHOULDER ARTHROSCOPY W/ SUBACROMIAL DECOMPRESSION AND DISTAL CLAVICLE EXCISION Left 05-20-2010   w/  Debridement , Bursectomy, Acromioplasty,  Capsulectomy   TRANSURETHRAL RESECTION OF PROSTATE N/A 09/30/2015   Procedure: TURP (TRANSURETHRAL RESECTION OF PROSTATE WITH GYRUS;  Surgeon: Kathie Rhodes, MD;  Location: [place];   Service: Urology;  Laterality: N/A;   WIDE EXCISION MALIGNANT MELANOMA LEFT FOREARM/  LEFT AXILLA LYMPH NODE BX  09-10-2004   Patient Active Problem List   Diagnosis Date Noted   Parkinson's disease ([place]) 04/13/2017   BPH (benign prostatic hypertrophy) with urinary retention 09/30/2015    ONSET DATE: 12/09/21 PD Screen date  REFERRING DIAG: G20 (ICD-10-CM) - Parkinson's disease ([place])   THERAPY DIAG:  Other symptoms and signs involving the nervous system  Stiffness of right shoulder, not elsewhere classified  Other symptoms and signs involving the musculoskeletal system  Other lack of coordination  Abnormal posture  Stiffness of left shoulder, not elsewhere classified  Tremor  Unsteadiness on feet  Rationale for Evaluation and Treatment Rehabilitation  SUBJECTIVE:   SUBJECTIVE STATEMENT: Pt reports that he is doing exercises at home.    Pt accompanied by: self  PERTINENT HISTORY: arthritis, hx of malignant melanoma, HDL, HTN, Peripheral vascular disease, hx of L shoudler arthroscopy 2011, bilateral cataract surgery 10/2019, hx of limited L wrist flex  PRECAUTIONS: Fall  PAIN:  Are you having pain? 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 Moves Class, PD cycling class, golf 2x/wk (min difficulty with putting golf ball on tee with  tremors), occasional yoga class [facility])  PATIENT GOALS improve coordination, ADLs   OBJECTIVE:   TODAY'S TREATMENT:    Sitting, flipping cards and dealing cards with thumb with both hands simultaneously for bilateral hand coordination and timing, with mirror/min cueing for posture and midline alignment and min cueing for large amplitude movements.  Manipulating pieces of [place] Manipulation Test with BUEs simultaneously with PWR! Hands with min cueing for deliberate movements and avoid proximal compensation. Statistician for  feedback on midline alignment.   Functional reaching with each UE to place small pegs in vertical pegboard to copy design (with good accuracy) for incr coordination, and set-up for large amplitude end-range shoulder flex.    Pt instructed to avoid leaning on L side, incorporate LUE in functional reaching more for incr L side (UE and trunk activation) and head/eye movements with reaching to decr risk for future complications.    Reviewed progress and recommendation for follow-up OT, PT, ST screens.  Pt verbalized understanding/agreement.     PATIENT EDUCATION: Education details: Common Vision Symptoms Associated with PD to discuss with neurologist and eye doctor and importance of eye movements with HEP.  Discussed pros/cons of DBS as able and resources per pt questions regarding functional changes. Person educated: Patient Education method: Explanation Education comprehension: verbalized understanding   HOME EXERCISE PROGRAM: 03/31/22:  PWR! Hands (review & re-issue); Other Hand HEP for ROM/coordination (finger walking for abduction, individual finger lifts, isolated IP flex); review and updated coordination HEP (see pt instructions--added to perform with BUEs simultaneously)   04/06/22:  Ways to decr risk of future complications related to PD 04/30/22:  Closed-chain shoulder flex and diagonals in standing. 05/07/22:  Common Vision Symptoms Associated with PD  GOALS: Potential Goals reviewed with patient? Yes  SHORT TERM GOALS: Target date: 04/23/2022    Pt will be independent with updated PD-specific HEP.  Goal status:  Met. 05/05/22  2.  Pt will demo at least 120* R shoulder flexion for overhead reaching. Baseline:  110*  Goal status: MET.  04/06/22:  130*, -5* elbow ext.   3.  Pt will improve L hand coordination for ADLs as shown by improving time on 9-hole peg test by at least 3sec.  Baseline:  36.19sec Goal status: NOT MET.  04/06/22:  45.40sec, 44.82sec.  05/05/22:  43.25sec  4.   Pt will verbalize understanding of ways to decr risk of future complications related to PD. Goal status: MET.  04/06/22    LONG TERM GOALS: Target date: 06/02/2022    Pt will verbalize understanding of adaptive strategies to incr ease, safety, and independence with ADLs/IADLs (including eating, opening containers, sit>stand from various surfaces, picking up items from floor, dressing, putting golf ball on tee, flipping eggs).   Goal status: Met.  Reviewed strategies for opening containers, sit>stand, flipping egg, picking up items from the floor, putting golf ball on tee  2.  Pt will demo at least 120* R shoulder flexion for overhead reaching with at least -15* elbow ext.  Baseline:  110* with -20* elbow ext Goal status: MET.  04/06/22:  130* with -5* elbow ext.  (LUE 130* with elbow ext WNL)  3.  Pt will improve L hand coordination for ADLs as shown by improving time on 9-hole peg test by at least 3sec.  Baseline:  36.19sec Goal status: Not met 43.25sec  4.  Pt will improve bilateral hand coordination as shown by fastening/unfastening 3 buttons in 48sec or less. Baseline:   52.56sec Goal status: Met.  22.31sec  5.  Pt will improve functional reaching/coordination as shown by improving score on bilateral box and blocks test by 3 with each UE. Goal status: Not met  05/05/22:  R-49blocks, L-46 blocks   ASSESSMENT:  CLINICAL IMPRESSION: Pt has made good overall progress and is aware of recommendations/updated HEP to continue at home.  PERFORMANCE DEFICITS in functional skills including ADLs, IADLs, coordination, tone, ROM, flexibility, FMC, GMC, mobility, balance, and UE functional use, and psychosocial skills including habits.   IMPAIRMENTS are limiting patient from ADLs, IADLs, and leisure.   COMORBIDITIES may have co-morbidities  that affects occupational performance. Patient will benefit from skilled OT to address above impairments and improve overall function.  MODIFICATION OR  ASSISTANCE TO COMPLETE EVALUATION: Min-Moderate modification of tasks or assist with assess necessary to complete an evaluation.  OT OCCUPATIONAL PROFILE AND HISTORY: Detailed assessment: Review of records and additional review of physical, cognitive, psychosocial history related to current functional performance.  CLINICAL DECISION MAKING: Moderate - several treatment options, min-mod task modification necessary  REHAB POTENTIAL: Good  EVALUATION COMPLEXITY: Moderate    PLAN: OT FREQUENCY: 2x/week  OT DURATION:  x8 weeks or 16 sessions + eval over 12 weeks (due to scheduling)  PLANNED INTERVENTIONS: self care/ADL training, therapeutic exercise, therapeutic activity, neuromuscular re-education, manual therapy, passive range of motion, balance training, functional mobility training, aquatic therapy, electrical stimulation, ultrasound, fluidotherapy, moist heat, cryotherapy, patient/family education, and DME and/or AE instructions  RECOMMENDED OTHER SERVICES: none at this time  CONSULTED AND AGREED WITH PLAN OF CARE: Patient  PLAN FOR NEXT SESSION:    d/c OT, schedule follow-up screens in approx 6-7 months with OT, PT, and ST    OCCUPATIONAL THERAPY DISCHARGE SUMMARY  Visits from Start of Care: 13  Current functional level related to goals / functional outcomes: See above   Remaining deficits: Bradykinesia, rigidity, decr coordination, abnormal posture, decr balance/functional mobility, Tremors   Education / Equipment: Pt was instructed in the following:  PD-specific HEP, adaptive strategies for ADLs/IADLs, ways to prevent future complications.  Pt verbalized understanding of all education provided.   Plan: Patient agrees to discharge.  Patient goals were partially met. Patient is being discharged due to being pleased with the current functional level.  Pt would benefit from occupational therapy screens in approx 6-8 months to assess for need for further therapy/functional  changes due to progressive nature of diagnosis.    Noga Fogg, OTR/L 05/07/2022, 10:26 AM

## 2022-05-07 ENCOUNTER — Ambulatory Visit: Payer: No Typology Code available for payment source | Admitting: Occupational Therapy

## 2022-05-07 ENCOUNTER — Encounter: Payer: Self-pay | Admitting: Occupational Therapy

## 2022-05-07 DIAGNOSIS — R293 Abnormal posture: Secondary | ICD-10-CM

## 2022-05-07 DIAGNOSIS — R2681 Unsteadiness on feet: Secondary | ICD-10-CM

## 2022-05-07 DIAGNOSIS — R251 Tremor, unspecified: Secondary | ICD-10-CM

## 2022-05-07 DIAGNOSIS — R29818 Other symptoms and signs involving the nervous system: Secondary | ICD-10-CM | POA: Diagnosis not present

## 2022-05-07 DIAGNOSIS — R29898 Other symptoms and signs involving the musculoskeletal system: Secondary | ICD-10-CM

## 2022-05-07 DIAGNOSIS — R278 Other lack of coordination: Secondary | ICD-10-CM

## 2022-05-07 DIAGNOSIS — M25612 Stiffness of left shoulder, not elsewhere classified: Secondary | ICD-10-CM

## 2022-05-07 DIAGNOSIS — M25611 Stiffness of right shoulder, not elsewhere classified: Secondary | ICD-10-CM

## 2022-06-23 NOTE — Progress Notes (Unsigned)
Assessment/Plan:   1.  Parkinsons Disease  -Continue carbidopa/levodopa 25/100, 2/2/1  -Continue carbidopa/levodopa 50/200 at bedtime for cramping of feet/legs  -Continue pramipexole ER, 1.5 mg daily.  No compulsive behaviors.   -Genetic testing completed.  Heterozygous for GBA mutation.  This could provide a risk factor for inherited Parkinson's, but likely would also require an environmental component.  This is different from homozygous carrier, which would have a much greater risk factor (10-20 times).    2.  History of malignant melanoma  -Continues to see dermatology yearly.  3.  Sialorrhea  -better with myobloc.  Last injection September 1   Subjective:   Zachary Wang. was seen today in follow up for Parkinsons disease.  My previous records were reviewed prior to todays visit as well as outside records available to me.  Patient doing fairly well from a Parkinson's standpoint.  He is currently in occupational therapy.  Notes are reviewed.  Drooling well controlled with Botox.  He has had no compulsive behaviors or sleep attacks.  Continues on levodopa and pramipexole.  Current prescribed movement disorder medications: Carbidopa/levodopa 25/100, 2/2/1 Pramipexole ER, 1.5 mg daily Carbidopa/levodopa 50/200 CR at bedtime   ALLERGIES:  No Known Allergies  CURRENT MEDICATIONS:  Outpatient Encounter Medications as of 06/25/2022  Medication Sig   amLODipine (NORVASC) 5 MG tablet Take 5 mg by mouth every morning.   atorvastatin (LIPITOR) 20 MG tablet Take 20 mg by mouth every evening.    carbidopa-levodopa (SINEMET CR) 50-200 MG tablet Take 1 tablet by mouth at bedtime.   carbidopa-levodopa (SINEMET IR) 25-100 MG tablet 2 at 7am, 2 at 11am, 1 at 4pm   hydrochlorothiazide (HYDRODIURIL) 25 MG tablet Take 25 mg by mouth every morning.    losartan (COZAAR) 100 MG tablet Take 50 mg by mouth 2 (two) times daily.   Pramipexole Dihydrochloride 1.5 MG TB24 Take 1 tablet (1.5 mg  total) by mouth at bedtime for 90 doses.   No facility-administered encounter medications on file as of 06/25/2022.    Objective:   PHYSICAL EXAMINATION:    VITALS:   There were no vitals filed for this visit.     GEN:  The patient appears stated age and is in NAD. HEENT:  Normocephalic, atraumatic.  The mucous membranes are moist. The superficial temporal arteries are without ropiness or tenderness. CV:  RRR Lungs:  CTAB Neck/HEME:  There are no carotid bruits bilaterally.  Neurological examination:  Orientation: The patient is alert and oriented x3. Cranial nerves: There is good facial symmetry with facial hypomimia. The speech is fluent and clear. Soft palate rises symmetrically and there is no tongue deviation. Hearing is intact to conversational tone. Sensation: Sensation is intact to light touch throughout Motor: Strength is at least antigravity x4.  Movement examination: Tone: There is mild increased tone in the UE bilaterally, L>R Abnormal movements: none Coordination:  There is min decremation, with any form of RAMS, including alternating supination and pronation of the forearm, hand opening and closing, finger taps, heel taps and toe taps on the L Gait and Station: The patient has min difficulty arising out of a deep-seated chair without the use of the hands. The patient's stride length is slightly decreased but with good arm swing.  This is stable   Total time spent on today's visit was *** minutes, including both face-to-face time and nonface-to-face time.  Time included that spent on review of records (prior notes available to me/labs/imaging if pertinent), discussing treatment  and goals, answering patient's questions and coordinating care.   Cc:  Prince Solian, MD

## 2022-06-25 ENCOUNTER — Encounter: Payer: Self-pay | Admitting: Neurology

## 2022-06-25 ENCOUNTER — Ambulatory Visit (INDEPENDENT_AMBULATORY_CARE_PROVIDER_SITE_OTHER): Payer: No Typology Code available for payment source | Admitting: Neurology

## 2022-06-25 VITALS — BP 121/67 | HR 65 | Ht 74.0 in | Wt 221.0 lb

## 2022-06-25 DIAGNOSIS — K117 Disturbances of salivary secretion: Secondary | ICD-10-CM | POA: Diagnosis not present

## 2022-06-25 DIAGNOSIS — G20A1 Parkinson's disease without dyskinesia, without mention of fluctuations: Secondary | ICD-10-CM | POA: Diagnosis not present

## 2022-06-25 MED ORDER — GLYCOPYRROLATE 1 MG PO TABS: 1.0000 mg | ORAL_TABLET | Freq: Two times a day (BID) | ORAL | 1 refills | Status: DC

## 2022-06-25 MED ORDER — CARBIDOPA-LEVODOPA ER 50-200 MG PO TBCR: 1.0000 | EXTENDED_RELEASE_TABLET | Freq: Every day | ORAL | 3 refills | Status: DC

## 2022-06-25 MED ORDER — PRAMIPEXOLE DIHYDROCHLORIDE ER 1.5 MG PO TB24: 1.0000 | ORAL_TABLET | Freq: Every day | ORAL | 3 refills | Status: DC

## 2022-06-25 MED ORDER — CARBIDOPA-LEVODOPA 25-100 MG PO TABS: ORAL_TABLET | ORAL | 3 refills | Status: DC

## 2022-06-25 NOTE — Patient Instructions (Addendum)
Take robinul (glycopyrrolate), 1 in the AM and 1 in the middle of the day increase water intake to avoid cramping.  Don't buy coke!!  Local and Online Resources for Power over Parkinson's Group  November 2023    LOCAL [place] PARKINSON'S GROUPS   Power over Parkinson's Group:    Power Over Parkinson's Patient Education Group will be Wednesday, November 8th-*Hybrid meting*- in person at [facility] location and via [facility], 2:00-3:00 pm.   Starting in November, Power over [employer] and Care Partner Groups will meet together, with plans for separate break out session for caregivers (*this will be evolving over the next few months) Upcoming Power over Parkinson's Meetings/Care Partner Support:  2nd Wednesdays of the month at 2 pm:   November 8th, December 13th  [place] at [email or web address] if interested in participating in this group    [place] and Fall Prevention Workshop.  Thursday, November 9th 1-2pm, Studio [employer], [employer].  Register with Vonna Kotyk at [email or web address] or [phone] New PWR! Moves [employer] Instructor-Led Classes offering at [employer]!  TUESDAYS and Wednesdays 1-2 pm.   Contact Vonna Kotyk at  [email or web address]  or [phone] (Tuesday classes are modified for chair and standing only) Dance for Parkinson 's classes will be on Tuesdays 9:30am-10:30am starting October 3-December 12 with a break the week of November 21st. Located in the [employer] , in the first floor of the [employer] ([place].) To register:  [email or web address] or [phone]  Drumming for Parkinson's will be held on 2nd and 4th Mondays at 11:00 am.   Located at the [place] ([place]  [place] at [email or web address] or [phone]  Through support from the [place] for Parkinson's classes are free for both patients and caregivers.    [employer] Parkinson's Tai Chi Class, Mondays at 11 am.  Call [phone] for details Parkinson's Holiday Party.  Wednesday, December 6th, 4:00-5:00 pm.  Operating [employer] and Fitness.  RSVP to Garnetta Buddy at [phone] or [email or web address]   [place]:  [email or web address]  PD Health at Home continues:  Mindfulness Mondays, Wellness Wednesdays, Fitness Fridays   Upcoming Education:   Why Should you Participate in Parkinson's Research?  Wednesday, Nov. 29th,  1-2 pm  Expert Briefing:    Hallucinations and Delusions in Parkinson's.  Wednesday, Nov. 8th, 1-2 pm  Register for expert briefings (webinars) at [email or web address]  Please check out their website to sign up for emails and see their full online offerings      [place]:  [email or web address]   Third Thursday Webinars:  On the third Thursday of every month at 12 p.m. ET, join our free live webinars to learn about various aspects of living with Parkinson's disease and our work to speed medical breakthroughs.  Upcoming Webinar:  A Year Like No Other in Parkinson's Research:  2023 in Review.  Thursday, November 16th 12 noon. Check out additional information on their website to see their full online offerings    [facility]:  [email or web address]  Upcoming Webinar:   Stay tuned  Webinar Series:  Living with Parkinson's Meetup.   Third Thursdays each month, 3 pm  Care Partner Monthly Meetup.  With Robin Searing Phinney.  First Tuesday of each month, 2 pm  Check out additional information to Live Well Today on their website  Parkinson and Movement Disorders (PMD) Alliance:  www.pmdalliance.org  [employer]:  Online Education Events  Sign up for emails, which are sent weekly to  give you updates on programming and online offerings    Parkinson's Association of [place]:  www.parkinsonassociation.org  Information on online support groups, education events, and online exercises including Yoga, Parkinson's exercises and more-LOTS of information on links to PD resources and online events  Virtual Support Group through Parkinson's Association of [place]; next one is scheduled for Wednesday, November 1st  at 2 pm.  (These are typically scheduled for the 1st Wednesday of the month at 2 pm).  Visit website for details.   MOVEMENT AND EXERCISE OPPORTUNITIES  PWR! Moves Classes at [place] [facility].  Wednesdays 10 and 11 am.   Contact Amy Marriott, PT [email or web address] if interested.  NEW PWR! Moves Class offerings at [employer].  *TUESDAYS* and Wednesdays 1-2 pm.  Contact Christy Weaver at  [email or web address]    Parkinson's Wellness Recovery (PWR! Moves)  www.[email or web address]  Info on the PWR! Virtual Experience:  You will have access to our expertise?through self-assessment, guided plans that start with the PD-specific fundamentals, educational content, tips, Q&A with an expert, and a growing library of PD-specific pre-recorded and live exercise classes of varying types and intensity - both physical and cognitive! If that is not enough, we offer 1:1 wellness consultations (in-person or virtual) to personalize your PWR! Virtual Experience.   Parkinson Foundation Fitness Fridays:   As part of the PD Health @ Home program, this free video series focuses each week on one aspect of fitness designed to support people living with Parkinson's.? These weekly videos highlight the Parkinson Foundation fitness guidelines for people with Parkinson's disease.  www.parkinson.org/resources-support/online-education/pdhealth#ff   Dance for PD website is offering free, live-stream classes throughout the week, as well as links to digital library of classes:   [email or web address]/  Virtual dance and Pilates for Parkinson's classes: Click on the Community Tab> Parkinson's Movement Initiative Tab.  To register for classes and for more information, visit www.americandancefestival.org and click the "community" tab.   [employer] Parkinson's Cycling Classes   [employer]:  Thursdays @ Noon-Live classes at [employer] (Contact Margaret Hazen at [email or web address]?or [phone])  [facility] [employer]: Virtual Classes Mondays and Thursdays /Live classes Tuesday, Wednesday and Thursday (contact Marlee at [email or web address] ?or [phone])  [place] Steady Boxing  Varied levels of classes are offered Tuesdays and Thursdays at [employer].   Stretching with Maria weekly class is also offered for people with Parkinson's  To observe a class or for more information, call [phone] or email Hillary Savage at [email or web address]   ADDITIONAL SUPPORT AND RESOURCES  [employer]:Online Caregiver Education Opportunities:  www.well-springsolutions.org/caregiver-education/caregiver-support-group.  You may also contact Jodi Kolada at [email or web address] or [phone].     [employer] Navigator:  Just1Navigator program, a?free service to help individuals and families through the journey of determining care for older adults.  The "Navigator" is a social worker, Nicole Reynolds, who will speak with a prospective client and/or loved ones to provide an assessment of the situation and a set of recommendations for a personalized care plan -- all free of charge, and whether?[employer] offers the needed service or not. If the need is not a service we provide, we are well-connected with reputable programs in town that we can refer you to.  www.well-springsolutions.org or to speak with the Navigator, call [phone].     

## 2022-07-06 ENCOUNTER — Telehealth: Payer: Self-pay | Admitting: Anesthesiology

## 2022-07-06 MED ORDER — PRAMIPEXOLE DIHYDROCHLORIDE ER 1.5 MG PO TB24: 1.0000 | ORAL_TABLET | Freq: Every day | ORAL | 3 refills | Status: DC

## 2022-07-06 MED ORDER — GLYCOPYRROLATE 1 MG PO TABS: 1.0000 mg | ORAL_TABLET | Freq: Two times a day (BID) | ORAL | 1 refills | Status: DC

## 2022-07-06 MED ORDER — CARBIDOPA-LEVODOPA ER 50-200 MG PO TBCR: 1.0000 | EXTENDED_RELEASE_TABLET | Freq: Every day | ORAL | 3 refills | Status: DC

## 2022-07-06 MED ORDER — CARBIDOPA-LEVODOPA 25-100 MG PO TABS: ORAL_TABLET | ORAL | 3 refills | Status: DC

## 2022-07-06 NOTE — Telephone Encounter (Signed)
Refills sent in for pt to the pharmacy requested pt called an informed that refills have be sent in. Pt asking if we can please fax in a paper copy as well

## 2022-07-06 NOTE — Telephone Encounter (Signed)
Pt called stating that he saw Dr Tat on 06/25/22.. She prescribed and refilled his medications. His pharmacy has not received them yet. He is running low on his Carbidopa-Levodopa. He said he needs all 4 medications sent to   [place], [facility], Fax [phone].

## 2022-07-07 ENCOUNTER — Telehealth: Payer: Self-pay | Admitting: Neurology

## 2022-07-07 NOTE — Telephone Encounter (Signed)
Called pateint and let him know the prescriptions are here for him but I would try to resend the faxes and see if I can get them to the [place] for him

## 2022-07-07 NOTE — Telephone Encounter (Signed)
Patient is wanting to come by pick up the RX for the [place]. he will just take them to the [place] because the [place] is stating that they have not gotten anything from [place].   He needs all four RX  Carbidopa levodopa 50-200 Carbidopa levodopa 25-100 Robinul Pramopexole

## 2022-07-14 NOTE — Telephone Encounter (Signed)
Patient left message on the VM stating that he wanted to speak to [facility] about  medication and the [place]. He did not leave the name of the medication

## 2022-07-15 NOTE — Telephone Encounter (Signed)
Called patient he needs notes faxed over to [place]

## 2022-07-15 NOTE — Telephone Encounter (Signed)
Called and left voicemail message.

## 2022-07-15 NOTE — Telephone Encounter (Signed)
Pt returned call. He will be available all day today.

## 2022-07-17 ENCOUNTER — Ambulatory Visit (INDEPENDENT_AMBULATORY_CARE_PROVIDER_SITE_OTHER): Payer: No Typology Code available for payment source | Admitting: Neurology

## 2022-07-17 DIAGNOSIS — K117 Disturbances of salivary secretion: Secondary | ICD-10-CM | POA: Diagnosis not present

## 2022-07-17 MED ORDER — RIMABOTULINUMTOXINB 5000 UNIT/ML IM SOLN
5000.0000 [IU] | Freq: Once | INTRAMUSCULAR | Status: AC
  Administered 2022-07-17: 5000 [IU] via INTRAMUSCULAR

## 2022-07-17 NOTE — Procedures (Signed)
Botulinum Clinic    History:  Diagnosis: Sialorrhea    Result History  Working well.  Noting wearing off  Consent obtained from: The patient The patient was educated on the botulinum toxin the black blox warning and given a copy of the botox patient medication guide.  The patient understands that this warning states that there have been reported cases of the Botox extending beyond the injection site and creating adverse effects, similar to those of botulism. This included loss of strength, trouble walking, hoarseness, trouble saying words clearly, loss of bladder control, trouble breathing, trouble swallowing, diplopia, blurry vision and ptosis. Most of the distant spread of Botox was happening in patients, primarily children, who received medication for spasticity or for cervical dystonia. The patient expressed understanding and desire to proceed.     Injections  Location Left  Right Units Number of sites  Submandibular gland 250 250 500 1 per side  Parotid 2250 2250 2500 1 per side  TOTAL UNITS:     5000      Type of Toxin: Myobloc type B As ordered and injected IM at today's visit Total Units: 5000  Discarded Units: 0  Needle drawback with each injection was free of blood. Pt tolerated procedure well without complications.   Reinjection is anticipated in 3 months.

## 2022-09-01 ENCOUNTER — Other Ambulatory Visit: Payer: Self-pay | Admitting: Urology

## 2022-09-09 NOTE — Patient Instructions (Signed)
DUE TO COVID-19 ONLY TWO VISITORS  (aged 80 and older)  ARE ALLOWED TO COME WITH YOU AND STAY IN THE WAITING ROOM ONLY DURING PRE OP AND PROCEDURE.   **NO VISITORS ARE ALLOWED IN THE SHORT STAY AREA OR RECOVERY ROOM!!**  IF YOU WILL BE ADMITTED INTO THE HOSPITAL YOU ARE ALLOWED ONLY FOUR SUPPORT PEOPLE DURING VISITATION HOURS ONLY (7 AM -8PM)   The support person(s) must pass our screening, gel in and out, and wear a mask at all times, including in the patient's room. Patients must also wear a mask when staff or their support person are in the room. Visitors GUEST BADGE MUST BE WORN VISIBLY  One adult visitor may remain with you overnight and MUST be in the room by 8 P.M.     Your procedure is scheduled on: 09/21/22   Report to [facility] Main Entrance    Report to admitting at: 9:45 AM   Call this number if you have problems the morning of surgery [phone]   Do not eat food :After Midnight.   After Midnight you may have the following liquids until : 9:00 AM DAY OF SURGERY  Water Black Coffee (sugar ok, NO MILK/CREAM OR CREAMERS)  Tea (sugar ok, NO MILK/CREAM OR CREAMERS) regular and decaf                             Plain Jell-O (NO RED)                                           Fruit ices (not with fruit pulp, NO RED)                                     Popsicles (NO RED)                                                                  Juice: apple, WHITE grape, WHITE cranberry Sports drinks like Gatorade (NO RED)    Oral Hygiene is also important to reduce your risk of infection.                                    Remember - BRUSH YOUR TEETH THE MORNING OF SURGERY WITH YOUR REGULAR TOOTHPASTE  DENTURES WILL BE REMOVED PRIOR TO SURGERY PLEASE DO NOT APPLY "Poly grip" OR ADHESIVES!!!   Do NOT smoke after Midnight   Take these medicines the morning of surgery with A SIP OF WATER: carbidopa,amlodipine,glycopyrrolate(robinol)                              You may not  have any metal on your body including hair pins, jewelry, and body piercing             Do not wear lotions, powders, perfumes/cologne, or deodorant              Men may  shave face and neck.   Do not bring valuables to the hospital. [place].   Contacts, glasses, or bridgework may not be worn into surgery.   Bring small overnight bag day of surgery.   DO NOT [place]. PHARMACY WILL DISPENSE MEDICATIONS LISTED ON YOUR MEDICATION LIST TO YOU DURING YOUR ADMISSION [place]!    Patients discharged on the day of surgery will not be allowed to drive home.  Someone NEEDS to stay with you for the first 24 hours after anesthesia.   Special Instructions: Bring a copy of your healthcare power of attorney and living will documents         the day of surgery if you haven't scanned them before.              Please read over the following fact sheets you were given: IF YOU HAVE QUESTIONS ABOUT YOUR PRE-OP INSTRUCTIONS PLEASE CALL [phone]    [facility] - Preparing for Surgery Before surgery, you can play an important role.  Because skin is not sterile, your skin needs to be as free of germs as possible.  You can reduce the number of germs on your skin by washing with CHG (chlorahexidine gluconate) soap before surgery.  CHG is an antiseptic cleaner which kills germs and bonds with the skin to continue killing germs even after washing. Please DO NOT use if you have an allergy to CHG or antibacterial soaps.  If your skin becomes reddened/irritated stop using the CHG and inform your nurse when you arrive at Short Stay. Do not shave (including legs and underarms) for at least 48 hours prior to the first CHG shower.  You may shave your face/neck. Please follow these instructions carefully:  1.  Shower with CHG Soap the night before surgery and the  morning of Surgery.  2.  If you choose to wash your hair, wash  your hair first as usual with your  normal  shampoo.  3.  After you shampoo, rinse your hair and body thoroughly to remove the  shampoo.                           4.  Use CHG as you would any other liquid soap.  You can apply chg directly  to the skin and wash                       Gently with a scrungie or clean washcloth.  5.  Apply the CHG Soap to your body ONLY FROM THE NECK DOWN.   Do not use on face/ open                           Wound or open sores. Avoid contact with eyes, ears mouth and genitals (private parts).                       Wash face,  Genitals (private parts) with your normal soap.             6.  Wash thoroughly, paying special attention to the area where your surgery  will be performed.  7.  Thoroughly rinse your body with warm water from the neck down.  8.  DO NOT shower/wash  with your normal soap after using and rinsing off  the CHG Soap.                9.  Pat yourself dry with a clean towel.            10.  Wear clean pajamas.            11.  Place clean sheets on your bed the night of your first shower and do not  sleep with pets. Day of Surgery : Do not apply any lotions/deodorants the morning of surgery.  Please wear clean clothes to the hospital/surgery center.  FAILURE TO FOLLOW THESE INSTRUCTIONS MAY RESULT IN THE CANCELLATION OF YOUR SURGERY PATIENT SIGNATURE_________________________________  NURSE SIGNATURE__________________________________  ________________________________________________________________________

## 2022-09-10 ENCOUNTER — Encounter (HOSPITAL_COMMUNITY): Payer: Self-pay

## 2022-09-10 ENCOUNTER — Other Ambulatory Visit: Payer: Self-pay

## 2022-09-10 ENCOUNTER — Encounter (HOSPITAL_COMMUNITY)
Admission: RE | Admit: 2022-09-10 | Discharge: 2022-09-10 | Disposition: A | Discharge status: Home / Self Care | Payer: No Typology Code available for payment source | Source: Ambulatory Visit | Attending: Urology | Admitting: Urology

## 2022-09-10 VITALS — BP 140/60 | HR 61 | Temp 98.2°F | Ht 74.0 in | Wt 221.0 lb

## 2022-09-10 DIAGNOSIS — I2510 Atherosclerotic heart disease of native coronary artery without angina pectoris: Secondary | ICD-10-CM | POA: Diagnosis not present

## 2022-09-10 DIAGNOSIS — Z01818 Encounter for other preprocedural examination: Secondary | ICD-10-CM | POA: Insufficient documentation

## 2022-09-10 HISTORY — DX: Malignant (primary) neoplasm, unspecified: C80.1

## 2022-09-10 LAB — BASIC METABOLIC PANEL
Anion gap: 8 (ref 5–15)
BUN: 36 mg/dL — ABNORMAL HIGH (ref 8–23)
CO2: 26 mmol/L (ref 22–32)
Calcium: 9.3 mg/dL (ref 8.9–10.3)
Chloride: 104 mmol/L (ref 98–111)
Creatinine, Ser: 1.32 mg/dL — ABNORMAL HIGH (ref 0.61–1.24)
GFR, Estimated: 55 mL/min — ABNORMAL LOW (ref >60)
Glucose, Bld: 96 mg/dL (ref 70–99)
Potassium: 4.1 mmol/L (ref 3.5–5.1)
Sodium: 138 mmol/L (ref 135–145)

## 2022-09-10 LAB — CBC
HCT: 37.5 % — ABNORMAL LOW (ref 39.0–52.0)
Hemoglobin: 12.1 g/dL — ABNORMAL LOW (ref 13.0–17.0)
MCH: 29.1 pg (ref 26.0–34.0)
MCHC: 32.3 g/dL (ref 30.0–36.0)
MCV: 90.1 fL (ref 80.0–100.0)
Platelets: 172 10*3/uL (ref 150–400)
RBC: 4.16 MIL/uL — ABNORMAL LOW (ref 4.22–5.81)
RDW: 12.9 % (ref 11.5–15.5)
WBC: 5 10*3/uL (ref 4.0–10.5)
nRBC: 0 % (ref 0.0–0.2)

## 2022-09-10 NOTE — Progress Notes (Addendum)
For Short Stay: [place] appointment date:  Bowel Prep reminder:   For Anesthesia: PCP - Dr. Steva Ready Avva : Clearance: 09/09/22: Chart. Cardiologist - N/A  Chest x-ray -  EKG -  Stress Test -  ECHO -  Cardiac Cath -  Pacemaker/ICD device last checked: Pacemaker orders received: Device Rep notified:  Spinal Cord Stimulator:  Sleep Study - N/A CPAP -   Fasting Blood Sugar - N/A Checks Blood Sugar _____ times a day Date and result of last Hgb A1c-  Last dose of GLP1 agonist-  GLP1 instructions:   Last dose of SGLT-2 inhibitors-  SGLT-2 instructions:   Blood Thinner Instructions: Aspirin Instructions: Last Dose:  Activity level: Can go up a flight of stairs and activities of daily living without stopping and without chest pain and/or shortness of breath   Able to exercise without chest pain and/or shortness of breath  Anesthesia review: Hx: HTN  Patient denies shortness of breath, fever, cough and chest pain at PAT appointment   Patient verbalized understanding of instructions that were given to them at the PAT appointment. Patient was also instructed that they will need to review over the PAT instructions again at home before surgery.

## 2022-09-21 ENCOUNTER — Ambulatory Visit (HOSPITAL_COMMUNITY): Payer: No Typology Code available for payment source | Admitting: Physician Assistant

## 2022-09-21 ENCOUNTER — Ambulatory Visit (HOSPITAL_COMMUNITY)
Admission: RE | Admit: 2022-09-21 | Discharge: 2022-09-22 | Disposition: A | Discharge status: Home / Self Care | Payer: No Typology Code available for payment source | Source: Ambulatory Visit | Attending: Urology | Admitting: Urology

## 2022-09-21 ENCOUNTER — Encounter (HOSPITAL_COMMUNITY)
Admission: RE | Disposition: A | Discharge status: Home / Self Care | Payer: Self-pay | Source: Ambulatory Visit | Attending: Urology

## 2022-09-21 ENCOUNTER — Ambulatory Visit (HOSPITAL_BASED_OUTPATIENT_CLINIC_OR_DEPARTMENT_OTHER): Payer: No Typology Code available for payment source | Admitting: Certified Registered Nurse Anesthetist

## 2022-09-21 ENCOUNTER — Other Ambulatory Visit: Payer: Self-pay

## 2022-09-21 ENCOUNTER — Encounter (HOSPITAL_COMMUNITY): Payer: Self-pay | Admitting: Urology

## 2022-09-21 DIAGNOSIS — I739 Peripheral vascular disease, unspecified: Secondary | ICD-10-CM | POA: Diagnosis not present

## 2022-09-21 DIAGNOSIS — K219 Gastro-esophageal reflux disease without esophagitis: Secondary | ICD-10-CM | POA: Diagnosis not present

## 2022-09-21 DIAGNOSIS — I129 Hypertensive chronic kidney disease with stage 1 through stage 4 chronic kidney disease, or unspecified chronic kidney disease: Secondary | ICD-10-CM | POA: Insufficient documentation

## 2022-09-21 DIAGNOSIS — N138 Other obstructive and reflux uropathy: Secondary | ICD-10-CM | POA: Insufficient documentation

## 2022-09-21 DIAGNOSIS — N183 Chronic kidney disease, stage 3 unspecified: Secondary | ICD-10-CM | POA: Insufficient documentation

## 2022-09-21 DIAGNOSIS — R338 Other retention of urine: Secondary | ICD-10-CM | POA: Insufficient documentation

## 2022-09-21 DIAGNOSIS — G20A1 Parkinson's disease without dyskinesia, without mention of fluctuations: Secondary | ICD-10-CM | POA: Diagnosis not present

## 2022-09-21 DIAGNOSIS — N529 Male erectile dysfunction, unspecified: Secondary | ICD-10-CM | POA: Insufficient documentation

## 2022-09-21 DIAGNOSIS — Z79899 Other long term (current) drug therapy: Secondary | ICD-10-CM | POA: Insufficient documentation

## 2022-09-21 DIAGNOSIS — E785 Hyperlipidemia, unspecified: Secondary | ICD-10-CM | POA: Diagnosis not present

## 2022-09-21 DIAGNOSIS — C61 Malignant neoplasm of prostate: Secondary | ICD-10-CM | POA: Diagnosis not present

## 2022-09-21 DIAGNOSIS — N401 Enlarged prostate with lower urinary tract symptoms: Secondary | ICD-10-CM | POA: Diagnosis not present

## 2022-09-21 DIAGNOSIS — N320 Bladder-neck obstruction: Secondary | ICD-10-CM | POA: Diagnosis not present

## 2022-09-21 DIAGNOSIS — Z85820 Personal history of malignant melanoma of skin: Secondary | ICD-10-CM | POA: Insufficient documentation

## 2022-09-21 DIAGNOSIS — N400 Enlarged prostate without lower urinary tract symptoms: Secondary | ICD-10-CM

## 2022-09-21 HISTORY — PX: TRANSURETHRAL RESECTION OF PROSTATE: SHX73

## 2022-09-21 LAB — BASIC METABOLIC PANEL
Anion gap: 8 (ref 5–15)
BUN: 34 mg/dL — ABNORMAL HIGH (ref 8–23)
CO2: 26 mmol/L (ref 22–32)
Calcium: 8.6 mg/dL — ABNORMAL LOW (ref 8.9–10.3)
Chloride: 107 mmol/L (ref 98–111)
Creatinine, Ser: 1.33 mg/dL — ABNORMAL HIGH (ref 0.61–1.24)
GFR, Estimated: 54 mL/min — ABNORMAL LOW (ref >60)
Glucose, Bld: 108 mg/dL — ABNORMAL HIGH (ref 70–99)
Potassium: 3.8 mmol/L (ref 3.5–5.1)
Sodium: 141 mmol/L (ref 135–145)

## 2022-09-21 LAB — CBC
HCT: 33.9 % — ABNORMAL LOW (ref 39.0–52.0)
Hemoglobin: 11.2 g/dL — ABNORMAL LOW (ref 13.0–17.0)
MCH: 29.4 pg (ref 26.0–34.0)
MCHC: 33 g/dL (ref 30.0–36.0)
MCV: 89 fL (ref 80.0–100.0)
Platelets: 141 10*3/uL — ABNORMAL LOW (ref 150–400)
RBC: 3.81 MIL/uL — ABNORMAL LOW (ref 4.22–5.81)
RDW: 12.9 % (ref 11.5–15.5)
WBC: 4.1 10*3/uL (ref 4.0–10.5)
nRBC: 0 % (ref 0.0–0.2)

## 2022-09-21 SURGERY — TURP (TRANSURETHRAL RESECTION OF PROSTATE)
Anesthesia: General

## 2022-09-21 MED ORDER — FENTANYL CITRATE PF 50 MCG/ML IJ SOSY
PREFILLED_SYRINGE | INTRAMUSCULAR | Status: AC
  Filled 2022-09-21: qty 2

## 2022-09-21 MED ORDER — LOSARTAN POTASSIUM 50 MG PO TABS
50.0000 mg | ORAL_TABLET | Freq: Two times a day (BID) | ORAL | Status: DC
  Administered 2022-09-21 – 2022-09-22 (×2): 50 mg via ORAL
  Filled 2022-09-21 (×2): qty 1

## 2022-09-21 MED ORDER — CEPHALEXIN 500 MG PO CAPS: 500.0000 mg | ORAL_CAPSULE | Freq: Two times a day (BID) | ORAL | 0 refills | Status: AC

## 2022-09-21 MED ORDER — ONDANSETRON HCL 4 MG/2ML IJ SOLN
INTRAMUSCULAR | Status: AC
  Filled 2022-09-21: qty 2

## 2022-09-21 MED ORDER — OXYCODONE HCL 5 MG PO TABS: 5.0000 mg | ORAL_TABLET | Freq: Once | ORAL | Status: DC | PRN

## 2022-09-21 MED ORDER — ATORVASTATIN CALCIUM 20 MG PO TABS
20.0000 mg | ORAL_TABLET | Freq: Every evening | ORAL | Status: DC
  Administered 2022-09-21: 20 mg via ORAL
  Filled 2022-09-21: qty 1

## 2022-09-21 MED ORDER — DIPHENHYDRAMINE HCL 12.5 MG/5ML PO ELIX: 12.5000 mg | ORAL_SOLUTION | Freq: Four times a day (QID) | ORAL | Status: DC | PRN

## 2022-09-21 MED ORDER — DEXAMETHASONE SODIUM PHOSPHATE 10 MG/ML IJ SOLN
INTRAMUSCULAR | Status: AC
  Filled 2022-09-21: qty 1

## 2022-09-21 MED ORDER — DOCUSATE SODIUM 100 MG PO CAPS: 100.0000 mg | ORAL_CAPSULE | Freq: Every day | ORAL | 0 refills | Status: AC | PRN

## 2022-09-21 MED ORDER — AMLODIPINE BESYLATE 5 MG PO TABS
5.0000 mg | ORAL_TABLET | Freq: Every morning | ORAL | Status: DC
  Administered 2022-09-22: 5 mg via ORAL
  Filled 2022-09-21: qty 1

## 2022-09-21 MED ORDER — FENTANYL CITRATE (PF) 100 MCG/2ML IJ SOLN
INTRAMUSCULAR | Status: DC | PRN
  Administered 2022-09-21: 50 ug via INTRAVENOUS

## 2022-09-21 MED ORDER — SODIUM CHLORIDE 0.45 % IV SOLN: INTRAVENOUS | Status: DC

## 2022-09-21 MED ORDER — FENTANYL CITRATE PF 50 MCG/ML IJ SOSY
25.0000 ug | PREFILLED_SYRINGE | INTRAMUSCULAR | Status: DC | PRN
  Administered 2022-09-21 (×2): 50 ug via INTRAVENOUS

## 2022-09-21 MED ORDER — DIPHENHYDRAMINE HCL 50 MG/ML IJ SOLN: 12.5000 mg | Freq: Four times a day (QID) | INTRAMUSCULAR | Status: DC | PRN

## 2022-09-21 MED ORDER — OXYBUTYNIN CHLORIDE ER 5 MG PO TB24
10.0000 mg | ORAL_TABLET | Freq: Every day | ORAL | Status: DC
  Administered 2022-09-21 – 2022-09-22 (×2): 10 mg via ORAL
  Filled 2022-09-21 (×2): qty 2

## 2022-09-21 MED ORDER — PROPOFOL 10 MG/ML IV BOLUS
INTRAVENOUS | Status: DC | PRN
  Administered 2022-09-21: 160 mg via INTRAVENOUS

## 2022-09-21 MED ORDER — TRIPLE ANTIBIOTIC 3.5-400-5000 EX OINT
1.0000 | TOPICAL_OINTMENT | Freq: Three times a day (TID) | CUTANEOUS | Status: DC | PRN
  Administered 2022-09-21: 1 via TOPICAL

## 2022-09-21 MED ORDER — SODIUM CHLORIDE 0.9% FLUSH: 3.0000 mL | Freq: Two times a day (BID) | INTRAVENOUS | Status: DC

## 2022-09-21 MED ORDER — LACTATED RINGERS IV SOLN: INTRAVENOUS | Status: DC

## 2022-09-21 MED ORDER — HEPARIN SODIUM (PORCINE) 5000 UNIT/ML IJ SOLN
5000.0000 [IU] | Freq: Three times a day (TID) | INTRAMUSCULAR | Status: DC
  Administered 2022-09-22: 5000 [IU] via SUBCUTANEOUS
  Filled 2022-09-21: qty 1

## 2022-09-21 MED ORDER — SODIUM CHLORIDE 0.9 % IR SOLN
3000.0000 mL | Status: DC
  Administered 2022-09-21 (×3): 3000 mL

## 2022-09-21 MED ORDER — DEXAMETHASONE SODIUM PHOSPHATE 10 MG/ML IJ SOLN
INTRAMUSCULAR | Status: DC | PRN
  Administered 2022-09-21: 5 mg via INTRAVENOUS

## 2022-09-21 MED ORDER — ONDANSETRON HCL 4 MG/2ML IJ SOLN: 4.0000 mg | INTRAMUSCULAR | Status: DC | PRN

## 2022-09-21 MED ORDER — EPHEDRINE SULFATE (PRESSORS) 50 MG/ML IJ SOLN
INTRAMUSCULAR | Status: DC | PRN
  Administered 2022-09-21: 7.5 mg via INTRAVENOUS

## 2022-09-21 MED ORDER — ACETAMINOPHEN 325 MG PO TABS: 650.0000 mg | ORAL_TABLET | ORAL | Status: DC | PRN

## 2022-09-21 MED ORDER — MORPHINE SULFATE (PF) 2 MG/ML IV SOLN: 2.0000 mg | INTRAVENOUS | Status: DC | PRN

## 2022-09-21 MED ORDER — ZOLPIDEM TARTRATE 5 MG PO TABS: 5.0000 mg | ORAL_TABLET | Freq: Every evening | ORAL | Status: DC | PRN

## 2022-09-21 MED ORDER — BISACODYL 10 MG RE SUPP: 10.0000 mg | Freq: Every day | RECTAL | Status: DC | PRN

## 2022-09-21 MED ORDER — CEFAZOLIN SODIUM-DEXTROSE 2-4 GM/100ML-% IV SOLN
2.0000 g | INTRAVENOUS | Status: AC
  Administered 2022-09-21: 2 g via INTRAVENOUS
  Filled 2022-09-21: qty 100

## 2022-09-21 MED ORDER — OXYCODONE-ACETAMINOPHEN 5-325 MG PO TABS: 1.0000 | ORAL_TABLET | ORAL | 0 refills | Status: AC | PRN

## 2022-09-21 MED ORDER — POLYETHYLENE GLYCOL 3350 17 G PO PACK: 17.0000 g | PACK | Freq: Every day | ORAL | Status: DC | PRN

## 2022-09-21 MED ORDER — CARBIDOPA-LEVODOPA ER 50-200 MG PO TBCR
1.0000 | EXTENDED_RELEASE_TABLET | Freq: Every day | ORAL | Status: DC
  Administered 2022-09-21: 1 via ORAL
  Filled 2022-09-21: qty 1

## 2022-09-21 MED ORDER — SUCCINYLCHOLINE CHLORIDE 200 MG/10ML IV SOSY
PREFILLED_SYRINGE | INTRAVENOUS | Status: DC | PRN
  Administered 2022-09-21: 40 mg via INTRAVENOUS

## 2022-09-21 MED ORDER — LIDOCAINE HCL (PF) 2 % IJ SOLN
INTRAMUSCULAR | Status: AC
  Filled 2022-09-21: qty 5

## 2022-09-21 MED ORDER — PRAMIPEXOLE DIHYDROCHLORIDE ER 1.5 MG PO TB24
1.0000 | ORAL_TABLET | Freq: Every day | ORAL | Status: DC
  Administered 2022-09-21: 1.5 mg via ORAL

## 2022-09-21 MED ORDER — HYDROCHLOROTHIAZIDE 12.5 MG PO TABS
25.0000 mg | ORAL_TABLET | Freq: Every morning | ORAL | Status: DC
  Administered 2022-09-22: 25 mg via ORAL
  Filled 2022-09-21: qty 2

## 2022-09-21 MED ORDER — OXYCODONE HCL 5 MG/5ML PO SOLN: 5.0000 mg | Freq: Once | ORAL | Status: DC | PRN

## 2022-09-21 MED ORDER — OXYCODONE-ACETAMINOPHEN 5-325 MG PO TABS
1.0000 | ORAL_TABLET | ORAL | Status: DC | PRN
  Administered 2022-09-21 (×2): 1 via ORAL
  Filled 2022-09-21 (×2): qty 1

## 2022-09-21 MED ORDER — PROPOFOL 10 MG/ML IV BOLUS
INTRAVENOUS | Status: AC
  Filled 2022-09-21: qty 20

## 2022-09-21 MED ORDER — FENTANYL CITRATE (PF) 100 MCG/2ML IJ SOLN
INTRAMUSCULAR | Status: AC
  Filled 2022-09-21: qty 2

## 2022-09-21 MED ORDER — SODIUM CHLORIDE 0.9% FLUSH: 3.0000 mL | INTRAVENOUS | Status: DC | PRN

## 2022-09-21 MED ORDER — CARBIDOPA-LEVODOPA 25-100 MG PO TABS
2.0000 | ORAL_TABLET | Freq: Two times a day (BID) | ORAL | Status: DC
  Administered 2022-09-22 (×2): 2 via ORAL
  Filled 2022-09-21 (×2): qty 2

## 2022-09-21 MED ORDER — ONDANSETRON HCL 4 MG/2ML IJ SOLN: 4.0000 mg | Freq: Once | INTRAMUSCULAR | Status: DC | PRN

## 2022-09-21 MED ORDER — ONDANSETRON HCL 4 MG/2ML IJ SOLN
INTRAMUSCULAR | Status: DC | PRN
  Administered 2022-09-21: 4 mg via INTRAVENOUS

## 2022-09-21 MED ORDER — GLYCOPYRROLATE 1 MG PO TABS
1.0000 mg | ORAL_TABLET | Freq: Two times a day (BID) | ORAL | Status: DC
  Administered 2022-09-21 – 2022-09-22 (×2): 1 mg via ORAL
  Filled 2022-09-21 (×2): qty 1

## 2022-09-21 MED ORDER — TAMSULOSIN HCL 0.4 MG PO CAPS
0.4000 mg | ORAL_CAPSULE | Freq: Every day | ORAL | Status: DC
  Administered 2022-09-21: 0.4 mg via ORAL
  Filled 2022-09-21: qty 1

## 2022-09-21 MED ORDER — ORAL CARE MOUTH RINSE: 15.0000 mL | Freq: Once | OROMUCOSAL | Status: AC

## 2022-09-21 MED ORDER — CHLORHEXIDINE GLUCONATE 0.12 % MT SOLN
15.0000 mL | Freq: Once | OROMUCOSAL | Status: AC
  Administered 2022-09-21: 15 mL via OROMUCOSAL

## 2022-09-21 MED ORDER — CALCIUM CARBONATE ANTACID 500 MG PO CHEW: 2.0000 | CHEWABLE_TABLET | Freq: Every day | ORAL | Status: DC | PRN

## 2022-09-21 MED ORDER — LIDOCAINE 2% (20 MG/ML) 5 ML SYRINGE
INTRAMUSCULAR | Status: DC | PRN
  Administered 2022-09-21: 10 mg via INTRAVENOUS

## 2022-09-21 MED ORDER — SODIUM CHLORIDE 0.9 % IV SOLN: 250.0000 mL | INTRAVENOUS | Status: DC | PRN

## 2022-09-21 MED ORDER — SODIUM CHLORIDE 0.9 % IR SOLN
Status: DC | PRN
  Administered 2022-09-21: 12000 mL

## 2022-09-21 MED ORDER — CARBIDOPA-LEVODOPA 25-100 MG PO TABS
1.0000 | ORAL_TABLET | Freq: Every day | ORAL | Status: DC
  Administered 2022-09-21: 1 via ORAL
  Filled 2022-09-21: qty 1

## 2022-09-21 SURGICAL SUPPLY — 21 items
BAG URINE DRAIN 2000ML AR STRL (UROLOGICAL SUPPLIES) ×1 IMPLANT
BAG URO CATCHER STRL LF (MISCELLANEOUS) ×1 IMPLANT
CATH FOLEY 3WAY 30CC 24FR (CATHETERS)
CATH URTH STD 24FR FL 3W 2 (CATHETERS) IMPLANT
DRAPE FOOT SWITCH (DRAPES) ×1 IMPLANT
ELECT REM PT RETURN 15FT ADLT (MISCELLANEOUS) IMPLANT
GLOVE SURG LX STRL 7.5 STRW (GLOVE) ×1 IMPLANT
GOWN STRL REUS W/ TWL XL LVL3 (GOWN DISPOSABLE) ×1 IMPLANT
GOWN STRL REUS W/TWL XL LVL3 (GOWN DISPOSABLE) ×1
GUIDEWIRE STR DUAL SENSOR (WIRE) IMPLANT
HOLDER FOLEY CATH W/STRAP (MISCELLANEOUS) IMPLANT
IV CATH 14GX2 1/4 (CATHETERS) IMPLANT
KIT TURNOVER KIT A (KITS) IMPLANT
LOOP CUT BIPOLAR 24F LRG (ELECTROSURGICAL) IMPLANT
MANIFOLD NEPTUNE II (INSTRUMENTS) ×1 IMPLANT
PACK CYSTO (CUSTOM PROCEDURE TRAY) ×1 IMPLANT
SYR 30ML LL (SYRINGE) ×1 IMPLANT
SYR TOOMEY IRRIG 70ML (MISCELLANEOUS) ×1
SYRINGE TOOMEY IRRIG 70ML (MISCELLANEOUS) ×1 IMPLANT
TUBING CONNECTING 10 (TUBING) ×1 IMPLANT
TUBING UROLOGY SET (TUBING) ×1 IMPLANT

## 2022-09-21 NOTE — Anesthesia Procedure Notes (Signed)
Procedure Name: LMA Insertion Date/Time: 09/21/2022 12:18 PM  Performed by: Lind Covert, CRNAPre-anesthesia Checklist: Patient identified, Emergency Drugs available, Suction available, Patient being monitored and Timeout performed Patient Re-evaluated:Patient Re-evaluated prior to induction Oxygen Delivery Method: Circle system utilized Preoxygenation: Pre-oxygenation with 100% oxygen Induction Type: IV induction Ventilation: Mask ventilation without difficulty LMA: LMA inserted LMA Size: 5.0 Tube type: Oral Number of attempts: 1 Placement Confirmation: positive ETCO2 and breath sounds checked- equal and bilateral Tube secured with: Tape Dental Injury: Teeth and Oropharynx as per pre-operative assessment

## 2022-09-21 NOTE — Transfer of Care (Signed)
Immediate Anesthesia Transfer of Care Note  Patient: Zachary Wang.  Procedure(s) Performed: BIPOLAR TRANSURETHRAL RESECTION OF THE PROSTATE (TURP)  Patient Location: PACU  Anesthesia Type:General  Level of Consciousness: sedated  Airway & Oxygen Therapy: Patient Spontanous Breathing and Patient connected to face mask oxygen  Post-op Assessment: Report given to RN and Post -op Vital signs reviewed and stable  Post vital signs: Reviewed and stable  Last Vitals:  Vitals Value Taken Time  BP 122/56 09/21/22 1324  Temp    Pulse 32 09/21/22 1326  Resp 12 09/21/22 1326  SpO2 100 % 09/21/22 1326  Vitals shown include unvalidated device data.  Last Pain:  Vitals:   09/21/22 1054  TempSrc:   PainSc: 0-No pain         Complications: No notable events documented.

## 2022-09-21 NOTE — Anesthesia Postprocedure Evaluation (Signed)
Anesthesia Post Note  Patient: Zachary Wang.  Procedure(s) Performed: BIPOLAR TRANSURETHRAL RESECTION OF THE PROSTATE (TURP)     Patient location during evaluation: PACU Anesthesia Type: General Level of consciousness: awake and alert Pain management: pain level controlled Vital Signs Assessment: post-procedure vital signs reviewed and stable Respiratory status: spontaneous breathing, nonlabored ventilation and respiratory function stable Cardiovascular status: blood pressure returned to baseline and stable Postop Assessment: no apparent nausea or vomiting Anesthetic complications: no   No notable events documented.  Last Vitals:  Vitals:   09/21/22 1400 09/21/22 1415  BP: (!) 146/88 (!) 145/63  Pulse: (!) 34 (!) 35  Resp: 12 15  Temp:    SpO2: 94% 98%    Last Pain:  Vitals:   09/21/22 1415  TempSrc:   PainSc: Asleep                 Lynda Rainwater

## 2022-09-21 NOTE — Op Note (Signed)
Operative Note  Preoperative diagnosis:  1.  BPH with bladder outlet obstruction  Postoperative diagnosis: 1.  BPH with bladder outlet obstruction  Procedure(s): 1.  Bipolar transurethral resection of prostate  Surgeon: Rexene Alberts, MD  Assistants:  None  Anesthesia:  General  Complications:  None  EBL:  Minimal  Specimens: 1. Prostate chips ID Type Source Tests Collected by Time Destination  1 : Prostate chips Tissue PATH Prostate TURP SURGICAL PATHOLOGY Janith Lima, MD 09/21/2022 1237    Drains/Catheters: 1.  22Fr 3 way catheter with 51m water into balloon  Intraoperative findings:   Obstructed prostate at the apex, successful wide open prostatic fossa at the end of the case with excellent hemostasis.  Indication:  Zachary Wang is a 82y.o. male with BPH with bladder outlet obstruction presenting for transurethral resection of the prostate. He had a TRUS volume of 73cc and was in urinary retention. After thorough discussion including all relevant risk benefits and alternatives, he presents today for a bipolar TURP.  Description of procedure: The indication, alternatives, benefits and risks were discussed with the patient and informed consent was obtained.  Patient was brought to the operating room table, positioned supine, secured with a safety strap.  Pneumatic compression devices were placed on the lower extremities.  After the administration of intravenous antibiotics and general anesthesia, the patient was repositioned into the dorsal lithotomy position.  All pressure points were carefully padded.  A rectal examination was performed confirming a smooth symmetric enlarged gland.  The genitalia were prepped and draped in standard sterile manner.  A timeout was completed, verifying the correct patient, surgical procedure and positioning prior to beginning the procedure.  Isotonic sodium chloride was used for irrigation.  A 26 French continuous-flow resectoscope sheath  with the visual obturator and a 30 degree lens was advanced under direct vision into the bladder.  The anterior urethra appeared normal in its entirety. 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 He has significant regrowth at the apex of the prostate.  On cystoscopic evaluation, his bladder capacity appeared normal, the bladder wall was noted to expand symmetrically in all dimensions.  There were no tumors, stones or foreign bodies present. The bladder was trabeculated with normal-appearing mucosa.  Both ureteral orifices were in their normal anatomic positions with clear urinary reflux noted bilaterally.  The obturator was removed and replaced by the working element with a resection loop.  The location of the ureteral orifices and the prostatic configuration were again confirmed.  Starting at the bladder neck and proceeding distally to the verumontanum a transurethral section of the prostate was performed using bipolar using energy of 4 and 5 for cutting and coagulation, respectively.  The procedure began at the bladder neck at the 5 o'clock and 7 o'clock positions and carefully carried distally to the verumontanum, resecting the intervening prostatic adenoma.  Next the left lateral lobe was resected to the level of the transverse capsular fibers.  The identical procedure was performed on the right lobe.  Attention was then directed anteriorly and the resection was completed from the 10 o'clock to 2 o'clock positions.  All bleeding vessels were fulgurated achieving meticulous hemostasis.  The bladder was irrigated with a Toomey syringe, ensuring removal of all prostate chips which were sent to pathology for evaluation.  Having completed the resection and the chips removed, we again confirmed hemostasis with the loop with coagulating current.  Upon completion of the entire procedure, the bladder and posterior urethra were  reexamined, confirming open prostatic urethra and bladder neck  without evidence of bleeding or perforation.  Both ureteral orifices and the external sphincter were noted to be intact.  The resectoscope was withdrawn under direct vision and a 22 French three-way Foley catheter with a 30 cc balloon was inserted into the bladder.  The balloon was inflated with 30 cc of sterile water.  After multiple manual irrigations ensuring clear return of the irrigant, the procedure was terminated.  The catheter was attached to a drainage bag and continuous bladder irrigation was started with normal saline.  The patient was positioned supine.  At the end of the procedure, all counts were correct.  Patient tolerated the procedure well and was taken to the recovery room satisfactory condition.  Plan: Continuous bladder irrigation overnight with gentle Foley traction.  Plan to discharge home tomorrow with Foley catheter in place and void trial in the office in 3 days.  [facility]  Pager: [phone]

## 2022-09-21 NOTE — Discharge Instructions (Signed)

## 2022-09-21 NOTE — Anesthesia Preprocedure Evaluation (Addendum)
Anesthesia Evaluation  Patient identified by MRN, date of birth, ID band Patient awake    Reviewed: Allergy & Precautions, NPO status , Patient's Chart, lab work & pertinent test results  Airway Mallampati: III   Neck ROM: Full    Dental no notable dental hx. (+) Dental Advisory Given, Caps, Teeth Intact   Pulmonary neg pulmonary ROS   Pulmonary exam normal breath sounds clear to auscultation       Cardiovascular hypertension, Pt. on medications + Peripheral Vascular Disease  Normal cardiovascular exam Rhythm:Regular Rate:Normal  EKG  NSR, RBBB pattern + LAFB   Neuro/Psych Parkinson's disease  negative psych ROS   GI/Hepatic Neg liver ROS,GERD  Medicated,,  Endo/Other  Hyperlipidemia  Renal/GU negative Renal ROS   BPH    Musculoskeletal  (+) Arthritis , Osteoarthritis,  Hx/o melanoma left forearm 2006 S/P Excision w/ LND   Abdominal   Peds  Hematology negative hematology ROS (+)   Anesthesia Other Findings   Reproductive/Obstetrics                             Anesthesia Physical Anesthesia Plan  ASA: 2  Anesthesia Plan: General   Post-op Pain Management:    Induction: Intravenous  PONV Risk Score and Plan: 3 and Treatment may vary due to age or medical condition, Ondansetron and Dexamethasone  Airway Management Planned: LMA  Additional Equipment: None  Intra-op Plan:   Post-operative Plan: Extubation in OR  Informed Consent: I have reviewed the patients History and Physical, chart, labs and discussed the procedure including the risks, benefits and alternatives for the proposed anesthesia with the patient or authorized representative who has indicated his/her understanding and acceptance.     Dental advisory given  Plan Discussed with: Anesthesiologist and CRNA  Anesthesia Plan Comments:         Anesthesia Quick Evaluation

## 2022-09-21 NOTE — H&P (Signed)
Office Visit Report     08/20/2022   --------------------------------------------------------------------------------   Zachary Wang  MRN: [id number]  DOB: 1941-06-26, 82 year old Male   PRIMARY CARE:  Zachary Wang, Zachary Wang  PRIMARY CARE FAX:  [phone]  REFERRING:  Zachary Wang, Zachary Wang  PROVIDER:  Rexene Wang, M.D.  LOCATION:  [facility] [phone]     --------------------------------------------------------------------------------   CC/HPI: Zachary Wang is a 83 year old male seen in follow-up today for elevated PSA and urinary retention.   1. Elevated PSA:  He has had a history of an elevated PSA since 2000. PSA in 06/1999 was 4.7. TRUS biopsy 06/1999 resulted benign in all cores. Repeat TRUS biopsy 09/2000 was also benign in all cores. He also underwent TURP in 2017 with BPH only.  PSA trend:  4.7 in 06/1999  4.1 in 03/2016  3.36 on 04/20/2017  3.8 on 05/25/2018  3.7 on 05/11/2019  5.4 on 05/09/2020  5.014 on 09/09/2020  6.9 on 01/07/2021  6.34 on 06/05/2021  5.41 (25% free on 12/16/2021)  5.99 on 06/18/2022   MRI prostate 04/02/2021 with 51 cm prostate with 2 separate PI-RADS category 3 lesions in the peripheral zone. He understands that this is equivocal with about 50% chance of clinically significant cancer of biopsy. He prefers continued PSA surveillance given his age and comorbidities. He declines biopsy.   He denies a family history of prostate cancer. He denies taking 5 alpha reductase inhibitors. He denies recent urinary tract infection. He denies taking anticoagulation.  He denies bone pain or unexpected weight loss. He has stable appetite.   #2. BPH:  -He is s/p TURP in 2017 with excellent results.  -IPSS score in 06/2022 was 8, quality-of-life 1 without use of medication. He presented on 08/06/2022 with several week history of pelvic pain, pressure, incontinence and weak flow of stream. PVR with greater than 1 L present. Foley placed.  -TRUS 08/18/2022 with  73 cc prostate. UDS on 08/18/2022 with Max capacity 501 mL, for sensation 315 mL, able to generate voluntary contraction however he could not void with max detrusor pressure 22 cm H2O.  -Cystoscopy 08/20/2022 with recurrent bilobar obstruction with regrowth towards the apex.   He is tolerating the Foley catheter well with no complaints.   #3. Erectile dysfunction:SHIM score is 2. He has difficulty obtaining erection. He has never tried PDE 5 inhibitor. He denies taking nitroglycerin. He would like to be treated with sildenafil.   Patient currently denies fever, chills, sweats, nausea, vomiting, abdominal or flank pain, gross hematuria or dysuria.   He does have a past medical history of CKD stage III, hyperlipidemia, well-functioning Parkinson's disease.   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     ALLERGIES: Mold No Allergies    MEDICATIONS: Tamsulosin Hcl 0.4 mg capsule 1 capsule PO Q HS  Amlodipine Besylate 5 mg tablet Oral  Carbidopa-Levodopa Er 50 mg-200 mg tablet, extended release  HydroCHLOROthiazide 25 MG Oral Tablet Oral  Lipitor 20 mg tablet Oral  Losartan Potassium 100 mg tablet Oral  Mirapex 1.5 mg tablet  Sildenafil Citrate 100 mg tablet 1 tablet PO Daily Take as needed  Sinemet 25-100 25 mg-100 mg tablet     GU PSH: Complex cystometrogram, w/ void pressure and urethral pressure profile studies, any technique - 08/18/2022 Complex Uroflow - 08/18/2022 Cystoscopy TURP - 2017 Emg surf Electrd - 08/18/2022 Inject For cystogram - 08/18/2022 Intrabd voidng Press - 08/18/2022 Open SP Tube Placement - 2017 Prostate Needle  Biopsy - 2014, 2014 Varicocele repair - 2014       Zachary Wang Notes: Transurethral Resection Of Prostate (TURP), Bladder Cystotomy With Drainage, Shoulder Surgery Left, Surgery Spermatic Cord Excision Of Varicocele Left, Biopsy Of The Prostate Needle, Biopsy Of The Prostate Needle, Forearm Excision, Fistula-in-ano Repair   NON-GU PSH: Remove Anal Fistula - 2009      GU PMH: Urinary Retention - 08/18/2022, (Stable), - 08/06/2022 BPH w/LUTS (Stable) - 08/06/2022 Elevated PSA - 08/06/2022, - 06/25/2022, - 12/23/2021, - 06/17/2021 (Stable), - 03/11/2021 (Stable), He has a distant history of an elevated PSA which returned to normal after transurethral resection of his prostate indicating that it was due to the size of his prostate as his pathology was benign. At this point I recommended he continue to undergo annual you DRE and PSA., - 2019 (Stable), His PSA has steadily fallen and is normal currently at 3.35 with no worrisome findings noted on DRE. I will continue yearly prostate screening with DRE and PSA., - 2018 (Improving), His PSA has fallen since his transurethral resection and his DRE is benign. When I recommended as we follow his PSA again in 6 months and if it remains low and we could potentially go to yearly checks after that., - 2017 (Improving), His PSA has come down due to his previous TURP. I will continue to monitor this., - 2017, Elevated prostate specific antigen (PSA), - 2017, PSA,Elevated, - 2014 BPH w/o LUTS - 06/25/2022, - 12/23/2021, - 06/17/2021, - 03/11/2021 (Improving), He has had significant improvement in his voiding after his TURP. This has resulted in retrograde ejaculation with decreased ejaculate volume but we discussed the benign nature of this., - 2017 ED due to arterial insufficiency - 06/25/2022, - 12/23/2021, - 06/17/2021 (Stable), - 03/11/2021 (Stable), We did discuss pharmacologic management of his erectile dysfunction. He said he would give some thought and contact me if you wanted to proceed with treatment., - 2018 (Stable), He continued to have difficulty with edema did not want to consider further therapy., - 2017 Bladder Diverticulum, Bladder diverticulum - 2017 Hydronephrosis Unspec, Hydronephrosis, bilateral - 2017 Overactive bladder, Overactive bladder - 2016 Male ED, unspecified, Erectile dysfunction - 2014 Spermatocele of epididymis,  Unspec, Spermatocele - 2014 Testicular atrophy, Testicular atrophy - 2014 Urinary Frequency, Increased urinary frequency - 2014      PMH Notes: Elevated PSA: PSA 4.7 in 11/00  TRUS/BX 11/00: Benign  Repeat TRUS/BX 2/02: Benign   BPH with outlet obstruction--->Obstructive uropathy: This has been present for many years. He had been managed with an alpha-blocker and eventually a 5 alpha reductase inhibitor due to his large prostate size.  He then developed small frequent voidings. A CT scan on 08/27/15 revealed normal-appearing kidneys with bilateral hydronephrosis down to a distended bladder with a diverticulum involving the right wall and a minute calcification on the floor of the bladder. Cr. - 1.6.  Treatment 09/30/15: 32 g  Pathology: BPH   Left varicocele: He underwent left varicocelectomy by Dr. Serita Butcher in the distant past.   Organic erectile dysfunction: This was managed initially with Viagra and then a trial of Levitra.   LUTS: He developed significant urinary frequency and underwent UDS in 6/07 which revealed a small capacity unstable bladder with slight outlet obstruction and a PVR of 42 cc. He was tried on multiple anticholinergics (Ditropan, Sanctura and Detrol LA) and eventually was referred to physical therapy.+  Urodynamics 09/10/15: He has an elevated cystometric capacity and a bladder that clearly has been under pressure for  some time with multiple pseudo-diverticuli and left-sided reflux.   Left spermatocele and right testicular atrophy: These were noted on scrotal ultrasound in 8/02.   Hypogonadism: He was found to have a low serum testosterone but testosterone replacement therapy was not undertaken because of his history of elevated PSA.   NON-GU PMH: Personal history of other diseases of the circulatory system, History of hypertension - 2014 Personal history of other endocrine, nutritional and metabolic disease, History of hypercholesterolemia - 2014 Personal history of  other specified conditions, History of urinary frequency - 2014 Arthritis Cardiac murmur, unspecified Hypercholesterolemia Hypertension    FAMILY HISTORY: 3 daughters - Runs in Family Alzheimer's Disease - Mother Death In The Family Father - Runs In Family Death In The Family Mother - Runs In Family Emphysema - Father Family Health Status Number - Father nephrolithiasis - Runs In Family   SOCIAL HISTORY: Marital Status: Married Preferred Language: English; Ethnicity: Not Hispanic Or Latino; Race: White Current Smoking Status: Patient has never smoked.  Does drink.  Drinks 3 caffeinated drinks per day.     Notes: Never A Smoker, Caffeine Use, Alcohol Use, Marital History - Currently Married, Tobacco Use, Occupation:   REVIEW OF SYSTEMS:    GU Review Male:   Patient denies frequent urination, hard to postpone urination, burning/ pain with urination, get up at night to urinate, leakage of urine, stream starts and stops, trouble starting your stream, have to strain to urinate , erection problems, and penile pain.  Gastrointestinal (Upper):   Patient denies nausea, vomiting, and indigestion/ heartburn.  Gastrointestinal (Lower):   Patient denies diarrhea and constipation.  Constitutional:   Patient denies fever, night sweats, weight loss, and fatigue.  Skin:   Patient denies skin rash/ lesion and itching.  Eyes:   Patient denies blurred vision and double vision.  Ears/ Nose/ Throat:   Patient denies sore throat and sinus problems.  Hematologic/Lymphatic:   Patient denies swollen glands and easy bruising.  Cardiovascular:   Patient denies leg swelling and chest pains.  Respiratory:   Patient denies cough and shortness of breath.  Endocrine:   Patient denies excessive thirst.  Musculoskeletal:   Patient denies back pain and joint pain.  Neurological:   Patient denies headaches and dizziness.  Psychologic:   Patient denies depression and anxiety.   VITAL SIGNS: None   MULTI-SYSTEM  PHYSICAL EXAMINATION:    Constitutional: Well-nourished. No physical deformities. Normally developed. Good grooming.  Respiratory: No labored breathing, no use of accessory muscles.   Cardiovascular: Normal temperature, normal extremity pulses, no swelling, no varicosities.  Gastrointestinal: No mass, no tenderness, no rigidity, non obese abdomen.     Complexity of Data:  Source Of History:  Patient, Medical Record Summary  Lab Test Review:   PSA  Records Review:   AUA Symptom Score, Previous Doctor Records, Previous Patient Records  Urine Test Review:   Urinalysis  Urodynamics Review:   Review Bladder Scan   06/18/22 12/16/21 06/10/21 03/11/21 02/04/18 01/20/17 07/22/16 01/22/16  PSA  Total PSA 5.99 ng/mL 5.41 ng/mL 6.34 ng/mL 6.32 ng/mL 3.73 ng/mL 3.35 ng/dl 3.74  4.72 ng/dl  Free PSA  1.37 ng/mL  1.72 ng/mL    0.54 ng/dl  % Free PSA  25 % PSA  27 % PSA    11 %    11/30/06 12/11/05 11/27/04 05/22/04  Hormones  Testosterone, Total 2.65  2.91  2.94  2.08     PROCEDURES:         Flexible Cystoscopy -  52000  Risks, benefits, and some of the potential complications of the procedure were discussed at length with the patient including infection, bleeding, voiding discomfort, urinary retention, fever, chills, sepsis, and others. All questions were answered. Informed consent was obtained. Antibiotic prophylaxis was given. Sterile technique and intraurethral analgesia were used.  Meatus:  Normal size. Normal location. Normal condition.  Urethra:  No strictures.  External Sphincter:  Normal.  Verumontanum:  Normal.  Prostate:  Obstructing. Moderate hyperplasia. Regrowth of bilateral apex  Bladder Neck:  Non-obstructing.  Ureteral Orifices:  Normal location. Normal size. Normal shape. Effluxed clear urine.  Bladder:  No trabeculation. No tumors. Normal mucosa. No stones.      The lower urinary tract was carefully examined. The procedure was well-tolerated and without complications.  Antibiotic instructions were given. Instructions were given to call the office immediately for bloody urine, difficulty urinating, urinary retention, painful or frequent urination, fever, chills, nausea, vomiting or other illness. The patient stated that he understood these instructions and would comply with them.        Simple Foley Catheterization - [id number]  Pt was prepped and cleaned. A 16 French Foley COUDE catheter was inserted into the bladder using sterile technique. A leg bag was connected. Pt tolerated procedure well.    ASSESSMENT:      ICD-10 Details  1 GU:   BPH w/LUTS - N40.1   2   Elevated PSA - R97.20   3   Urinary Retention - R33.8    PLAN:           Document Letter(s):  Created for Patient: Clinical Summary         Notes:    1. Elevated PSA  -I reviewed his PSA trend and previous biopsy history as above. We reviewed that his PSA is stable.  -I reviewed his prostate MRI in 2022 with 2 separate PI-RADS category 3 lesions  -We discussed options including standard TRUS biopsy, MRI fusion biopsy, trending PSA. He elects to continue to trend PSA. He understands that he has about 50% chance of clinically significant cancer if these lesions were to be biopsied. He understands that at this age and other comorbidities, he elects for continued surveillance. We did discuss the fact that in general, we stop screening for prostate cancer after age 30. He understands that he has a low but not 0 chance of having an undiagnosed prostate cancer.  -He declines prostate biopsy for now and would like to proceed with TURP. He understands that this may uncover prostate cancer.   2. BPH without LUTS: S/p TURP in 2017.  -Remains in urinary retention. Reviewed TRUS findings, UDS and cystoscopy as above. We discussed options including TURP. Discussed that this would be best chance to be catheter free however I cannot guarantee that he would be catheter free after this procedure as he has a low detrusor  pressure.  -Discussed risk and benefits he elects proceed with TURP. I discussed risk of retention, continued obstructive symptoms. Also discussed risk of recovering undiagnosed prostate cancer.  -Surgery letter sent   Risks and benefits of Transurethral Resection of the Prostate were reviewed in detail including infection, bleeding, blood transfusion, injury to bladder/urethra/surrounding structures, erectile dysfunction, urinary incontinence, bladder neck contracture, persistent obstructive and irritative voiding symptoms, and global anesthesia risks including but not limited to CVA, MI, DVT, PE, pneumonia, and death. He expressed understanding and desire to proceed.    #3 Erectile dysfunction: Continue sildenafil as needed.   CC: Zachary Avva,  Zachary Wang    Urology Preoperative H&P   Chief Complaint: BPH with urinary retention  History of Present Illness: Zachary Wang. is a 82 y.o. male with BPH with urinary retention here for TURP. Denies fevers, chills, dysuria.   Past Medical History:  Diagnosis Date   Arthritis    hands   BPH (benign prostatic hyperplasia)    Cancer (HCC)    skin cancer   Elevated PSA    Foley catheter in place    History of adenomatous polyp of colon    2014  tubular adenoma   History of malignant melanoma    s/p  excision left forearm and left axill lymph node bx 09-10-2004   Hyperlipidemia    Hypertension    Peripheral vascular disease [facility])    Urinary retention     Past Surgical History:  Procedure Laterality Date   ANAL FISSURE REPAIR  08/17/1982   COLONOSCOPY N/A 05/02/2013   Procedure: COLONOSCOPY;  Surgeon: Garlan Fair, Zachary Wang;  Location: [facility];  Service: Endoscopy;  Laterality: N/A;   INSERTION OF SUPRAPUBIC CATHETER N/A 09/30/2015   Procedure: INSERTION OF SUPRAPUBIC CATHETER;  Surgeon: Kathie Rhodes, Zachary Wang;  Location: [facility];  Service: Urology;  Laterality: N/A;   SHOULDER ARTHROSCOPY W/ SUBACROMIAL DECOMPRESSION  AND DISTAL CLAVICLE EXCISION Left 05/20/2010   w/  Debridement , Bursectomy, Acromioplasty,  Capsulectomy   TONSILLECTOMY     TRANSURETHRAL RESECTION OF PROSTATE N/A 09/30/2015   Procedure: TURP (TRANSURETHRAL RESECTION OF PROSTATE WITH GYRUS;  Surgeon: Kathie Rhodes, Zachary Wang;  Location: [place];  Service: Urology;  Laterality: N/A;   WIDE EXCISION MALIGNANT MELANOMA LEFT FOREARM/  LEFT AXILLA LYMPH NODE BX  09/10/2004    Allergies: No Known Allergies  Family History  Problem Relation Age of Onset   Dementia Mother    Emphysema Father    Breast cancer Sister    Colon cancer Sister    Healthy Child     Social History:  reports that he has never smoked. He has never used smokeless tobacco. He reports current alcohol use. He reports that he does not use drugs.  ROS: A complete review of systems was performed.  All systems are negative except for pertinent findings as noted.  Physical Exam:  Vital signs in last 24 hours: Temp:  [97.6 F (36.4 C)] 97.6 F (36.4 C) (02/05 0947) Pulse Rate:  [91] 91 (02/05 0947) Resp:  [17] 17 (02/05 0947) BP: (154)/(70) 154/70 (02/05 0947) SpO2:  [98 %] 98 % (02/05 0947) Constitutional:  Alert and oriented, No acute distress Cardiovascular: Regular rate and rhythm Respiratory: Normal respiratory effort, Lungs clear bilaterally GI: Abdomen is soft, nontender, nondistended, no abdominal masses GU: No CVA tenderness Lymphatic: No lymphadenopathy Neurologic: Grossly intact, no focal deficits Psychiatric: Normal mood and affect  Laboratory Data:  No results for input(s): "WBC", "HGB", "HCT", "PLT" in the last 72 hours.  No results for input(s): "NA", "K", "CL", "GLUCOSE", "BUN", "CALCIUM", "CREATININE" in the last 72 hours.  Invalid input(s): "CO3"   No results found for this or any previous visit (from the past 24 hour(s)). No results found for this or any previous visit (from the past 240 hour(s)).  Renal Function: No results  for input(s): "CREATININE" in the last 168 hours. Estimated Creatinine Clearance: 57.4 mL/min (A) (by C-G formula based on SCr of 1.32 mg/dL (H)).  Radiologic Imaging: No results found.  I independently reviewed the above imaging studies.  Assessment and Plan Zachary Wang  Zachary Colomb. is a 82 y.o. male with Pateros with urinary retention here for TURP.  Risks and benefits of Transurethral Resection of the Prostate were reviewed in detail including infection, bleeding, blood transfusion, injury to bladder/urethra/surrounding structures, erectile dysfunction, urinary incontinence, bladder neck contracture, persistent obstructive and irritative voiding symptoms, and global anesthesia risks including but not limited to CVA, MI, DVT, PE, pneumonia, and death. He expressed understanding and desire to proceed.     Zachary Wang 09/21/2022, 10:32 AM  [facility] Pager: [phone]): [phone]

## 2022-09-22 ENCOUNTER — Encounter (HOSPITAL_COMMUNITY): Payer: Self-pay | Admitting: Urology

## 2022-09-22 DIAGNOSIS — N401 Enlarged prostate with lower urinary tract symptoms: Secondary | ICD-10-CM | POA: Diagnosis not present

## 2022-09-22 LAB — CBC
HCT: 31.9 % — ABNORMAL LOW (ref 39.0–52.0)
Hemoglobin: 10.8 g/dL — ABNORMAL LOW (ref 13.0–17.0)
MCH: 29.7 pg (ref 26.0–34.0)
MCHC: 33.9 g/dL (ref 30.0–36.0)
MCV: 87.6 fL (ref 80.0–100.0)
Platelets: 170 10*3/uL (ref 150–400)
RBC: 3.64 MIL/uL — ABNORMAL LOW (ref 4.22–5.81)
RDW: 12.8 % (ref 11.5–15.5)
WBC: 7.8 10*3/uL (ref 4.0–10.5)
nRBC: 0 % (ref 0.0–0.2)

## 2022-09-22 LAB — BASIC METABOLIC PANEL
Anion gap: 9 (ref 5–15)
BUN: 30 mg/dL — ABNORMAL HIGH (ref 8–23)
CO2: 25 mmol/L (ref 22–32)
Calcium: 8.9 mg/dL (ref 8.9–10.3)
Chloride: 105 mmol/L (ref 98–111)
Creatinine, Ser: 1.14 mg/dL (ref 0.61–1.24)
GFR, Estimated: >60 mL/min (ref >60)
Glucose, Bld: 135 mg/dL — ABNORMAL HIGH (ref 70–99)
Potassium: 4.3 mmol/L (ref 3.5–5.1)
Sodium: 139 mmol/L (ref 135–145)

## 2022-09-22 LAB — SURGICAL PATHOLOGY

## 2022-09-22 MED ORDER — CHLORHEXIDINE GLUCONATE CLOTH 2 % EX PADS
6.0000 | MEDICATED_PAD | Freq: Every day | CUTANEOUS | Status: DC
  Administered 2022-09-22: 6 via TOPICAL

## 2022-09-22 NOTE — Discharge Summary (Signed)
Date of admission: 09/21/2022  Date of discharge: 09/22/2022  Admission diagnosis: BPH  Discharge diagnosis: BPH  Secondary diagnoses: None  History and Physical: For full details, please see admission history and physical. Briefly, Zachary Plog. is a 82 y.o. year old patient with BPH with urinary retention who underwent TURP.  Hospital Course: The patient recovered in the usual expected fashion.  He had his diet advanced slowly.  Initially managed with IV pain control, then transitioned to PO meds when he was tolerating oral intake.  His labs were stable throughout the hospital course.  He was discharged to home on POD# 1.  At the time of discharge the patient was tolerating a regular diet, passing flatus, ambulating, had adequate pain control and was agreeable to discharge.  Follow up as scheduled.    Laboratory values:  Recent Labs    09/21/22 1341 09/22/22 0432  HGB 11.2* 10.8*  HCT 33.9* 31.9*   Recent Labs    09/21/22 1341 09/22/22 0432  CREATININE 1.33* 1.14    Disposition: Home  Discharge instruction: The patient was instructed to be ambulatory but told to refrain from heavy lifting, strenuous activity, or driving.   Discharge medications:  Allergies as of 09/22/2022   No Known Allergies      Medication List     TAKE these medications    acetaminophen 650 MG CR tablet Commonly known as: TYLENOL Take 650 mg by mouth daily as needed for pain.   amLODipine 5 MG tablet Commonly known as: NORVASC Take 5 mg by mouth every morning.   atorvastatin 20 MG tablet Commonly known as: LIPITOR Take 20 mg by mouth every evening.   calcium carbonate 500 MG chewable tablet Commonly known as: TUMS - dosed in mg elemental calcium Chew 2 tablets by mouth daily as needed for indigestion or heartburn.   carbidopa-levodopa 25-100 MG tablet Commonly known as: SINEMET IR 2 at 7am, 2 at 11am, 1 at 4pm What changed:  how much to take how to take this when to take this    carbidopa-levodopa 50-200 MG tablet Commonly known as: SINEMET CR Take 1 tablet by mouth at bedtime. What changed: Another medication with the same name was changed. Make sure you understand how and when to take each.   cephALEXin 500 MG capsule Commonly known as: KEFLEX Take 1 capsule (500 mg total) by mouth 2 (two) times daily for 6 doses.   docusate sodium 100 MG capsule Commonly known as: Colace Take 1 capsule (100 mg total) by mouth daily as needed for up to 30 doses.   glycopyrrolate 1 MG tablet Commonly known as: Robinul Take 1 tablet (1 mg total) by mouth 2 (two) times daily.   hydrochlorothiazide 25 MG tablet Commonly known as: HYDRODIURIL Take 25 mg by mouth every morning.   ibuprofen 200 MG tablet Commonly known as: ADVIL Take 200 mg by mouth daily as needed for moderate pain.   LIQUID TEARS OP Place 1 drop into both eyes daily as needed (irritation).   losartan 100 MG tablet Commonly known as: COZAAR Take 50 mg by mouth 2 (two) times daily.   oxyCODONE-acetaminophen 5-325 MG tablet Commonly known as: Percocet Take 1 tablet by mouth every 4 (four) hours as needed for up to 12 doses for severe pain.   Pramipexole Dihydrochloride 1.5 MG Tb24 Take 1 tablet (1.5 mg total) by mouth at bedtime.   tamsulosin 0.4 MG Caps capsule Commonly known as: FLOMAX Take 0.4 mg by mouth at bedtime.  Followup:   Follow-up Information     [facility] Follow up on 09/24/2022.   Why: 8AM Contact information: [place] [facility]  Pager: [phone]

## 2022-09-22 NOTE — Plan of Care (Signed)
  Problem: Pain Managment: Goal: General experience of comfort will improve Outcome: Progressing   Problem: Safety: Goal: Ability to remain free from injury will improve Outcome: Progressing   Problem: Skin Integrity: Goal: Risk for impaired skin integrity will decrease Outcome: Progressing   

## 2022-10-13 ENCOUNTER — Other Ambulatory Visit (HOSPITAL_COMMUNITY): Payer: Self-pay | Admitting: Registered Nurse

## 2022-10-13 ENCOUNTER — Ambulatory Visit (HOSPITAL_COMMUNITY)
Admission: RE | Admit: 2022-10-13 | Discharge: 2022-10-13 | Disposition: A | Discharge status: Home / Self Care | Payer: Medicare Other | Source: Ambulatory Visit | Attending: Internal Medicine | Admitting: Internal Medicine

## 2022-10-13 DIAGNOSIS — M7989 Other specified soft tissue disorders: Secondary | ICD-10-CM | POA: Diagnosis not present

## 2022-10-13 DIAGNOSIS — R600 Localized edema: Secondary | ICD-10-CM | POA: Insufficient documentation

## 2022-10-13 DIAGNOSIS — R609 Edema, unspecified: Secondary | ICD-10-CM

## 2022-10-13 NOTE — Progress Notes (Signed)
Bilateral lower extremity venous study completed.   Attempted to call preliminary results relayed to [place] office.  Please see CV Procedures for preliminary results.  Lenay Lovejoy, RVT  4:03 PM 10/13/22

## 2022-10-16 ENCOUNTER — Ambulatory Visit (INDEPENDENT_AMBULATORY_CARE_PROVIDER_SITE_OTHER): Payer: No Typology Code available for payment source | Admitting: Neurology

## 2022-10-16 DIAGNOSIS — K117 Disturbances of salivary secretion: Secondary | ICD-10-CM | POA: Diagnosis not present

## 2022-10-16 MED ORDER — RIMABOTULINUMTOXINB 5000 UNIT/ML IM SOLN
5000.0000 [IU] | Freq: Once | INTRAMUSCULAR | Status: AC
  Administered 2022-10-16: 5000 [IU] via INTRAMUSCULAR

## 2022-10-16 NOTE — Procedures (Signed)
Botulinum Clinic    History:  Diagnosis: Sialorrhea    Result History  Working well.    Consent obtained from: The patient The patient was educated on the botulinum toxin the black blox warning and given a copy of the botox patient medication guide.  The patient understands that this warning states that there have been reported cases of the Botox extending beyond the injection site and creating adverse effects, similar to those of botulism. This included loss of strength, trouble walking, hoarseness, trouble saying words clearly, loss of bladder control, trouble breathing, trouble swallowing, diplopia, blurry vision and ptosis. Most of the distant spread of Botox was happening in patients, primarily children, who received medication for spasticity or for cervical dystonia. The patient expressed understanding and desire to proceed.     Injections  Location Left  Right Units Number of sites  Submandibular gland 250 250 500 1 per side  Parotid 2250 2250 2500 1 per side  TOTAL UNITS:     5000      Type of Toxin: Myobloc type B As ordered and injected IM at today's visit Total Units: 5000  Discarded Units: 0  Needle drawback with each injection was free of blood. Pt tolerated procedure well without complications.   Reinjection is anticipated in 3 months.

## 2022-12-23 NOTE — Progress Notes (Signed)
Assessment/Plan:   1.  Parkinsons Disease  -Continue carbidopa/levodopa 25/100, 2/2/1  -Continue carbidopa/levodopa 50/200 at bedtime for cramping of feet/legs  -Continue pramipexole ER, 1.5 mg daily.  No compulsive behaviors.   -Genetic testing completed.  Heterozygous for GBA mutation.  This could provide a risk factor for inherited Parkinson's, but likely would also require an environmental component.  This is different from homozygous carrier, which would have a much greater risk factor (10-20 times).   2.  History of malignant melanoma  -Continues to see dermatology several times per year.  3.  Sialorrhea  -thinks that myobloc wearing off before time but the Myobloc does help.  He wants to continue it.  -Continue Robinul, 1 mg twice per day.  No side effects currently.  4.  Bph  -s/p TURP in 09/2022  -following with urology  5.  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  On several BP medications and doesn't sound like BP running low  -discuss with pcp  -robinul may contribute?  -discussed increasing water intake.  He is doing better with less soda/coke intake.   Subjective:   Zachary Wang. was seen today in follow up for Parkinsons disease.  My previous records were reviewed prior to todays visit as well as outside records available to me.  Patient doing fairly well from a Parkinson's standpoint.  He has continued Myobloc, but last visit we added Robinul as well.  He reports that this helped.  He did have a TURP in feb.  Notes reviewed.  He c/o dizziness.  States that BP running 120's-130's at home.  No falls but had some "stumbles" when getting OOB at night.  No hallucinations.  Does have floaters.  Current prescribed movement disorder medications: Carbidopa/levodopa 25/100, 2/2/1 Pramipexole ER, 1.5 mg daily Carbidopa/levodopa 50/200 CR at bedtime  Robinul, 1 mg twice per day (started last visit)  ALLERGIES:  No Known  Allergies  CURRENT MEDICATIONS:  Outpatient Encounter Medications as of 12/29/2022  Medication Sig   acetaminophen (TYLENOL) 650 MG CR tablet Take 650 mg by mouth daily as needed for pain.   amLODipine (NORVASC) 5 MG tablet Take 5 mg by mouth every morning.   atorvastatin (LIPITOR) 20 MG tablet Take 20 mg by mouth every evening.    calcium carbonate (TUMS - DOSED IN MG ELEMENTAL CALCIUM) 500 MG chewable tablet Chew 2 tablets by mouth daily as needed for indigestion or heartburn.   carbidopa-levodopa (SINEMET CR) 50-200 MG tablet Take 1 tablet by mouth at bedtime.   carbidopa-levodopa (SINEMET IR) 25-100 MG tablet 2 at 7am, 2 at 11am, 1 at 4pm (Patient taking differently: Take 1-2 tablets by mouth See admin instructions. 2 at 7am, 2 at 11am, 1 at 4pm)   docusate sodium (COLACE) 100 MG capsule Take 1 capsule (100 mg total) by mouth daily as needed for up to 30 doses.   glycopyrrolate (ROBINUL) 1 MG tablet Take 1 tablet (1 mg total) by mouth 2 (two) times daily.   hydrochlorothiazide (HYDRODIURIL) 25 MG tablet Take 25 mg by mouth every morning.    ibuprofen (ADVIL) 200 MG tablet Take 200 mg by mouth daily as needed for moderate pain.   losartan (COZAAR) 100 MG tablet Take 50 mg by mouth 2 (two) times daily.   oxyCODONE-acetaminophen (PERCOCET) 5-325 MG tablet Take 1 tablet by mouth every 4 (four) hours as needed for up to 12 doses for severe pain.   Polyvinyl Alcohol (  LIQUID TEARS OP) Place 1 drop into both eyes daily as needed (irritation).   Pramipexole Dihydrochloride 1.5 MG TB24 Take 1 tablet (1.5 mg total) by mouth at bedtime.   tamsulosin (FLOMAX) 0.4 MG CAPS capsule Take 0.4 mg by mouth at bedtime.   No facility-administered encounter medications on file as of 12/29/2022.    Objective:   PHYSICAL EXAMINATION:    VITALS:   Vitals:   12/29/22 0837  BP: (!) 160/70  Pulse: 62  SpO2: 98%  Weight: 216 lb 6.4 oz (98.2 kg)  Height: 6\' 2"  (1.88 m)    GEN:  The patient appears stated  age and is in NAD. HEENT:  Normocephalic, atraumatic.  The mucous membranes are moist. The superficial temporal arteries are without ropiness or tenderness. CV:  RRR Lungs:  CTAB Neck/HEME:  There are no carotid bruits bilaterally. MS:  he does have LE edema  Neurological examination:  Orientation: The patient is alert and oriented x3. Cranial nerves: There is good facial symmetry with facial hypomimia. The speech is fluent and clear. Soft palate rises symmetrically and there is no tongue deviation. Hearing is intact to conversational tone. Sensation: Sensation is intact to light touch throughout Motor: Strength is at least antigravity x4.  Movement examination: Tone: There is mild increased tone in the UE bilaterally, L>R (same as previous) Abnormal movements: none Coordination:  There is min decremation, with any form of RAMS, including alternating supination and pronation of the forearm, hand opening and closing, finger taps, heel taps and toe taps on the L Gait and Station: The patient ambulates well in the hall today Exam is similar to previous     Cc:  Chilton Greathouse, MD

## 2022-12-24 ENCOUNTER — Ambulatory Visit: Payer: No Typology Code available for payment source | Admitting: Occupational Therapy

## 2022-12-24 ENCOUNTER — Ambulatory Visit: Payer: No Typology Code available for payment source | Admitting: Speech Pathology

## 2022-12-24 ENCOUNTER — Ambulatory Visit: Payer: No Typology Code available for payment source | Admitting: Physical Therapy

## 2022-12-29 ENCOUNTER — Encounter: Payer: Self-pay | Admitting: Neurology

## 2022-12-29 ENCOUNTER — Telehealth: Payer: Self-pay

## 2022-12-29 ENCOUNTER — Other Ambulatory Visit: Payer: Self-pay

## 2022-12-29 ENCOUNTER — Ambulatory Visit (INDEPENDENT_AMBULATORY_CARE_PROVIDER_SITE_OTHER): Payer: No Typology Code available for payment source | Admitting: Neurology

## 2022-12-29 VITALS — BP 140/60 | HR 62 | Ht 74.0 in | Wt 216.4 lb

## 2022-12-29 DIAGNOSIS — G20A1 Parkinson's disease without dyskinesia, without mention of fluctuations: Secondary | ICD-10-CM

## 2022-12-29 DIAGNOSIS — R42 Dizziness and giddiness: Secondary | ICD-10-CM | POA: Diagnosis not present

## 2022-12-29 DIAGNOSIS — K117 Disturbances of salivary secretion: Secondary | ICD-10-CM

## 2022-12-29 NOTE — Telephone Encounter (Signed)
Dr Demetrius Charity fax number [phone]

## 2022-12-29 NOTE — Telephone Encounter (Signed)
error 

## 2022-12-29 NOTE — Patient Instructions (Signed)
Local and Online Resources for Power over Parkinson's Group  April 2024   LOCAL [place] PARKINSON'S GROUPS   Power over Parkinson's Group:    Power Over Parkinson's Patient Education Group will be Wednesday, April 10th-*Hybrid meting*- in person at [place] [facility] location and via WEBEX, 2:00-3:00 pm.   Power over Parkinson's and Care Partner Groups will meet together, with plans for separate break out session for caregivers, depending on topic/speaker Upcoming Power over Parkinson's Meetings/Care Partner Support:  2nd Wednesdays of the month at 2 pm:   April 10th, May 8th Contact Amy Marriott at [email or web address] if interested in participating in this group    LOCAL EVENTS AND NEW OFFERINGS  NEW:  Parkinson's Social Game Night.  First Thursday of each month, 2:00-4:00 pm.  *Next date is April 4th*.  [facility], [place].  Contact [email or web address] if interested. Parkinson's CarePartner Group for Men is in the works, if interested email [email or web address] ACT FITNESS Chair Yoga classes "Train and Gain", Fridays 10 am, [employer].  Contact Gina at [phone].  Community Fitness Instructor-Led Parkinson's Exercises Classes offering at [employer]!  TUESDAYS (Chair Yoga)  and Wednesdays (PWR! Moves)  1:00 pm.   Contact Christy Weaver at  [email or web address]  or [phone]  Drumming for Parkinson's will be held on 2nd and 4th Mondays at 11:00 am.   Located at the [employer] ([address] [place].)  Contact Jane Maydian at [email or web address] or [phone]  Dance for Parkinson 's classes will be on Tuesdays 10-11 am. Located in the [facility], in the first floor of the [facility] ([address] To register:  [email or web address] or [phone] [employer] Parkinson's Tai Chi Class, Mondays at 11 am.  Call [phone] for  details Hamil-Kerr Challenge.  Bike, Run, Walk Fundraiser for Parkinson's.  Saturday, April 6th at [place].  To register, visit [email or web address] Moving Day [place].  Saturday, May 4th, 10 am start.  Register at [email or web address]    ONLINE EDUCATION AND SUPPORT  Parkinson Foundation:  www.parkinson.org  PD Health at Home continues:  Mindfulness Mondays, Wellness Wednesdays, Fitness Fridays  (PWR! Moves as part of Fitness Fridays March 22nd, 1-1:45 pm) Upcoming Education:   Parkinson's 101.  Wednesday, April 3rd, 1-2 pm Movement for Parkinson's.  Wednesday, May 1st, 1-2 pm Expert Briefing:  Research Update:  Working to Halt PD.  Wednesday, April 10th, 1-2 pm Trouble with Zzz's:  Sleep Challenges with Parkinson's.  Wed, May 8th 1-2 pm Register for virtual education and expert briefings (webinars) at www.[email or web address].org/resources-support/[email or web address]-education Please check out their website to sign up for emails and see their full online offerings     Michael J Fox Foundation:  www.michaeljfox.org   Third Thursday Webinars:  On the third Thursday of every month at 12 p.m. ET, join our free live webinars to learn about various aspects of living with Parkinson's disease and our work to speed medical breakthroughs.  Upcoming Webinar:  Let's Talk Taboos:  Hard-to-Discuss Parkinson's Symptoms.  Thursday, April 18th at 12 noon. Check out additional information on their website to see their full online offerings    Davis Phinney Foundation:  www.davisphinneyfoundation.org  Upcoming Webinar:   Emergent Therapies.  Wednesday, April 2nd, 4 pm Series:  Living with Parkinson's Meetup.   Third Thursdays each month, 3 pm  Care Partner Monthly Meetup.  With Connie Carpenter Phinney.  First Tuesday of each month, 2 pm  Check out additional   information to Live Well Today on their website    Parkinson and Movement Disorders (PMD) Alliance:  www.pmdalliance.org  [employer]  Online:  Online Education Events  Sign up for emails, which are sent weekly to give you updates on programming and online offerings    Parkinson's Association of [place]:  www.parkinsonassociation.org  Information on online support groups, education events, and online exercises including Yoga, Parkinson's exercises and more-LOTS of information on links to PD resources and online events  Virtual Support Group through Parkinson's Association of [place]; next one is scheduled for Wednesday, April 3rd  MOVEMENT AND EXERCISE OPPORTUNITIES  PWR! Moves Classes at [place] Exercise [facility].  Wednesdays 10 and 11 am.   Contact Amy Marriott, PT [email or web address] if interested.  Parkinson's Exercise Class offerings at [employer]. *TUESDAYS* (Chair yoga) and Wednesdays (PWR! Moves)  1:00 pm.    Contact Christy Weaver at [email or web address]    Parkinson's Wellness Recovery (PWR! Moves)  www.pwr4life.org  Info on the PWR! Virtual Experience:  You will have access to our expertise?through self-assessment, guided plans that start with the PD-specific fundamentals, educational content, tips, Q&A with an expert, and a growing library of PD-specific pre-recorded and live exercise classes of varying types and intensity - both physical and cognitive! If that is not enough, we offer 1:1 wellness consultations (in-person or virtual) to personalize your PWR! Virtual Experience.   Parkinson Foundation Fitness Fridays:   As part of the PD Health @ Home program, this free video series focuses each week on one aspect of fitness designed to support people living with Parkinson's.? These weekly videos highlight the Parkinson Foundation fitness guidelines for people with Parkinson's disease.  www.parkinson.org/resources-support/online-education/pdhealth#ff  Dance for PD website is offering free, live-stream classes throughout the week, as well as links to digital library of classes:   [email or web address]/  Virtual dance and Pilates for Parkinson's classes: Click on the Community Tab> Parkinson's Movement Initiative Tab.  To register for classes and for more information, visit www.[email or web address] and click the "community" tab.   [employer] Parkinson's Cycling Classes   [employer]:  Thursdays @ Noon-Live classes at [employer] (Contact Margaret Hazen at [email or web address]?or [phone])  [employer]: Classes Tuesday, Wednesday and Thursday (contact Marlee at [email or web address] ?or [phone])  [place] [employer]  Varied levels of classes are offered Tuesdays and Thursdays at [employer] [facility].   Stretching with Maria weekly class is also offered for people with Parkinson's  To observe a class or for more information, call [phone] or email Hillary Savage at [email or web address]   ADDITIONAL SUPPORT AND RESOURCES  [employer]:  Online Caregiver Education Opportunities:  www.well-springsolutions.org/caregiver-education/caregiver-support-group.  You may also contact Jodi Kolada at [email or web address] or [phone].     [employer] April Offerings National HealthCare Decisions Day:  The Most Critical Legal and Medical Decisions to Consider Now!  Tuesday, April 16th, 1-3 pm at [facility] at [address].  Contact Jodi Kolada at [email or web address] or [phone] Powerful Tools for Caregivers.  6 week educational series for caregivers.  April 18-May 23, 10:30 am-12:15 pm at [facility] 3rd Floor Conference Room.   Contact Jodi Kolada at [email or web address] or [phone] to register Well[employer]Spring Navigator:  Just1Navigator program, a?free service to help individuals and families through the journey of determining care for older adults.  The "Navigator" is a social worker, Nicole Reynolds, who will speak with a prospective client and/or loved  ones to provide an assessment of the situation and a set of recommendations for a personalized care   plan -- all free of charge, and whether?[employer] offers the needed service or not. If the need is not a service we provide, we are well-connected with reputable programs in town that we can refer you to.  www.well-springsolutions.org or to speak with the Navigator, call [phone].     

## 2023-01-15 ENCOUNTER — Ambulatory Visit (INDEPENDENT_AMBULATORY_CARE_PROVIDER_SITE_OTHER): Payer: Medicare Other | Admitting: Neurology

## 2023-01-15 DIAGNOSIS — K117 Disturbances of salivary secretion: Secondary | ICD-10-CM | POA: Diagnosis not present

## 2023-01-15 MED ORDER — RIMABOTULINUMTOXINB 5000 UNIT/ML IM SOLN
5000.0000 [IU] | Freq: Once | INTRAMUSCULAR | Status: AC
  Administered 2023-01-15: 5000 [IU] via INTRAMUSCULAR

## 2023-01-15 NOTE — Procedures (Signed)
Botulinum Clinic    History:  Diagnosis: Sialorrhea    Result History  Working well  Consent obtained from: The patient The patient was educated on the botulinum toxin the black blox warning and given a copy of the botox patient medication guide.  The patient understands that this warning states that there have been reported cases of the Botox extending beyond the injection site and creating adverse effects, similar to those of botulism. This included loss of strength, trouble walking, hoarseness, trouble saying words clearly, loss of bladder control, trouble breathing, trouble swallowing, diplopia, blurry vision and ptosis. Most of the distant spread of Botox was happening in patients, primarily children, who received medication for spasticity or for cervical dystonia. The patient expressed understanding and desire to proceed.     Injections  Location Left  Right Units Number of sites  Submandibular gland 250 250 500 1 per side  Parotid 2250 2250 2500 1 per side  TOTAL UNITS:     5000      Type of Toxin: Myobloc type B As ordered and injected IM at today's visit Total Units: 5000  Discarded Units: 0  Needle drawback with each injection was free of blood. Pt tolerated procedure well without complications.   Reinjection is anticipated in 3 months.                  

## 2023-01-20 ENCOUNTER — Telehealth: Payer: Self-pay | Admitting: Neurology

## 2023-01-20 ENCOUNTER — Other Ambulatory Visit: Payer: Self-pay

## 2023-01-20 DIAGNOSIS — K117 Disturbances of salivary secretion: Secondary | ICD-10-CM

## 2023-01-20 MED ORDER — GLYCOPYRROLATE 1 MG PO TABS
1.0000 mg | ORAL_TABLET | Freq: Two times a day (BID) | ORAL | 1 refills | Status: DC
Start: 2023-01-20 — End: 2023-07-13

## 2023-01-20 MED ORDER — GLYCOPYRROLATE 1 MG PO TABS: 1.0000 mg | ORAL_TABLET | Freq: Two times a day (BID) | ORAL | 1 refills | Status: DC

## 2023-01-20 NOTE — Telephone Encounter (Signed)
Called patient he needs printed RX sent to [facility] pharmacy

## 2023-01-20 NOTE — Telephone Encounter (Signed)
Pt left a vm wanting to speak to Dr Tat nurse about a medication

## 2023-03-25 ENCOUNTER — Ambulatory Visit: Payer: No Typology Code available for payment source | Admitting: Physical Therapy

## 2023-03-25 ENCOUNTER — Ambulatory Visit: Payer: No Typology Code available for payment source | Admitting: Speech Pathology

## 2023-03-25 ENCOUNTER — Ambulatory Visit: Payer: No Typology Code available for payment source | Admitting: Occupational Therapy

## 2023-04-14 NOTE — Therapy (Signed)
[facility] [address] [place], [place], [address] Phone: [phone]   Fax:  [phone]  Patient Details  Name: Zachary Wang. MRN: [id number] Date of Birth: [birth year] Referring Provider:  Chilton Greathouse, MD  Encounter Date: 04/15/2023  Occupational Therapy Parkinson's Disease Screen  Hand dominance:  Rt handed   Physical Performance Test item #2 (simulated eating):  12.89 sec  Physical Performance Test item #4 (donning/doffing jacket):  13.66 sec  Fastening/unfastening 3 buttons in:  29.70 sec (7 sec slower than d/c)  9-hole peg test:    RUE  29.47 sec        LUE  48.48 sec (slower from last time, but also more limited d/t wrist fusion)   Box & Blocks Test:   RUE  46 blocks        LUE  48 blocks  Change in ability to perform ADLs/IADLs:  more difficulty with buttons  Other Comments:  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 (Will need [facility] approval)     Sheran Lawless, OT 04/14/2023, 3:06 PM  [facility] [address] [place], [place], [address] Phone: [phone]   Fax:  [phone]

## 2023-04-15 ENCOUNTER — Ambulatory Visit: Payer: No Typology Code available for payment source | Attending: Internal Medicine | Admitting: Occupational Therapy

## 2023-04-15 ENCOUNTER — Ambulatory Visit: Payer: No Typology Code available for payment source | Admitting: Physical Therapy

## 2023-04-15 ENCOUNTER — Ambulatory Visit: Payer: No Typology Code available for payment source | Admitting: Speech Pathology

## 2023-04-15 DIAGNOSIS — R29818 Other symptoms and signs involving the nervous system: Secondary | ICD-10-CM

## 2023-04-15 DIAGNOSIS — R278 Other lack of coordination: Secondary | ICD-10-CM | POA: Insufficient documentation

## 2023-04-15 NOTE — Therapy (Signed)
[facility] [address] [place], [place], [address] Phone: [phone]   Fax:  [phone]  Patient Details  Name: Zachary Wang. MRN: [id number] Date of Birth: 17-Aug-1941 Referring Provider:  Chilton Greathouse, MD  Encounter Date: 04/15/2023  Physical Therapy Parkinson's Disease Screen   Timed Up and Go test:7.5 seconds (previously 8.2 seconds)   10 meter walk test:8.9 seconds = 3.68 ft/sec (previously 8.7 seconds = 3.77 ft/sec)  5 time sit to stand test:10.5 seconds (previously 11.5 seconds)  Have been going to the PWR classes at [place]. Also doing tai chi, spin class at the [facility], playing golf. Balance is feeling ok. Sometimes can get up and stagger. No falls. Pt denies freezing episodes.   Patient would benefit from a short bout of Physical Therapy and an evaluation due to pt noticing changes in his balance.   Will need to request VA auth.     Drake Leach, PT, DPT 04/15/2023, 10:26 AM  [facility] [address] [place], [place], [address] Phone: [phone]   Fax:  [phone]

## 2023-04-16 ENCOUNTER — Ambulatory Visit (INDEPENDENT_AMBULATORY_CARE_PROVIDER_SITE_OTHER): Payer: No Typology Code available for payment source | Admitting: Neurology

## 2023-04-16 DIAGNOSIS — K117 Disturbances of salivary secretion: Secondary | ICD-10-CM | POA: Diagnosis not present

## 2023-04-16 MED ORDER — RIMABOTULINUMTOXINB 5000 UNIT/ML IM SOLN
5000.0000 [IU] | Freq: Once | INTRAMUSCULAR | Status: AC
Start: 2023-04-16 — End: 2023-04-16
  Administered 2023-04-16: 5000 [IU] via INTRAMUSCULAR

## 2023-04-16 NOTE — Procedures (Signed)
Botulinum Clinic    History:  Diagnosis: Sialorrhea    Result History  Working well.  Wears off "right at 3 months"  Consent obtained from: The patient The patient was educated on the botulinum toxin the black blox warning and given a copy of the botox patient medication guide.  The patient understands that this warning states that there have been reported cases of the Botox extending beyond the injection site and creating adverse effects, similar to those of botulism. This included loss of strength, trouble walking, hoarseness, trouble saying words clearly, loss of bladder control, trouble breathing, trouble swallowing, diplopia, blurry vision and ptosis. Most of the distant spread of Botox was happening in patients, primarily children, who received medication for spasticity or for cervical dystonia. The patient expressed understanding and desire to proceed.     Injections  Location Left  Right Units Number of sites  Submandibular gland 250 250 500 1 per side  Parotid 2250 2250 2500 1 per side  TOTAL UNITS:     5000      Type of Toxin: Myobloc type B As ordered and injected IM at today's visit Total Units: 5000  Discarded Units: 0  Needle drawback with each injection was free of blood. Pt tolerated procedure well without complications.   Reinjection is anticipated in 3 months.

## 2023-05-21 ENCOUNTER — Telehealth: Payer: Self-pay | Admitting: Neurology

## 2023-05-21 NOTE — Telephone Encounter (Signed)
Pt is supposed to be getting PT and OT. He is a [place] patient and the [facility] needs authorization in order for him to get his PT and OT. He cannot book his appointment until it is received. The VA fax number is [phone].

## 2023-05-25 NOTE — Telephone Encounter (Signed)
Called patient. Rehab has already sent in paperwork for OT and just wanting to know if he needs me to also send in paperwork

## 2023-06-09 ENCOUNTER — Ambulatory Visit: Payer: No Typology Code available for payment source | Attending: Internal Medicine | Admitting: Occupational Therapy

## 2023-06-09 DIAGNOSIS — R293 Abnormal posture: Secondary | ICD-10-CM | POA: Insufficient documentation

## 2023-06-09 DIAGNOSIS — R278 Other lack of coordination: Secondary | ICD-10-CM | POA: Diagnosis present

## 2023-06-09 DIAGNOSIS — R251 Tremor, unspecified: Secondary | ICD-10-CM | POA: Diagnosis present

## 2023-06-09 DIAGNOSIS — R2681 Unsteadiness on feet: Secondary | ICD-10-CM | POA: Diagnosis present

## 2023-06-09 DIAGNOSIS — R29898 Other symptoms and signs involving the musculoskeletal system: Secondary | ICD-10-CM | POA: Diagnosis present

## 2023-06-09 DIAGNOSIS — R29818 Other symptoms and signs involving the nervous system: Secondary | ICD-10-CM | POA: Insufficient documentation

## 2023-06-09 NOTE — Therapy (Signed)
OUTPATIENT OCCUPATIONAL THERAPY PARKINSON'S EVALUATION  Patient Name: Zachary Wang. MRN: [id number] DOB:22-Jun-1941, 82 y.o., male Today's Date: 06/09/2023  PCP: Chilton Greathouse REFERRING PROVIDER: Lenice Pressman, MD (from [place]) - will send POC to PCP  END OF SESSION:  OT End of Session - 06/09/23 0948     Visit Number 1    Number of Visits 13    Date for OT Re-Evaluation 07/30/23    Authorization Type [facility] - approved 06/02/23 - 09/30/23    OT Start Time 0802    OT Stop Time 0845    OT Time Calculation (min) 43 min    Activity Tolerance Patient tolerated treatment well    Behavior During Therapy [facility] for tasks assessed/performed             Past Medical History:  Diagnosis Date   Arthritis    hands   BPH (benign prostatic hyperplasia)    Cancer (HCC)    skin cancer   Elevated PSA    Foley catheter in place    History of adenomatous polyp of colon    2014  tubular adenoma   History of malignant melanoma    s/p  excision left forearm and left axill lymph node bx 09-10-2004   Hyperlipidemia    Hypertension    Peripheral vascular disease (HCC)    Urinary retention    Past Surgical History:  Procedure Laterality Date   ANAL FISSURE REPAIR  08/17/1982   COLONOSCOPY N/A 05/02/2013   Procedure: COLONOSCOPY;  Surgeon: Charolett Bumpers, MD;  Location: [facility];  Service: Endoscopy;  Laterality: N/A;   INSERTION OF SUPRAPUBIC CATHETER N/A 09/30/2015   Procedure: INSERTION OF SUPRAPUBIC CATHETER;  Surgeon: Ihor Gully, MD;  Location: [facility];  Service: Urology;  Laterality: N/A;   SHOULDER ARTHROSCOPY W/ SUBACROMIAL DECOMPRESSION AND DISTAL CLAVICLE EXCISION Left 05/20/2010   w/  Debridement , Bursectomy, Acromioplasty,  Capsulectomy   TONSILLECTOMY     TRANSURETHRAL RESECTION OF PROSTATE N/A 09/30/2015   Procedure: TURP (TRANSURETHRAL RESECTION OF PROSTATE WITH GYRUS;  Surgeon: Ihor Gully, MD;  Location: [facility];  Service:  Urology;  Laterality: N/A;   TRANSURETHRAL RESECTION OF PROSTATE N/A 09/21/2022   Procedure: BIPOLAR TRANSURETHRAL RESECTION OF THE PROSTATE (TURP);  Surgeon: Jannifer Hick, MD;  Location: [facility];  Service: Urology;  Laterality: N/A;  75 MINUTES NEEDED FOR CASE   WIDE EXCISION MALIGNANT MELANOMA LEFT FOREARM/  LEFT AXILLA LYMPH NODE BX  09/10/2004   Patient Active Problem List   Diagnosis Date Noted   BPH (benign prostatic hyperplasia) 09/21/2022   Parkinson's disease (HCC) 04/13/2017   Benign prostatic hyperplasia with urinary retention 09/30/2015    ONSET DATE: 05/20/2023  REFERRING DIAG: G20.A1 (ICD-10-CM) - Parkinson's disease (HCC)  THERAPY DIAG:  Other lack of coordination  Other symptoms and signs involving the nervous system  Tremor  Other symptoms and signs involving the musculoskeletal system  Abnormal posture  Unsteadiness on feet  Rationale for Evaluation and Treatment: Rehabilitation  SUBJECTIVE:   SUBJECTIVE STATEMENT: They only ordered P.T.  Pt accompanied by: self  PERTINENT HISTORY: PD (Diagnosed 2018), HTN, HLD, OA, h/o melanoma, PVD  PRECAUTIONS: None  WEIGHT BEARING RESTRICTIONS: No  PAIN:  Are you having pain? No  FALLS: Has patient fallen in last 6 months? No, but stumbles  LIVING ENVIRONMENT: Lives with: lives with their spouse Lives in: 2 story home, bedroom/full bath on first floor. 3 steps to enter Has following equipment at home:  Grab bars  PLOF: Independent with basic ADLs  PATIENT GOALS: work on buttons and balance  OBJECTIVE:  Note: Objective measures were completed at Evaluation unless otherwise noted.  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, 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 Handwriting: 100% legible for short sentence in cursive, Mild micrographia reported at times   MOBILITY STATUS: Independent  POSTURE COMMENTS:  rounded shoulders and forward head   FUNCTIONAL OUTCOME MEASURES: Fastening/unfastening 3 buttons: 43 sec (29.70 sec at screen on 04/15/23) Physical performance test: PPT#2 (simulated eating) 13.05 & PPT#4 (donning/doffing jacket): 11.12 sec  but does report getting second arm stuck at times which takes longer  COORDINATION: 9 Hole Peg test: Right: 26.80 sec; Left: 48.79 sec (slower than previous admission, but also d/t wrist OA on Lt) Box and Blocks:  Right 50 blocks, Left 44 blocks (noted mild tremor Lt hand) Tremors: Right, Left, and mostly happens when excited/stressed/rushed  UE ROM:  WFL and except Lt wrist limited d/t OA  SENSATION: WFL   COGNITION: Overall cognitive status:  Word finding difficulties reported  OBSERVATIONS: Bradykinesia and Hypokinesia Pt does PWR! Moves class, Tai Chi, and cycling in community   TODAY'S TREATMENT:                                                                                                                               N/A today   PATIENT EDUCATION: Education details: OT POC Person educated: Patient Education method: Explanation Education comprehension: verbalized understanding  HOME EXERCISE PROGRAM: N/A today  GOALS: Goals reviewed with patient? Yes  SHORT TERM GOALS: Target date: 07/02/23  Independent with updated coordination HEP  Baseline: Goal status: INITIAL  2.  Pt will verbalize understanding of  adaptive strategies to increase ease with ADLS/IADLS  (especially re: buttons and jacket)  Baseline:  Goal status: INITIAL  3.  Pt will verbalize understanding of ways to keep thinking skills sharp and ways to compensate for STM changes in the future  Baseline:  Goal status: INITIAL  4.  Pt will verbalize understanding of ways to prevent future PD related complications and appropriate community resources prn  Baseline:  Goal status: INITIAL   LONG TERM GOALS: Target date: 07/30/23  Pt will demonstrate improved ease with fastening buttons as evidenced by decreasing 3 button/unbutton time by 10 seconds or more Baseline: 43 sec Goal  status: INITIAL  2.  Pt will demonstrate improved fine motor coordination for ADLs as evidenced by decreasing 9 hole peg test score for Lt hand  by 5 secs or greater Baseline: 48.79 sec Goal status: INITIAL  3.  Pt will write a paragraph with no significant decrease in size and maintain 100% legibility  Baseline:  Goal status: INITIAL  ASSESSMENT:  CLINICAL IMPRESSION: Patient is a 82 y.o. male who was seen today for occupational therapy evaluation for Parkinson's disease. Pt familiar to this clinic and was last seen for O.T. screen on 04/15/23. Pt would benefit from O.T. to work on coordination, bradykinesia, hypokinesia, and develop PD specific HEP, and prevent future related PD complications  PERFORMANCE DEFICITS: in functional skills including ADLs, IADLs, coordination, dexterity, ROM, Fine motor control, mobility, balance, body mechanics, decreased knowledge of use of DME, vision, and UE functional use, cognitive skills including memory, problem solving, and divided attention .   IMPAIRMENTS: are limiting patient from ADLs, IADLs, and leisure.   COMORBIDITIES:  may have co-morbidities  that affects occupational performance. Patient will benefit from skilled OT to address above impairments and improve overall function.  MODIFICATION OR ASSISTANCE TO  COMPLETE EVALUATION: No modification of tasks or assist necessary to complete an evaluation.  OT OCCUPATIONAL PROFILE AND HISTORY: Problem focused assessment: Including review of records relating to presenting problem.  CLINICAL DECISION MAKING: Moderate - several treatment options, min-mod task modification necessary  REHAB POTENTIAL: Good  EVALUATION COMPLEXITY: Low    PLAN:  OT FREQUENCY: 2x/week  OT DURATION: 6 weeks (date extended d/t scheduling conflicts)   PLANNED INTERVENTIONS: 97535 self care/ADL training, 40981 therapeutic exercise, 97530 therapeutic activity, 97112 neuromuscular re-education, 97140 manual therapy, 97039 fluidotherapy, 97760 Orthotics management and training, 19147 Splinting (initial encounter), passive range of motion, functional mobility training, visual/perceptual remediation/compensation, coping strategies training, patient/family education, and DME and/or AE instructions  RECOMMENDED OTHER SERVICES: none at this time  CONSULTED AND AGREED WITH PLAN OF CARE: Patient  PLAN FOR NEXT SESSION: show strategies for buttons and jacket, updated coordination HEP    Sheran Lawless, OT 06/09/2023, 9:49 AM

## 2023-06-14 ENCOUNTER — Ambulatory Visit: Payer: No Typology Code available for payment source | Admitting: Occupational Therapy

## 2023-06-17 ENCOUNTER — Ambulatory Visit: Payer: No Typology Code available for payment source | Admitting: Physical Therapy

## 2023-06-17 ENCOUNTER — Ambulatory Visit: Payer: No Typology Code available for payment source | Admitting: Occupational Therapy

## 2023-06-17 DIAGNOSIS — R278 Other lack of coordination: Secondary | ICD-10-CM

## 2023-06-17 DIAGNOSIS — R251 Tremor, unspecified: Secondary | ICD-10-CM

## 2023-06-17 DIAGNOSIS — R29818 Other symptoms and signs involving the nervous system: Secondary | ICD-10-CM

## 2023-06-17 DIAGNOSIS — R29898 Other symptoms and signs involving the musculoskeletal system: Secondary | ICD-10-CM

## 2023-06-17 NOTE — Patient Instructions (Addendum)
Performing Daily Activities with Big Movements  Pick at least 2 activities a day and perform with BIG, DELIBERATE movements/effort.  Try different activities each day. This can make the activity easier and turn daily activities into exercise to prevent problems in the future!  If you are standing during the activity, make sure to keep feet apart and stand with good/big/posture.  Examples: Dressing  Pull-over shirt:  good posture, bring shirt to head (don't bring head down), deliberately push arms into sleeves Jacket:  stand with feet apart, twist when putting on jacket, deliberate push arms into sleeves Underwear/Pants:  Sit, lean forward, and push foot into pants deliberately Open hands to pull down shirt/put on socks/pull up pants--get more material in your hand  Buttoning - Open hands big before fastening each button, deliberate movement (angry buttons--push button through hole), unfasten by using pull-push method   Bathing - Wash/dry with long strokes  Brushing your teeth - Big, slow movements  Cutting food - Long deliberate cuts, put tip of knife down in front of food  Eating - Hold utensil in the middle (not the end), hold fork straight up and down to stab food  Picking up a cup/bottle - Open hand up big and get object all the way in palm  Opening jar/bottle - Move as much as you can with each turn, twist wrist  Putting on seatbelt - Feet apart, twist when reaching, look at where you are reaching  Hanging up clothes/getting clothes down from closet - Reach with big effort, open hand, straighten elbow  Putting away groceries/dishes - Reach with big effort  Wiping counter/table - Move in big, long strokes, open hand  Stirring while cooking - Exaggerate movement  Cleaning windows - Feet apart, move in big, long strokes  Sweeping/Raking- Feet apart and one foot in front of the other, move arms in big, long strokes  Vacuuming - Feet apart and one in front of the other, push  with big movement  Folding clothes - Exaggerate arm movements  Washing car - Move in big, long strokes, feet apart  Changing light bulb or Using a screwdriver - Move as much as you can with each turn, twist wrist  Walking into a store/restaurant - Walk with big steps, good posture, swing arms if able  Standing up from a chair/recliner/sofa - Scoot forward, lean forward, and stand with big effort  Picking up something from floor/reaching in low cabinet - Get close to object, Position feet apart and one in front of the other.   PWR! Hand Exercises Perform each exercise at least 10 repetitions 1x/day, and PWR! PUSH throughout the day when you are having trouble using your hands (picking up small objects, writing, eating, typing, buttoning, etc.). ** Make each movement big and deliberate; feel the movement.  PWR! UP: Fists to open fingers BIG  PWR! Rock:  Move wrists up and down [employer]! Twist: Twist palms up and down BIG  PWR! Step: Step your thumb to index finger while keeping other fingers straight. Flick fingers out BIG (thumb out/straighten fingers). Repeat with other fingers.  PWR! PUSH: Push hands out BIG. Elbows straight, wrists up, fingers open and spread apart BIG.

## 2023-06-22 ENCOUNTER — Encounter: Payer: Self-pay | Admitting: Occupational Therapy

## 2023-06-22 ENCOUNTER — Ambulatory Visit: Payer: No Typology Code available for payment source | Attending: Internal Medicine | Admitting: Occupational Therapy

## 2023-06-22 DIAGNOSIS — R29898 Other symptoms and signs involving the musculoskeletal system: Secondary | ICD-10-CM | POA: Insufficient documentation

## 2023-06-22 DIAGNOSIS — R2681 Unsteadiness on feet: Secondary | ICD-10-CM | POA: Diagnosis present

## 2023-06-22 DIAGNOSIS — R41840 Attention and concentration deficit: Secondary | ICD-10-CM | POA: Insufficient documentation

## 2023-06-22 DIAGNOSIS — R29818 Other symptoms and signs involving the nervous system: Secondary | ICD-10-CM | POA: Diagnosis present

## 2023-06-22 DIAGNOSIS — R278 Other lack of coordination: Secondary | ICD-10-CM | POA: Diagnosis present

## 2023-06-22 DIAGNOSIS — R251 Tremor, unspecified: Secondary | ICD-10-CM | POA: Diagnosis present

## 2023-06-22 NOTE — Patient Instructions (Addendum)
Compensation Strategies for Tremors  When eating, try the following Eat out of bowls, divided plates, or use a plate guard (available at a medical supply store) and eat with a spoon so that you have an edge to scoop up food. Try raising your plate so that there is less distance between the plate and mouth.Try stabilizing elbows on the tables or against your body. Use utensil with built-up/larger grips as they are easier to hold.  When writing, try the following: Stabilize forearm on the table. Take your time as rushing/being stressed can increase tremors. Try a felt-tipped pen, it does not glide as much.  Avoid gel pens ( they move to much ). Consider using pre-printed labels with your name and address (carry them with you when you go out) or you can get stamps with your address or signature on it. Use a small tape recorder to record messages/reminders for yourself. Use pens with bigger grips.  When brushing your teeth, putting on make-up, or styling hair, try the following: Use an electric toothbrush. Use items with built-up grips. Stabilize your elbows against your body or on the counter. Use long-handled brushes/combs. Use a hair dryer with a stand.  In general: Avoid stress, fatigue or rushing as this can increase tremors. Sit down for activities that require more control/coordination. Perform "flicks".  

## 2023-06-22 NOTE — Therapy (Signed)
OUTPATIENT OCCUPATIONAL THERAPY PARKINSON'S TREATMENT  Patient Name: Zachary Wang. MRN: [id number] DOB:[birth year], 82 y.o., male Today's Date: 06/22/2023  PCP: Chilton Greathouse REFERRING PROVIDER: Lenice Pressman, MD (from [place]) - will send POC to PCP  END OF SESSION:  OT End of Session - 06/22/23 1022     Visit Number 3    Number of Visits 13    Date for OT Re-Evaluation 07/30/23    Authorization Type VA - approved 06/02/23 - 09/30/23    OT Start Time 1020    OT Stop Time 1100    OT Time Calculation (min) 40 min    Activity Tolerance Patient tolerated treatment well    Behavior During Therapy [facility] for tasks assessed/performed             Past Medical History:  Diagnosis Date   Arthritis    hands   BPH (benign prostatic hyperplasia)    Cancer (HCC)    skin cancer   Elevated PSA    Foley catheter in place    History of adenomatous polyp of colon    2014  tubular adenoma   History of malignant melanoma    s/p  excision left forearm and left axill lymph node bx 09-10-2004   Hyperlipidemia    Hypertension    Peripheral vascular disease (HCC)    Urinary retention    Past Surgical History:  Procedure Laterality Date   ANAL FISSURE REPAIR  08/17/1982   COLONOSCOPY N/A 05/02/2013   Procedure: COLONOSCOPY;  Surgeon: Charolett Bumpers, MD;  Location: [facility];  Service: Endoscopy;  Laterality: N/A;   INSERTION OF SUPRAPUBIC CATHETER N/A 09/30/2015   Procedure: INSERTION OF SUPRAPUBIC CATHETER;  Surgeon: Ihor Gully, MD;  Location: [facility];  Service: Urology;  Laterality: N/A;   SHOULDER ARTHROSCOPY W/ SUBACROMIAL DECOMPRESSION AND DISTAL CLAVICLE EXCISION Left 05/20/2010   w/  Debridement , Bursectomy, Acromioplasty,  Capsulectomy   TONSILLECTOMY     TRANSURETHRAL RESECTION OF PROSTATE N/A 09/30/2015   Procedure: TURP (TRANSURETHRAL RESECTION OF PROSTATE WITH GYRUS;  Surgeon: Ihor Gully, MD;  Location: [facility] [place];  Service:  Urology;  Laterality: N/A;   TRANSURETHRAL RESECTION OF PROSTATE N/A 09/21/2022   Procedure: BIPOLAR TRANSURETHRAL RESECTION OF THE PROSTATE (TURP);  Surgeon: Jannifer Hick, MD;  Location: [facility];  Service: Urology;  Laterality: N/A;  75 MINUTES NEEDED FOR CASE   WIDE EXCISION MALIGNANT MELANOMA LEFT FOREARM/  LEFT AXILLA LYMPH NODE BX  09/10/2004   Patient Active Problem List   Diagnosis Date Noted   BPH (benign prostatic hyperplasia) 09/21/2022   Parkinson's disease (HCC) 04/13/2017   Benign prostatic hyperplasia with urinary retention 09/30/2015    ONSET DATE: 05/20/2023  REFERRING DIAG: G20.A1 (ICD-10-CM) - Parkinson's disease (HCC)  THERAPY DIAG:  Other lack of coordination  Other symptoms and signs involving the nervous system  Tremor  Other symptoms and signs involving the musculoskeletal system  Unsteadiness on feet  Rationale for Evaluation and Treatment: Rehabilitation  SUBJECTIVE:   SUBJECTIVE STATEMENT: No pain. Buttons give me trouble  Pt accompanied by: self  PERTINENT HISTORY: PD (Diagnosed 2018), HTN, HLD, OA, h/o melanoma, PVD  PRECAUTIONS: None  WEIGHT BEARING RESTRICTIONS: No  PAIN:  Are you having pain? No  FALLS: Has patient fallen in last 6 months? No, but stumbles  LIVING ENVIRONMENT: Lives with: lives with their spouse Lives in: 2 story home, bedroom/full bath on first floor. 3 steps to enter Has following equipment at home: Grab  bars  PLOF: Independent with basic ADLs  PATIENT GOALS: work on buttons and balance  OBJECTIVE:  Note: Objective measures were completed at Evaluation unless otherwise noted.  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, 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 Handwriting: 100% legible for short sentence in cursive, Mild micrographia reported at times   MOBILITY STATUS: Independent  POSTURE COMMENTS:  rounded shoulders and forward head   FUNCTIONAL OUTCOME MEASURES: Fastening/unfastening 3 buttons: 43 sec (29.70 sec at screen on 04/15/23) Physical performance test: PPT#2 (simulated eating) 13.05 & PPT#4 (donning/doffing jacket): 11.12 sec  but does report getting second arm stuck at times which takes longer  COORDINATION: 9 Hole Peg test: Right: 26.80 sec; Left: 48.79 sec (slower than previous admission, but also d/t wrist OA on Lt) Box and Blocks:  Right 50 blocks, Left 44 blocks (noted mild tremor Lt hand) Tremors: Right, Left, and mostly happens when excited/stressed/rushed  UE ROM:  WFL and except Lt wrist limited d/t OA  SENSATION: WFL   COGNITION: Overall cognitive status:  Word finding difficulties reported  OBSERVATIONS: Bradykinesia and Hypokinesia Pt does PWR! Moves class, Tai Chi, and cycling in community   TODAY'S TREATMENT:                                                                                                                               Reviewed PWR! Hands each with cues to fully extend elbows w/ wrist extension in PWR! Rock and fully supinate for [employer]! Twist. Pt also had a hard time extending other fingers with PWR! Step.   Reviewed strategies for buttons and demo.  Discussed strategies for tremors especially with eating and scooping out coffee into mug and provided handout  Began organizing handouts however pt has not purchased 3 ring binder. Spent time  trying to reissue previous handouts because pt could not find them, but then therapist helped find them.    PATIENT EDUCATION: Education details: Review of  PWR! Hands; BIG ADL strategies Person educated: Patient Education method: Explanation, Demonstration, Verbal cues, and Handouts Education comprehension: verbalized understanding, returned demonstration, and needs further education  HOME EXERCISE PROGRAM: 10/31: PWR! Hands and BIG ADL strategies  GOALS: Goals reviewed with patient? Yes  SHORT TERM GOALS: Target date: 07/02/23  Independent with updated coordination HEP  Baseline: Goal status: INITIAL  2.  Pt will verbalize  understanding of adaptive strategies to increase ease with ADLS/IADLS  (especially re: buttons and jacket)  Baseline:  Goal status: IN PROGRESS  3.  Pt will verbalize understanding of ways to keep thinking skills sharp and ways to compensate for STM changes in the future  Baseline:  Goal status: INITIAL  4.  Pt will verbalize understanding of ways to prevent future PD related complications and appropriate community resources prn  Baseline:  Goal status: INITIAL   LONG TERM GOALS: Target date: 07/30/23  Pt will demonstrate improved ease with fastening buttons as evidenced by decreasing 3 button/unbutton time by 10 seconds or more Baseline: 43 sec Goal status: INITIAL  2.  Pt will demonstrate improved fine motor coordination for ADLs as evidenced by decreasing 9 hole peg test score for Lt hand  by 5 secs or greater Baseline: 48.79 sec Goal status: INITIAL  3.  Pt will write a paragraph with no significant decrease in size and maintain 100% legibility  Baseline:  Goal status: INITIAL  ASSESSMENT:  CLINICAL IMPRESSION: Patient verbalizes and demonstrates good understanding of PD specific strategies as needed to progress towards goals. Will likely be ready for d/c at next visit.   PERFORMANCE DEFICITS: in functional skills including ADLs, IADLs,  coordination, dexterity, ROM, Fine motor control, mobility, balance, body mechanics, decreased knowledge of use of DME, vision, and UE functional use, cognitive skills including memory, problem solving, and divided attention .   IMPAIRMENTS: are limiting patient from ADLs, IADLs, and leisure.   COMORBIDITIES:  may have co-morbidities  that affects occupational performance. Patient will benefit from skilled OT to address above impairments and improve overall function.  REHAB POTENTIAL: Good  PLAN:  OT FREQUENCY: 2x/week  OT DURATION: 6 weeks (date extended d/t scheduling conflicts)   PLANNED INTERVENTIONS: 97535 self care/ADL training, 08657 therapeutic exercise, 97530 therapeutic activity, 97112 neuromuscular re-education, 97140 manual therapy, 97039 fluidotherapy, 97760 Orthotics management and training, 84696 Splinting (initial encounter), passive range of motion, functional mobility training, visual/perceptual remediation/compensation, coping strategies training, patient/family education, and DME and/or AE instructions  RECOMMENDED OTHER SERVICES: none at this time  CONSULTED AND AGREED WITH PLAN OF CARE: Patient  PLAN FOR NEXT SESSION: update coordination HEP then discard old coordination HEPs, help pt begin organizing 3 ring binder if pt brings in (HEP's hole punched for center rings, education in side pockets).     Sheran Lawless, OT 06/22/2023, 10:23 AM

## 2023-06-24 ENCOUNTER — Ambulatory Visit: Payer: No Typology Code available for payment source | Admitting: Occupational Therapy

## 2023-06-24 DIAGNOSIS — R278 Other lack of coordination: Secondary | ICD-10-CM

## 2023-06-24 DIAGNOSIS — R41840 Attention and concentration deficit: Secondary | ICD-10-CM

## 2023-06-24 NOTE — Therapy (Signed)
OUTPATIENT OCCUPATIONAL THERAPY [facility]  Patient Name: Zachary Wang. MRN: [id number] DOB:07-31-1941, 82 y.o., male Today's Date: 06/24/2023  PCP: Chilton Greathouse REFERRING PROVIDER: Lenice Pressman, MD (from [place]) - will send POC to PCP  END OF SESSION:  OT End of Session - 06/24/23 1214     Visit Number 4    Number of Visits 13    Date for OT Re-Evaluation 07/30/23    Authorization Type [place] - approved 06/02/23 - 09/30/23    OT Start Time 0930    OT Stop Time 1015    OT Time Calculation (min) 45 min    Activity Tolerance Patient tolerated treatment well    Behavior During Therapy [facility] for tasks assessed/performed              Past Medical History:  Diagnosis Date   Arthritis    hands   BPH (benign prostatic hyperplasia)    Cancer (HCC)    skin cancer   Elevated PSA    Foley catheter in place    History of adenomatous polyp of colon    2014  tubular adenoma   History of malignant melanoma    s/p  excision left forearm and left axill lymph node bx 09-10-2004   Hyperlipidemia    Hypertension    Peripheral vascular disease (HCC)    Urinary retention    Past Surgical History:  Procedure Laterality Date   ANAL FISSURE REPAIR  08/17/1982   COLONOSCOPY N/A 05/02/2013   Procedure: COLONOSCOPY;  Surgeon: Charolett Bumpers, MD;  Location: [facility];  Service: Endoscopy;  Laterality: N/A;   INSERTION OF SUPRAPUBIC CATHETER N/A 09/30/2015   Procedure: INSERTION OF SUPRAPUBIC CATHETER;  Surgeon: Ihor Gully, MD;  Location: [facility];  Service: Urology;  Laterality: N/A;   SHOULDER ARTHROSCOPY W/ SUBACROMIAL DECOMPRESSION AND DISTAL CLAVICLE EXCISION Left 05/20/2010   w/  Debridement , Bursectomy, Acromioplasty,  Capsulectomy   TONSILLECTOMY     TRANSURETHRAL RESECTION OF PROSTATE N/A 09/30/2015   Procedure: TURP (TRANSURETHRAL RESECTION OF PROSTATE WITH GYRUS;  Surgeon: Ihor Gully, MD;  Location: [facility];  Service:  Urology;  Laterality: N/A;   TRANSURETHRAL RESECTION OF PROSTATE N/A 09/21/2022   Procedure: BIPOLAR TRANSURETHRAL RESECTION OF THE PROSTATE (TURP);  Surgeon: Jannifer Hick, MD;  Location: [facility];  Service: Urology;  Laterality: N/A;  75 MINUTES NEEDED FOR CASE   WIDE EXCISION MALIGNANT MELANOMA LEFT FOREARM/  LEFT AXILLA LYMPH NODE BX  09/10/2004   Patient Active Problem List   Diagnosis Date Noted   BPH (benign prostatic hyperplasia) 09/21/2022   Parkinson's disease (HCC) 04/13/2017   Benign prostatic hyperplasia with urinary retention 09/30/2015    ONSET DATE: 05/20/2023  REFERRING DIAG: G20.A1 (ICD-10-CM) - Parkinson's disease (HCC)  THERAPY DIAG:  Other lack of coordination  Attention and concentration deficit  Rationale for Evaluation and Treatment: Rehabilitation  SUBJECTIVE:   SUBJECTIVE STATEMENT: No pain. I brought in my new binder  Pt accompanied by: self  PERTINENT HISTORY: PD (Diagnosed 2018), HTN, HLD, OA, h/o melanoma, PVD  PRECAUTIONS: None  WEIGHT BEARING RESTRICTIONS: No  PAIN:  Are you having pain? No  FALLS: Has patient fallen in last 6 months? No, but stumbles  LIVING ENVIRONMENT: Lives with: lives with their spouse Lives in: 2 story home, bedroom/full bath on first floor. 3 steps to enter Has following equipment at home: Grab bars  PLOF: Independent with basic ADLs  PATIENT GOALS: work on buttons and balance  OBJECTIVE:  Note: Objective measures were completed at Evaluation unless otherwise noted.  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, 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 Handwriting: 100% legible for short sentence in cursive, Mild micrographia reported at times   MOBILITY STATUS: Independent  POSTURE COMMENTS:  rounded shoulders and forward head   FUNCTIONAL OUTCOME MEASURES: Fastening/unfastening 3 buttons: 43 sec (29.70 sec at screen on 04/15/23) Physical performance test: PPT#2 (simulated eating) 13.05 & PPT#4 (donning/doffing jacket): 11.12 sec  but does report getting second arm stuck at times which takes longer  COORDINATION: 9 Hole Peg test: Right: 26.80 sec; Left: 48.79 sec (slower than previous admission, but also d/t wrist OA on Lt) Box and Blocks:  Right 50 blocks, Left 44 blocks (noted mild tremor Lt hand) Tremors: Right, Left, and mostly happens when excited/stressed/rushed  UE ROM:  WFL and except Lt wrist limited d/t OA  SENSATION: WFL   COGNITION: Overall cognitive status:  Word finding difficulties reported  OBSERVATIONS: Bradykinesia and Hypokinesia Pt does PWR! Moves class, Tai Chi, and cycling in community   TODAY'S TREATMENT:                                                                                                                               Pt brought in binder and began organizing binder for patient with all new and previous relevant HEP's.   Issued updated coordination HEP (see pt instructions) and discarded old/duplicate coordination HEP's.    PATIENT EDUCATION: Education details: updated coordination HEP Person educated: Patient Education method: Explanation, Demonstration, Verbal cues, and Handouts Education comprehension: verbalized understanding, returned demonstration, and needs further education  HOME EXERCISE PROGRAM: 10/31: PWR! Hands and BIG ADL  strategies 06/22/23: Strategies for tremors 06/24/23: updated coordination HEP   GOALS: Goals reviewed with patient? Yes  SHORT TERM GOALS: Target date: 07/02/23  Independent with updated coordination HEP  Baseline: Goal status: IN PROGRESS  2.  Pt will verbalize understanding of adaptive strategies to increase ease with ADLS/IADLS  (especially re: buttons and jacket)  Baseline:  Goal status: IN PROGRESS  3.  Pt will verbalize understanding of ways to keep thinking skills sharp and ways to compensate for STM changes in the future  Baseline:  Goal status: INITIAL  4.  Pt will verbalize understanding of ways to prevent future PD related complications and appropriate community  resources prn  Baseline:  Goal status: INITIAL   LONG TERM GOALS: Target date: 07/30/23  Pt will demonstrate improved ease with fastening buttons as evidenced by decreasing 3 button/unbutton time by 10 seconds or more Baseline: 43 sec Goal status: INITIAL  2.  Pt will demonstrate improved fine motor coordination for ADLs as evidenced by decreasing 9 hole peg test score for Lt hand  by 5 secs or greater Baseline: 48.79 sec Goal status: INITIAL  3.  Pt will write a paragraph with no significant decrease in size and maintain 100% legibility  Baseline:  Goal status: INITIAL  ASSESSMENT:  CLINICAL IMPRESSION: Patient verbalizes and demonstrates good understanding of PD specific strategies as needed to progress towards goals. Will likely be ready for d/c at next visit.   PERFORMANCE DEFICITS: in functional skills including ADLs, IADLs, coordination, dexterity, ROM, Fine motor control, mobility, balance, body mechanics, decreased knowledge of use of DME, vision, and UE functional use, cognitive skills including memory, problem solving, and divided attention .   IMPAIRMENTS: are limiting patient from ADLs, IADLs, and leisure.   COMORBIDITIES:  may have co-morbidities  that affects occupational performance.  Patient will benefit from skilled OT to address above impairments and improve overall function.  REHAB POTENTIAL: Good  PLAN:  OT FREQUENCY: 2x/week  OT DURATION: 6 weeks (date extended d/t scheduling conflicts)   PLANNED INTERVENTIONS: 97535 self care/ADL training, 96045 therapeutic exercise, 97530 therapeutic activity, 97112 neuromuscular re-education, 97140 manual therapy, 97039 fluidotherapy, 97760 Orthotics management and training, 40981 Splinting (initial encounter), passive range of motion, functional mobility training, visual/perceptual remediation/compensation, coping strategies training, patient/family education, and DME and/or AE instructions  RECOMMENDED OTHER SERVICES: none at this time  CONSULTED AND AGREED WITH PLAN OF CARE: Patient  PLAN FOR NEXT SESSION: PD ex chart, ways to prevent future PD complications, handwriting strategies, Nov community resources     Dorris Fetch [place], [place] 06/24/2023, 12:14 PM

## 2023-06-24 NOTE — Patient Instructions (Signed)
Coordination Exercises  Perform the following exercises for 20 minutes 1 times per day. Perform with both hand(s). Perform using big movements.  Flipping Cards: Place deck of cards on the table. Flip cards over by opening your hand big to grasp and then turn your palm up big.  Deal cards: Hold 1/2 or whole deck in your hand. Use thumb to push card off top of deck with one big push.  Flip card between each finger.  Place card on table, then flick fingers (extend fingers) powerfully to slide card off table (can have chair/box below table to catch the cards)   Rotate ball with fingertips: Pick up with fingers/thumb and move as much as you can with each turn/movement (clockwise and counter-clockwise).  Toss ball in the air and catch with the same hand: Toss big/high.  Juggle 2 balls: Do not go fast. Pause after each toss.  Rotate 2 golf balls in your hand: Both directions.  Pick up coins and place in coin bank or container: Pick up with big, intentional movements. Do not drag coin to the edge.  Pick up 5 coins one at a time and hold in palm. Then, move coins from palm to fingertips one at a time to stack.  Practice writing: Slow down, write big, and focus on forming each letter.  Perform "Flicks"/hand stretches (PWR! Hands): Close hands then flick out your fingers with focus on opening hands, pulling wrists back, and extending elbows like you are pushing.  Hold end of small trash/produce bag in fingers and pull bag into palm by extending fingers BIG  Radial Finger Walk (Active to Counteract Ulnar Deviation    With palm flat on table and held steady, lift or slide fingers one by one toward thumb. Hold fingers in position and lift entire hand up from table to reposition for next repetition. PUSH through palm Repeat __5-10__ times. Do _1___ sessions per day.  MP Extension (Active)    With palm on table, straighten fingers completely at large knuckles, and lift fingers off table.  Hold __2__ seconds. Repeat __5-10__ times. Do _1___ sessions per day.

## 2023-06-28 ENCOUNTER — Ambulatory Visit: Payer: No Typology Code available for payment source | Admitting: Occupational Therapy

## 2023-06-28 ENCOUNTER — Encounter: Payer: Self-pay | Admitting: Occupational Therapy

## 2023-06-28 DIAGNOSIS — R278 Other lack of coordination: Secondary | ICD-10-CM | POA: Diagnosis not present

## 2023-06-28 DIAGNOSIS — R29898 Other symptoms and signs involving the musculoskeletal system: Secondary | ICD-10-CM

## 2023-06-28 DIAGNOSIS — R251 Tremor, unspecified: Secondary | ICD-10-CM

## 2023-06-28 DIAGNOSIS — R29818 Other symptoms and signs involving the nervous system: Secondary | ICD-10-CM

## 2023-06-28 DIAGNOSIS — R41840 Attention and concentration deficit: Secondary | ICD-10-CM

## 2023-06-28 NOTE — Therapy (Signed)
OUTPATIENT OCCUPATIONAL THERAPY [facility]  Patient Name: Zachary Wang. MRN: [id number] DOB:15-Aug-1941, 82 y.o., male Today's Date: 06/28/2023  PCP: Chilton Greathouse REFERRING PROVIDER: Lenice Pressman, MD (from [place]) - will send POC to PCP  END OF SESSION:  OT End of Session - 06/28/23 0802     Visit Number 5    Number of Visits 13    Date for OT Re-Evaluation 07/30/23    Authorization Type VA - approved 06/02/23 - 09/30/23    OT Start Time 0800    OT Stop Time 0845    OT Time Calculation (min) 45 min    Activity Tolerance Patient tolerated treatment well    Behavior During Therapy [facility] for tasks assessed/performed              Past Medical History:  Diagnosis Date   Arthritis    hands   BPH (benign prostatic hyperplasia)    Cancer (HCC)    skin cancer   Elevated PSA    Foley catheter in place    History of adenomatous polyp of colon    2014  tubular adenoma   History of malignant melanoma    s/p  excision left forearm and left axill lymph node bx 09-10-2004   Hyperlipidemia    Hypertension    Peripheral vascular disease (HCC)    Urinary retention    Past Surgical History:  Procedure Laterality Date   ANAL FISSURE REPAIR  08/17/1982   COLONOSCOPY N/A 05/02/2013   Procedure: COLONOSCOPY;  Surgeon: Charolett Bumpers, MD;  Location: [facility];  Service: Endoscopy;  Laterality: N/A;   INSERTION OF SUPRAPUBIC CATHETER N/A 09/30/2015   Procedure: INSERTION OF SUPRAPUBIC CATHETER;  Surgeon: Ihor Gully, MD;  Location: [facility];  Service: Urology;  Laterality: N/A;   SHOULDER ARTHROSCOPY W/ SUBACROMIAL DECOMPRESSION AND DISTAL CLAVICLE EXCISION Left 05/20/2010   w/  Debridement , Bursectomy, Acromioplasty,  Capsulectomy   TONSILLECTOMY     TRANSURETHRAL RESECTION OF PROSTATE N/A 09/30/2015   Procedure: TURP (TRANSURETHRAL RESECTION OF PROSTATE WITH GYRUS;  Surgeon: Ihor Gully, MD;  Location: [facility];  Service:  Urology;  Laterality: N/A;   TRANSURETHRAL RESECTION OF PROSTATE N/A 09/21/2022   Procedure: BIPOLAR TRANSURETHRAL RESECTION OF THE PROSTATE (TURP);  Surgeon: Jannifer Hick, MD;  Location: [facility];  Service: Urology;  Laterality: N/A;  75 MINUTES NEEDED FOR CASE   WIDE EXCISION MALIGNANT MELANOMA LEFT FOREARM/  LEFT AXILLA LYMPH NODE BX  09/10/2004   Patient Active Problem List   Diagnosis Date Noted   BPH (benign prostatic hyperplasia) 09/21/2022   Parkinson's disease (HCC) 04/13/2017   Benign prostatic hyperplasia with urinary retention 09/30/2015    ONSET DATE: 05/20/2023  REFERRING DIAG: G20.A1 (ICD-10-CM) - Parkinson's disease (HCC)  THERAPY DIAG:  Other lack of coordination  Attention and concentration deficit  Other symptoms and signs involving the nervous system  Tremor  Other symptoms and signs involving the musculoskeletal system  Rationale for Evaluation and Treatment: Rehabilitation  SUBJECTIVE:   SUBJECTIVE STATEMENT: No pain.  Pt accompanied by: self  PERTINENT HISTORY: PD (Diagnosed 2018), HTN, HLD, OA, h/o melanoma, PVD  PRECAUTIONS: None  WEIGHT BEARING RESTRICTIONS: No  PAIN:  Are you having pain? No  FALLS: Has patient fallen in last 6 months? No, but stumbles  LIVING ENVIRONMENT: Lives with: lives with their spouse Lives in: 2 story home, bedroom/full bath on first floor. 3 steps to enter Has following equipment at home: Grab bars  PLOF: Independent with basic ADLs  PATIENT GOALS: work on buttons and balance  OBJECTIVE:  Note: Objective measures were completed at Evaluation unless otherwise noted.  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, 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 Handwriting: 100% legible for short sentence in cursive, Mild micrographia reported at times   MOBILITY STATUS: Independent  POSTURE COMMENTS:  rounded shoulders and forward head   FUNCTIONAL OUTCOME MEASURES: Fastening/unfastening 3 buttons: 43 sec (29.70 sec at screen on 04/15/23) Physical performance test: PPT#2 (simulated eating) 13.05 & PPT#4 (donning/doffing jacket): 11.12 sec  but does report getting second arm stuck at times which takes longer  COORDINATION: 9 Hole Peg test: Right: 26.80 sec; Left: 48.79 sec (slower than previous admission, but also d/t wrist OA on Lt) Box and Blocks:  Right 50 blocks, Left 44 blocks (noted mild tremor Lt hand) Tremors: Right, Left, and mostly happens when excited/stressed/rushed  UE ROM:  WFL and except Lt wrist limited d/t OA  SENSATION: WFL   COGNITION: Overall cognitive status:  Word finding difficulties reported  OBSERVATIONS: Bradykinesia and Hypokinesia Pt does PWR! Moves class, Tai Chi, and cycling in community   TODAY'S TREATMENT:                                                                                                                               Pt brought in binder again and organized with PD ex chart for greater carryover at home. Pt admits to not really doing PWR! Moves at home, only in [place]! Class. Therefore, added one PWR! Position for each day.   Pt also issued most updated community resources info, ways to prevent future related PD complications, and handwriting strategies and reviewed all. Did not have time however to practice handwriting    PATIENT  EDUCATION: Education details: see above Person educated: Patient Education method: Explanation, Demonstration, Verbal cues, and Handouts Education comprehension: verbalized understanding, returned demonstration, and needs further education  HOME EXERCISE PROGRAM: 10/31: PWR! Hands and BIG ADL strategies 06/22/23: Strategies for tremors 06/24/23: updated coordination HEP  06/28/23: PD ex chart, ways to prevent future PD complications, suggestions for handwriting, community resources  GOALS: Goals reviewed with patient? Yes  SHORT TERM GOALS: Target date: 07/02/23  Independent with updated coordination HEP  Baseline: Goal status: MET  2.  Pt will verbalize understanding of adaptive strategies to  increase ease with ADLS/IADLS  (especially re: buttons and jacket)  Baseline:  Goal status: IN PROGRESS  3.  Pt will verbalize understanding of ways to keep thinking skills sharp and ways to compensate for STM changes in the future  Baseline:  Goal status: INITIAL  4.  Pt will verbalize understanding of ways to prevent future PD related complications and appropriate community resources prn  Baseline:  Goal status: MET   LONG TERM GOALS: Target date: 07/30/23  Pt will demonstrate improved ease with fastening buttons as evidenced by decreasing 3 button/unbutton time by 10 seconds or more Baseline: 43 sec Goal status: INITIAL  2.  Pt will demonstrate improved fine motor coordination for ADLs as evidenced by decreasing 9 hole peg test score for Lt hand  by 5 secs or greater Baseline: 48.79 sec Goal status: INITIAL  3.  Pt will write a paragraph with no significant decrease in size and maintain 100% legibility  Baseline:  Goal status: INITIAL  ASSESSMENT:  CLINICAL IMPRESSION: Patient verbalizes and demonstrates good understanding of PD specific strategies as needed to progress towards goals. Pt has met 2 STG's at this time.   PERFORMANCE DEFICITS: in functional skills including  ADLs, IADLs, coordination, dexterity, ROM, Fine motor control, mobility, balance, body mechanics, decreased knowledge of use of DME, vision, and UE functional use, cognitive skills including memory, problem solving, and divided attention .   IMPAIRMENTS: are limiting patient from ADLs, IADLs, and leisure.   COMORBIDITIES:  may have co-morbidities  that affects occupational performance. Patient will benefit from skilled OT to address above impairments and improve overall function.  REHAB POTENTIAL: Good  PLAN:  OT FREQUENCY: 2x/week  OT DURATION: 6 weeks (date extended d/t scheduling conflicts)   PLANNED INTERVENTIONS: 97535 self care/ADL training, 96295 therapeutic exercise, 97530 therapeutic activity, 97112 neuromuscular re-education, 97140 manual therapy, 97039 fluidotherapy, 97760 Orthotics management and training, 28413 Splinting (initial encounter), passive range of motion, functional mobility training, visual/perceptual remediation/compensation, coping strategies training, patient/family education, and DME and/or AE instructions  RECOMMENDED OTHER SERVICES: none at this time  CONSULTED AND AGREED WITH PLAN OF CARE: Patient  PLAN FOR NEXT SESSION: Practice handwriting, donning/doffing jacket w/ different fabrics, if time - issue ways to keep thinking skills sharp, begin checking remaining goals   Sheran Lawless, OT 06/28/2023, 8:03 AM

## 2023-06-28 NOTE — Patient Instructions (Addendum)
(Exercise) Monday Tuesday Wednesday Thursday Friday Saturday Sunday   PWR! Hands           Coordination           PWR! Supine           PWR! Prone           PWR! Sitting           PWR! All 4's           PWR! standing           PWR! Class           Tai Chi                                        Ways to prevent future Parkinson's related complications:  1.   Exercise regularly/daily.    2.   Focus on BIGGER movements during daily activities- really reach overhead, straighten elbows and extend fingers  3.   When dressing (especially jacket/coat) or reaching for your seatbelt make sure to use your body to assist by twisting while you reach and looking at where you are reaching - this can help to minimize stress on the shoulder and reduce the risk of a rotator cuff tear  4.   Swing your arms when you walk (unless using walker)! People with PD are at increased risk for frozen shoulder and swinging your arms can reduce this risk.  5. Drink plenty of water and eat a high fiber diet (fresh fruits and veggies, nuts/seeds, fish). Avoid canned foods, red meats, and dairy when possible  6. Do NOT take your Parkinson's medication around meals (avoid taking 30 min before a meal, or 1 hour after a meal), especially protein as it blocks the absorption of the medicine and will not work effectively  7. Keep your feet apart when you are standing (wider stance) to allow you to have better balance and to reach further with your arms. Also make sure your feet are apart before standing up.     Suggestions for Handwriting Changes  Many people with Parkinson's notice changes in their handwriting.  Handwriting often becomes small and cramped, and can become more difficult to control when writing for longer periods of time.  This handwriting change is called micrographia.  Why does micrographia occur?  Parkinson's can cause slowing of movement, and feelings of  muscle stiffness in the hands and fingers.  Loss of automatic motion also affects the easy, flowing motion of handwriting.  This can impact even simple writing tasks such as signing your name or writing a shopping list.  Attempts to write quickly without thinking about forming each letter contributes to small, cramped handwriting, and may cause the hand to develop a feeling of tightness.  How can I make writing easier?  Make a deliberate effort to form each letter.  This can be hard to do at first, but is very effective in improving size and legibility of handwriting.  Use a pen grip (round or triangular shaped rubber or foam cylinders available at stationary stores or where writing materials are found) or a larger size pen to keep your hand more relaxed.  Try printing rather than writing in a cursive style. Printing causes you to pause briefly between each letter, keeping writing more legible.   Using lined paper may provide a "visual target" to keep all letters big  when writing.   A ballpoint pen typically works better than felt tip or "rolling writer"/gel styles.  Rest your hand if it begins to feel "tight".  Pause briefly when you see your handwriting becoming smaller.  Avoid hurrying or trying to write long passages if you are feeling stressed or fatigued.  Practice helps!  Remind yourself to slow down, aim big, and pause often!  Perform "flicks"/PWR! Hands if your hand feels tight, your writing gets smaller, before you start writing, or if tremors increase.  Involving your team: An occupational therapist can provide assessment and individual recommendations for improvement of your handwriting.  This handout was adapted from [email or web address] [employer]

## 2023-06-30 ENCOUNTER — Ambulatory Visit: Payer: No Typology Code available for payment source | Admitting: Occupational Therapy

## 2023-06-30 DIAGNOSIS — R41840 Attention and concentration deficit: Secondary | ICD-10-CM

## 2023-06-30 DIAGNOSIS — R278 Other lack of coordination: Secondary | ICD-10-CM

## 2023-06-30 DIAGNOSIS — R29818 Other symptoms and signs involving the nervous system: Secondary | ICD-10-CM

## 2023-06-30 DIAGNOSIS — R2681 Unsteadiness on feet: Secondary | ICD-10-CM

## 2023-06-30 NOTE — Therapy (Signed)
OUTPATIENT OCCUPATIONAL THERAPY [facility]  Patient Name: Zachary Wang. MRN: [id number] DOB:[birth year], 82 y.o., male Today's Date: 06/30/2023  PCP: Chilton Greathouse REFERRING PROVIDER: Lenice Pressman, MD (from [place]) - will send POC to PCP  END OF SESSION:  OT End of Session - 06/30/23 0808     Visit Number 6    Number of Visits 13    Date for OT Re-Evaluation 07/30/23    Authorization Type [place] - approved 06/02/23 - 09/30/23    OT Start Time 0802    OT Stop Time 0845    OT Time Calculation (min) 43 min    Activity Tolerance Patient tolerated treatment well    Behavior During Therapy [facility] for tasks assessed/performed              Past Medical History:  Diagnosis Date   Arthritis    hands   BPH (benign prostatic hyperplasia)    Cancer (HCC)    skin cancer   Elevated PSA    Foley catheter in place    History of adenomatous polyp of colon    2014  tubular adenoma   History of malignant melanoma    s/p  excision left forearm and left axill lymph node bx 09-10-2004   Hyperlipidemia    Hypertension    Peripheral vascular disease (HCC)    Urinary retention    Past Surgical History:  Procedure Laterality Date   ANAL FISSURE REPAIR  08/17/1982   COLONOSCOPY N/A 05/02/2013   Procedure: COLONOSCOPY;  Surgeon: Charolett Bumpers, MD;  Location: [facility];  Service: Endoscopy;  Laterality: N/A;   INSERTION OF SUPRAPUBIC CATHETER N/A 09/30/2015   Procedure: INSERTION OF SUPRAPUBIC CATHETER;  Surgeon: Ihor Gully, MD;  Location: [facility];  Service: Urology;  Laterality: N/A;   SHOULDER ARTHROSCOPY W/ SUBACROMIAL DECOMPRESSION AND DISTAL CLAVICLE EXCISION Left 05/20/2010   w/  Debridement , Bursectomy, Acromioplasty,  Capsulectomy   TONSILLECTOMY     TRANSURETHRAL RESECTION OF PROSTATE N/A 09/30/2015   Procedure: TURP (TRANSURETHRAL RESECTION OF PROSTATE WITH GYRUS;  Surgeon: Ihor Gully, MD;  Location: [facility];  Service:  Urology;  Laterality: N/A;   TRANSURETHRAL RESECTION OF PROSTATE N/A 09/21/2022   Procedure: BIPOLAR TRANSURETHRAL RESECTION OF THE PROSTATE (TURP);  Surgeon: Jannifer Hick, MD;  Location: [facility];  Service: Urology;  Laterality: N/A;  75 MINUTES NEEDED FOR CASE   WIDE EXCISION MALIGNANT MELANOMA LEFT FOREARM/  LEFT AXILLA LYMPH NODE BX  09/10/2004   Patient Active Problem List   Diagnosis Date Noted   BPH (benign prostatic hyperplasia) 09/21/2022   Parkinson's disease (HCC) 04/13/2017   Benign prostatic hyperplasia with urinary retention 09/30/2015    ONSET DATE: 05/20/2023  REFERRING DIAG: G20.A1 (ICD-10-CM) - Parkinson's disease (HCC)  THERAPY DIAG:  Other lack of coordination  Attention and concentration deficit  Other symptoms and signs involving the nervous system  Unsteadiness on feet  Rationale for Evaluation and Treatment: Rehabilitation  SUBJECTIVE:   SUBJECTIVE STATEMENT: No pain.  Pt accompanied by: self  PERTINENT HISTORY: PD (Diagnosed 2018), HTN, HLD, OA, h/o melanoma, PVD  PRECAUTIONS: None  WEIGHT BEARING RESTRICTIONS: No  PAIN:  Are you having pain? No  FALLS: Has patient fallen in last 6 months? No, but stumbles  LIVING ENVIRONMENT: Lives with: lives with their spouse Lives in: 2 story home, bedroom/full bath on first floor. 3 steps to enter Has following equipment at home: Grab bars  PLOF: Independent with basic ADLs  PATIENT  GOALS: work on buttons and balance  OBJECTIVE:  Note: Objective measures were completed at Evaluation unless otherwise noted.  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, 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 Handwriting: 100% legible for short sentence in cursive, Mild micrographia reported at times   MOBILITY STATUS: Independent  POSTURE COMMENTS:  rounded shoulders and forward head   FUNCTIONAL OUTCOME MEASURES: Fastening/unfastening 3 buttons: 43 sec (29.70 sec at screen on 04/15/23) Physical performance test: PPT#2 (simulated eating) 13.05 & PPT#4 (donning/doffing jacket): 11.12 sec  but does report getting second arm stuck at times which takes longer  COORDINATION: 9 Hole Peg test: Right: 26.80 sec; Left: 48.79 sec (slower than previous admission, but also d/t wrist OA on Lt) Box and Blocks:  Right 50 blocks, Left 44 blocks (noted mild tremor Lt hand) Tremors: Right, Left, and mostly happens when excited/stressed/rushed  UE ROM:  WFL and except Lt wrist limited d/t OA  SENSATION: WFL   COGNITION: Overall cognitive status:  Word finding difficulties reported  OBSERVATIONS: Bradykinesia and Hypokinesia Pt does PWR! Moves class, Tai Chi, and cycling in community   TODAY'S TREATMENT:                                                                                                                               Practiced handwriting w/ cues to slow down and aim big - pt met LTG #3  Practiced strategies for donning and doffing jacket (long sleeve cotton jacket over long sleeve shirt) PPT #4 = 14.80 sec.   Reviewed strategies for buttons and practiced. 3 button/unbutton test = 31.08 sec - met LTG #1  Discussed ways to keep thinking skills sharp including: trying new tasks, and ways to perform dual tasking. Handout provided.  Pt also reports word finding difficulties and suggested listing  synonyms or describing the word as an alternative option.   PATIENT EDUCATION: Education details: ways to keep thinking skills sharp Person educated: Patient Education method: Explanation and Handouts Education comprehension: verbalized understanding  HOME EXERCISE PROGRAM: 10/31: PWR! Hands and BIG ADL strategies 06/22/23: Strategies for tremors 06/24/23: updated coordination HEP  06/28/23: PD ex chart, ways to prevent future PD complications, suggestions for handwriting, community resources 06/30/23: ways to keep thinking skills sharp  GOALS: Goals reviewed with patient? Yes  SHORT TERM GOALS: Target date: 07/02/23  Independent with updated  coordination HEP  Baseline: Goal status: MET  2.  Pt will verbalize understanding of adaptive strategies to increase ease with ADLS/IADLS  (especially re: buttons and jacket)  Baseline:  Goal status: MET   3.  Pt will verbalize understanding of ways to keep thinking skills sharp and ways to compensate for STM changes in the future  Baseline:  Goal status: MET  4.  Pt will verbalize understanding of ways to prevent future PD related complications and appropriate community resources prn  Baseline:  Goal status: MET   LONG TERM GOALS: Target date: 07/30/23  Pt will demonstrate improved ease with fastening buttons as evidenced by decreasing 3 button/unbutton time by 10 seconds or more Baseline: 43 sec Goal status: MET (31 sec)   2.  Pt will demonstrate improved fine motor coordination for ADLs as evidenced by decreasing 9 hole peg test score for Lt hand  by 5 secs or greater Baseline: 48.79 sec Goal status: INITIAL  3.  Pt will write a paragraph with no significant decrease in size and maintain 100% legibility  Baseline:  Goal status: MET   ASSESSMENT:  CLINICAL IMPRESSION: Patient has met all STG's at this time and 2 LTG's. Pt is progressing towards remaining goals. Pt continues to benefit from O.T. to address bradykinesia,  coordination, and function.   PERFORMANCE DEFICITS: in functional skills including ADLs, IADLs, coordination, dexterity, ROM, Fine motor control, mobility, balance, body mechanics, decreased knowledge of use of DME, vision, and UE functional use, cognitive skills including memory, problem solving, and divided attention .   IMPAIRMENTS: are limiting patient from ADLs, IADLs, and leisure.   COMORBIDITIES:  may have co-morbidities  that affects occupational performance. Patient will benefit from skilled OT to address above impairments and improve overall function.  REHAB POTENTIAL: Good  PLAN:  OT FREQUENCY: 2x/week  OT DURATION: 6 weeks (date extended d/t scheduling conflicts)   PLANNED INTERVENTIONS: 97535 self care/ADL training, 52841 therapeutic exercise, 97530 therapeutic activity, 97112 neuromuscular re-education, 97140 manual therapy, 97039 fluidotherapy, 97760 Orthotics management and training, 32440 Splinting (initial encounter), passive range of motion, functional mobility training, visual/perceptual remediation/compensation, coping strategies training, patient/family education, and DME and/or AE instructions  RECOMMENDED OTHER SERVICES: none at this time  CONSULTED AND AGREED WITH PLAN OF CARE: Patient  PLAN FOR NEXT SESSION: review ADL strategies prn, practice PWR! Twist on all 4's or modified standing over low surface in prep for golf, standing w/ coordination/reaching task   Sheran Lawless, OT 06/30/2023, 8:09 AM

## 2023-06-30 NOTE — Patient Instructions (Addendum)
   Keeping Thinking Skills Sharp: 1. Jigsaw puzzles 2. Card/board games 3. Talking on the phone/social events 4. [email or web address] 5. Online games 6. Word serches/crossword puzzles 7.  Logic puzzles 8. Aerobic exercise (stationary bike) 9. Eating balanced diet (fruits & veggies) 10. Drink water 11. Try something new--new recipe, hobby 12. Crafts 13. Do a variety of activities that are challenging 14.  Plan weekly meals and write a grocery list 15. Add cognitive activities to walking/exercising (think of animal/food/city with each letter of the alphabet, counting backwards, thinking of as many vegetables as you can, etc.).--Only do this  If safe (no freezing/falls).  

## 2023-07-05 ENCOUNTER — Encounter: Payer: Self-pay | Admitting: Occupational Therapy

## 2023-07-05 ENCOUNTER — Ambulatory Visit: Payer: No Typology Code available for payment source | Admitting: Occupational Therapy

## 2023-07-05 DIAGNOSIS — R278 Other lack of coordination: Secondary | ICD-10-CM

## 2023-07-05 DIAGNOSIS — R41840 Attention and concentration deficit: Secondary | ICD-10-CM

## 2023-07-05 DIAGNOSIS — R29818 Other symptoms and signs involving the nervous system: Secondary | ICD-10-CM

## 2023-07-05 NOTE — Therapy (Signed)
OUTPATIENT OCCUPATIONAL THERAPY [facility]  Patient Name: Zachary Wang. MRN: [id number] DOB:1940-12-02, 82 y.o., male Today's Date: 07/05/2023  PCP: Chilton Greathouse REFERRING PROVIDER: Lenice Pressman, MD (from [place]) - will send POC to PCP  END OF SESSION:  OT End of Session - 07/05/23 0805     Visit Number 7    Number of Visits 13    Date for OT Re-Evaluation 07/30/23    Authorization Type VA - approved 06/02/23 - 09/30/23    OT Start Time 0803    OT Stop Time 0845    OT Time Calculation (min) 42 min    Activity Tolerance Patient tolerated treatment well    Behavior During Therapy [facility] for tasks assessed/performed              Past Medical History:  Diagnosis Date   Arthritis    hands   BPH (benign prostatic hyperplasia)    Cancer (HCC)    skin cancer   Elevated PSA    Foley catheter in place    History of adenomatous polyp of colon    2014  tubular adenoma   History of malignant melanoma    s/p  excision left forearm and left axill lymph node bx 09-10-2004   Hyperlipidemia    Hypertension    Peripheral vascular disease (HCC)    Urinary retention    Past Surgical History:  Procedure Laterality Date   ANAL FISSURE REPAIR  08/17/1982   COLONOSCOPY N/A 05/02/2013   Procedure: COLONOSCOPY;  Surgeon: Charolett Bumpers, MD;  Location: [facility];  Service: Endoscopy;  Laterality: N/A;   INSERTION OF SUPRAPUBIC CATHETER N/A 09/30/2015   Procedure: INSERTION OF SUPRAPUBIC CATHETER;  Surgeon: Ihor Gully, MD;  Location: [facility];  Service: Urology;  Laterality: N/A;   SHOULDER ARTHROSCOPY W/ SUBACROMIAL DECOMPRESSION AND DISTAL CLAVICLE EXCISION Left 05/20/2010   w/  Debridement , Bursectomy, Acromioplasty,  Capsulectomy   TONSILLECTOMY     TRANSURETHRAL RESECTION OF PROSTATE N/A 09/30/2015   Procedure: TURP (TRANSURETHRAL RESECTION OF PROSTATE WITH GYRUS;  Surgeon: Ihor Gully, MD;  Location: [facility];  Service:  Urology;  Laterality: N/A;   TRANSURETHRAL RESECTION OF PROSTATE N/A 09/21/2022   Procedure: BIPOLAR TRANSURETHRAL RESECTION OF THE PROSTATE (TURP);  Surgeon: Jannifer Hick, MD;  Location: [facility];  Service: Urology;  Laterality: N/A;  75 MINUTES NEEDED FOR CASE   WIDE EXCISION MALIGNANT MELANOMA LEFT FOREARM/  LEFT AXILLA LYMPH NODE BX  09/10/2004   Patient Active Problem List   Diagnosis Date Noted   BPH (benign prostatic hyperplasia) 09/21/2022   Parkinson's disease (HCC) 04/13/2017   Benign prostatic hyperplasia with urinary retention 09/30/2015    ONSET DATE: 05/20/2023  REFERRING DIAG: G20.A1 (ICD-10-CM) - Parkinson's disease (HCC)  THERAPY DIAG:  Other symptoms and signs involving the nervous system  Other lack of coordination  Attention and concentration deficit  Rationale for Evaluation and Treatment: Rehabilitation  SUBJECTIVE:   SUBJECTIVE STATEMENT: No pain and no falls.  Pt accompanied by: self  PERTINENT HISTORY: PD (Diagnosed 2018), HTN, HLD, OA, h/o melanoma, PVD  PRECAUTIONS: None  WEIGHT BEARING RESTRICTIONS: No  PAIN:  Are you having pain? No  FALLS: Has patient fallen in last 6 months? No, but stumbles  LIVING ENVIRONMENT: Lives with: lives with their spouse Lives in: 2 story home, bedroom/full bath on first floor. 3 steps to enter Has following equipment at home: Grab bars  PLOF: Independent with basic ADLs  PATIENT GOALS:  work on buttons and balance  OBJECTIVE:  Note: Objective measures were completed at Evaluation unless otherwise noted.  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, 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 Handwriting: 100% legible for short sentence in cursive, Mild micrographia reported at times   MOBILITY STATUS: Independent  POSTURE COMMENTS:  rounded shoulders and forward head   FUNCTIONAL OUTCOME MEASURES: Fastening/unfastening 3 buttons: 43 sec (29.70 sec at screen on 04/15/23) Physical performance test: PPT#2 (simulated eating) 13.05 & PPT#4 (donning/doffing jacket): 11.12 sec  but does report getting second arm stuck at times which takes longer  COORDINATION: 9 Hole Peg test: Right: 26.80 sec; Left: 48.79 sec (slower than previous admission, but also d/t wrist OA on Lt) Box and Blocks:  Right 50 blocks, Left 44 blocks (noted mild tremor Lt hand) Tremors: Right, Left, and mostly happens when excited/stressed/rushed  UE ROM:  WFL and except Lt wrist limited d/t OA  SENSATION: WFL   COGNITION: Overall cognitive status:  Word finding difficulties reported  OBSERVATIONS: Bradykinesia and Hypokinesia Pt does PWR! Moves class, Tai Chi, and cycling in community   TODAY'S TREATMENT:                                                                                                                               Pt shown modified PWR! Twist standing over table and recommended doing prior to golfing for stretch and to prevent back and shoulder injuries. Pt instructed to do on all 4's if possible but if unable, the modification is good as well  Reviewed ADL strategies verbally.   Pt standing to place pegs in pegboard on vertical surface with bilateral reaching, trunk rotation, and wt shifts required for whole body activation and coordination. Pt w/ more difficulty LUE for coordination w/ cues provided  for greater success  UBE x 5 mintues, level 3 for UE strength/endurance, keeping RPM > 40  PATIENT EDUCATION: Education details: ways to keep thinking skills sharp Person educated: Patient Education method: Explanation and Handouts Education comprehension: verbalized understanding  HOME EXERCISE PROGRAM: 10/31: PWR! Hands and BIG ADL strategies 06/22/23: Strategies for tremors 06/24/23: updated coordination HEP  06/28/23: PD ex chart, ways to prevent future PD complications, suggestions for handwriting, community resources 06/30/23: ways to keep thinking skills sharp  GOALS: Goals reviewed with patient? Yes  SHORT TERM GOALS: Target date:  07/02/23  Independent with updated coordination HEP  Baseline: Goal status: MET  2.  Pt will verbalize understanding of adaptive strategies to increase ease with ADLS/IADLS  (especially re: buttons and jacket)  Baseline:  Goal status: MET   3.  Pt will verbalize understanding of ways to keep thinking skills sharp and ways to compensate for STM changes in the future  Baseline:  Goal status: MET  4.  Pt will verbalize understanding of ways to prevent future PD related complications and appropriate community resources prn  Baseline:  Goal status: MET   LONG TERM GOALS: Target date: 07/30/23  Pt will demonstrate improved ease with fastening buttons as evidenced by decreasing 3 button/unbutton time by 10 seconds or more Baseline: 43 sec Goal status: MET (31 sec)   2.  Pt will demonstrate improved fine motor coordination for ADLs as evidenced by decreasing 9 hole peg test score for Lt hand  by 5 secs or greater Baseline: 48.79 sec Goal status: INITIAL  3.  Pt will write a paragraph with no significant decrease in size and maintain 100% legibility  Baseline:  Goal status: MET   ASSESSMENT:  CLINICAL IMPRESSION: Patient has met all STG's at this time and 2 LTG's. Pt is progressing towards remaining goals. Pt continues to benefit from  O.T. to address bradykinesia, coordination, and function.   PERFORMANCE DEFICITS: in functional skills including ADLs, IADLs, coordination, dexterity, ROM, Fine motor control, mobility, balance, body mechanics, decreased knowledge of use of DME, vision, and UE functional use, cognitive skills including memory, problem solving, and divided attention .   IMPAIRMENTS: are limiting patient from ADLs, IADLs, and leisure.   COMORBIDITIES:  may have co-morbidities  that affects occupational performance. Patient will benefit from skilled OT to address above impairments and improve overall function.  REHAB POTENTIAL: Good  PLAN:  OT FREQUENCY: 2x/week  OT DURATION: 6 weeks (date extended d/t scheduling conflicts)   PLANNED INTERVENTIONS: 97535 self care/ADL training, 96295 therapeutic exercise, 97530 therapeutic activity, 97112 neuromuscular re-education, 97140 manual therapy, 97039 fluidotherapy, 97760 Orthotics management and training, 28413 Splinting (initial encounter), passive range of motion, functional mobility training, visual/perceptual remediation/compensation, coping strategies training, patient/family education, and DME and/or AE instructions  RECOMMENDED OTHER SERVICES: none at this time  CONSULTED AND AGREED WITH PLAN OF CARE: Patient  PLAN FOR NEXT SESSION: practice blaze pods in standing, scarf activity w/ walking, and standing w/ cognitive task, consider d/c in 1-2 more visits   Sheran Lawless, OT 07/05/2023, 8:05 AM

## 2023-07-07 ENCOUNTER — Encounter: Payer: Self-pay | Admitting: Occupational Therapy

## 2023-07-07 ENCOUNTER — Ambulatory Visit: Payer: No Typology Code available for payment source | Admitting: Occupational Therapy

## 2023-07-07 DIAGNOSIS — R29898 Other symptoms and signs involving the musculoskeletal system: Secondary | ICD-10-CM

## 2023-07-07 DIAGNOSIS — R278 Other lack of coordination: Secondary | ICD-10-CM | POA: Diagnosis not present

## 2023-07-07 DIAGNOSIS — R251 Tremor, unspecified: Secondary | ICD-10-CM

## 2023-07-07 DIAGNOSIS — R29818 Other symptoms and signs involving the nervous system: Secondary | ICD-10-CM

## 2023-07-07 DIAGNOSIS — R41840 Attention and concentration deficit: Secondary | ICD-10-CM

## 2023-07-07 NOTE — Therapy (Signed)
OUTPATIENT OCCUPATIONAL THERAPY PARKINSON'S TREATMENT/DISCHARGE  Patient Name: Zachary Wang. MRN: [id number] DOB:1941/02/21, 82 y.o., male Today's Date: 07/07/2023  PCP: Chilton Greathouse REFERRING PROVIDER: Lenice Pressman, MD (from [place]) - will send POC to PCP   OCCUPATIONAL THERAPY DISCHARGE SUMMARY  Visits from Start of Care: 8  Current functional level related to goals / functional outcomes: SEE BELOW   Remaining deficits: Tremor Lt hand (some action tremor - worse when rushing) Bradykinesia Coordination   Education / Equipment: See below under: "Home Exercise Program"  Patient agrees to discharge. Patient goals were met. Patient is being discharged due to being pleased with the current functional level..     END OF SESSION:  OT End of Session - 07/07/23 0803     Visit Number 8    Number of Visits 13    Date for OT Re-Evaluation 07/30/23    Authorization Type [facility] - approved 06/02/23 - 09/30/23    OT Start Time 0800    OT Stop Time 0845    OT Time Calculation (min) 45 min    Activity Tolerance Patient tolerated treatment well    Behavior During Therapy WFL for tasks assessed/performed              Past Medical History:  Diagnosis Date   Arthritis    hands   BPH (benign prostatic hyperplasia)    Cancer (HCC)    skin cancer   Elevated PSA    Foley catheter in place    History of adenomatous polyp of colon    2014  tubular adenoma   History of malignant melanoma    s/p  excision left forearm and left axill lymph node bx 09-10-2004   Hyperlipidemia    Hypertension    Peripheral vascular disease (HCC)    Urinary retention    Past Surgical History:  Procedure Laterality Date   ANAL FISSURE REPAIR  08/17/1982   COLONOSCOPY N/A 05/02/2013   Procedure: COLONOSCOPY;  Surgeon: Charolett Bumpers, MD;  Location: [facility];  Service: Endoscopy;  Laterality: N/A;   INSERTION OF SUPRAPUBIC CATHETER N/A 09/30/2015   Procedure: INSERTION OF SUPRAPUBIC  CATHETER;  Surgeon: Ihor Gully, MD;  Location: [facility];  Service: Urology;  Laterality: N/A;   SHOULDER ARTHROSCOPY W/ SUBACROMIAL DECOMPRESSION AND DISTAL CLAVICLE EXCISION Left 05/20/2010   w/  Debridement , Bursectomy, Acromioplasty,  Capsulectomy   TONSILLECTOMY     TRANSURETHRAL RESECTION OF PROSTATE N/A 09/30/2015   Procedure: TURP (TRANSURETHRAL RESECTION OF PROSTATE WITH GYRUS;  Surgeon: Ihor Gully, MD;  Location: [facility];  Service: Urology;  Laterality: N/A;   TRANSURETHRAL RESECTION OF PROSTATE N/A 09/21/2022   Procedure: BIPOLAR TRANSURETHRAL RESECTION OF THE PROSTATE (TURP);  Surgeon: Jannifer Hick, MD;  Location: [facility];  Service: Urology;  Laterality: N/A;  75 MINUTES NEEDED FOR CASE   WIDE EXCISION MALIGNANT MELANOMA LEFT FOREARM/  LEFT AXILLA LYMPH NODE BX  09/10/2004   Patient Active Problem List   Diagnosis Date Noted   BPH (benign prostatic hyperplasia) 09/21/2022   Parkinson's disease (HCC) 04/13/2017   Benign prostatic hyperplasia with urinary retention 09/30/2015    ONSET DATE: 05/20/2023  REFERRING DIAG: G20.A1 (ICD-10-CM) - Parkinson's disease (HCC)  THERAPY DIAG:  Other symptoms and signs involving the nervous system  Other lack of coordination  Attention and concentration deficit  Tremor  Other symptoms and signs involving the musculoskeletal system  Rationale for Evaluation and Treatment: Rehabilitation  SUBJECTIVE:   SUBJECTIVE STATEMENT: No  pain and no falls.  Pt accompanied by: self  PERTINENT HISTORY: PD (Diagnosed 2018), HTN, HLD, OA, h/o melanoma, PVD  PRECAUTIONS: None  WEIGHT BEARING RESTRICTIONS: No  PAIN:  Are you having pain? No  FALLS: Has patient fallen in last 6 months? No, but stumbles  LIVING ENVIRONMENT: Lives with: lives with their spouse Lives in: 2 story home, bedroom/full bath on first floor. 3 steps to enter Has following equipment at home: Grab bars  PLOF: Independent  with basic ADLs  PATIENT GOALS: work on buttons and balance  OBJECTIVE:  Note: Objective measures were completed at Evaluation unless otherwise noted.  HAND DOMINANCE: Right  ADLs: Eating: independent Grooming: slower UB Dressing: mod I - difficulty w/ buttons LB Dressing: mod I - slower Toileting: mod I  Bathing: mod I  Tub Shower transfers: mod I w/ walk in shower and grab bars  IADLs: Shopping: wife does majority, pt does small purchases occasionally Light housekeeping: has housekeeper coming every 2 weeks, pt takes out trash and loads dishwasher (wife does laundry) Meal Prep: pt grills, wife does majority of cooking Community mobility: drives own vehicle - pt reports confusion w/ lanes (possible diplopia) during a long road trip  Medication management: pt does 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, 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 Handwriting: 100% legible for short sentence in cursive, Mild micrographia reported at times   MOBILITY STATUS: Independent  POSTURE COMMENTS:  rounded shoulders and forward head   FUNCTIONAL OUTCOME MEASURES: Fastening/unfastening 3 buttons: 43 sec (29.70 sec at screen on 04/15/23) Physical performance test: PPT#2 (simulated eating) 13.05 & PPT#4 (donning/doffing jacket): 11.12 sec  but does report getting second arm stuck at times which takes longer  COORDINATION: 9 Hole Peg test: Right: 26.80 sec; Left: 48.79 sec (slower than previous admission, but also d/t wrist OA on Lt) Box and Blocks:  Right 50 blocks, Left 44 blocks (noted mild tremor Lt hand) Tremors: Right, Left, and mostly happens when excited/stressed/rushed  UE ROM:  WFL and except Lt wrist limited d/t OA  SENSATION: WFL   COGNITION: Overall cognitive status:  Word finding difficulties reported  OBSERVATIONS: Bradykinesia and Hypokinesia Pt does [employer] Moves class, Tai Chi, and cycling in community   TODAY'S TREATMENT:                                                                                                                                Assessed remaining LTG - See below. Noted increased tremor Lt hand with 9 hole peg test first trial, however improved when instructed to open hand big before picking up each peg.  Practiced tossing scarf alternating hands while walking for dual tasking b/t 2 physical tasks. Followed by tossing scarf in standing with cognitive task for dual tasking b/t physical and cognitive task w/ min difficulty. Pt encouraged to keep challenging himself w/ novel tasks and dual tasking as long as safety is not compromised.   PATIENT EDUCATION: See  above for today  HOME EXERCISE PROGRAM: 10/31: [employer]! Hands and BIG ADL strategies 06/22/23: Strategies for tremors 06/24/23: updated coordination HEP  06/28/23: PD ex chart, ways to prevent future PD complications, suggestions for handwriting, community resources 06/30/23: ways to keep thinking skills sharp  GOALS: Goals reviewed with patient? Yes  SHORT TERM GOALS: Target date: 07/02/23  Independent with updated coordination HEP  Baseline: Goal status: MET  2.  Pt will verbalize understanding of adaptive strategies to increase ease with ADLS/IADLS  (especially re: buttons and jacket)  Baseline:  Goal status: MET   3.  Pt will verbalize understanding of ways to keep thinking skills sharp and ways to compensate for STM changes in the future  Baseline:  Goal status: MET  4.  Pt will verbalize understanding of ways to prevent future PD related complications and appropriate community resources prn  Baseline:  Goal status: MET   LONG TERM GOALS: Target date: 07/30/23  Pt will demonstrate improved ease with fastening buttons as evidenced by decreasing 3 button/unbutton time by 10 seconds or more Baseline: 43 sec Goal status: MET (31 sec)   2.  Pt will demonstrate improved fine motor coordination for ADLs as evidenced by decreasing 9 hole peg test score for Lt hand  by 5 secs or  greater Baseline: 48.79 sec Goal status: PARTIALLY MET (07/07/23: 1st trial 64 sec, 2nd trial 42 sec) - pt w/ increased tremors Lt hand during activity  3.  Pt will write a paragraph with no significant decrease in size and maintain 100% legibility  Baseline:  Goal status: MET   ASSESSMENT:  CLINICAL IMPRESSION: Patient has met all STG's and LTG's at this time. Scheduled for screens in 6 months  PERFORMANCE DEFICITS: in functional skills including ADLs, IADLs, coordination, dexterity, ROM, Fine motor control, mobility, balance, body mechanics, decreased knowledge of use of DME, vision, and UE functional use, cognitive skills including memory, problem solving, and divided attention .   IMPAIRMENTS: are limiting patient from ADLs, IADLs, and leisure.   COMORBIDITIES:  may have co-morbidities  that affects occupational performance. Patient will benefit from skilled OT to address above impairments and improve overall function.  REHAB POTENTIAL: Good  PLAN:  OT FREQUENCY: 2x/week  OT DURATION: 6 weeks (date extended d/t scheduling conflicts)   PLANNED INTERVENTIONS: 97535 self care/ADL training, 16109 therapeutic exercise, 97530 therapeutic activity, 97112 neuromuscular re-education, 97140 manual therapy, 97039 fluidotherapy, 97760 Orthotics management and training, 60454 Splinting (initial encounter), passive range of motion, functional mobility training, visual/perceptual remediation/compensation, coping strategies training, patient/family education, and DME and/or AE instructions  RECOMMENDED OTHER SERVICES: none at this time  CONSULTED AND AGREED WITH PLAN OF CARE: Patient   PLAN: D/C O.T.  PD screens scheduled for 6 months  Sheran Lawless, OT 07/07/2023, 8:04 AM

## 2023-07-09 NOTE — Progress Notes (Unsigned)
Assessment/Plan:   1.  Parkinsons Disease  -Continue carbidopa/levodopa 25/100, 2/2/1  -Continue carbidopa/levodopa 50/200 at bedtime for cramping of feet/legs  -Stop pramipexole ER, 1.5 mg daily.   -Take pramipexole 0.75 mg tid (this is overall increase and changed from the ER formulation).  No compulsive behaviors.   -Genetic testing completed.  Heterozygous for GBA mutation.  This could provide a risk factor for inherited Parkinson's, but likely would also require an environmental component.  This is different from homozygous carrier, which would have a much greater risk factor (10-20 times).   2.  History of malignant melanoma  -Continues to see dermatology several times per year.  3.  Sialorrhea  -on myobloc  -Continue Robinul, 1 mg twice per day.  No side effects currently.  -he's still struggling some despite the above but probably nothing more to do here  4.  Bph  -s/p TURP in 09/2022  -following with urology  5.  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  On several BP medications and doesn't sound like BP running low  -discuss with pcp  -robinul may contribute?? -   6.  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  Do not suspect dementia.  Meds may contribute  -schedule neurocog testing  -chem and TSH was nl in August  -check b12  Subjective:   Zachary Wang. was seen today in follow up for Parkinsons disease.  My previous records were reviewed prior to todays visit as well as outside records available to me.  Patient doing fairly well from a Parkinson's standpoint.  More tremor in the R arm.  He has been to occupational therapy since our last visit.  Notes reviewed.  He has not had falls but more trouble with balance/walking/freezing.  He is doing tai chi class/spin class/[employer] Moves class.  He sometimes does get off balance.  No syncopal episodes.  He is on myobloc and robinul and feels that he still gets drooling after 3-4 weeks of  injection.  Some dizziness.  Some word finding trouble/memory trouble.  Current prescribed movement disorder medications: Carbidopa/levodopa 25/100, 2/2/1 Pramipexole ER, 1.5 mg daily Carbidopa/levodopa 50/200 CR at bedtime  Robinul, 1 mg twice per day   ALLERGIES:  No Known Allergies  CURRENT MEDICATIONS:  Outpatient Encounter Medications as of 07/13/2023  Medication Sig   acetaminophen (TYLENOL) 650 MG CR tablet Take 650 mg by mouth daily as needed for pain.   amLODipine (NORVASC) 5 MG tablet Take 5 mg by mouth every morning.   atorvastatin (LIPITOR) 20 MG tablet Take 20 mg by mouth every evening.    calcium carbonate (TUMS - DOSED IN MG ELEMENTAL CALCIUM) 500 MG chewable tablet Chew 2 tablets by mouth daily as needed for indigestion or heartburn.   docusate sodium (COLACE) 100 MG capsule Take 1 capsule (100 mg total) by mouth daily as needed for up to 30 doses.   hydrochlorothiazide (HYDRODIURIL) 25 MG tablet Take 25 mg by mouth every morning.    ibuprofen (ADVIL) 200 MG tablet Take 200 mg by mouth daily as needed for moderate pain.   losartan (COZAAR) 100 MG tablet Take 50 mg by mouth 2 (two) times daily.   oxyCODONE-acetaminophen (PERCOCET) 5-325 MG tablet Take 1 tablet by mouth every 4 (four) hours as needed for up to 12 doses for severe pain.   Polyvinyl Alcohol (LIQUID TEARS OP) Place 1 drop into both eyes daily as needed (irritation).   tamsulosin (FLOMAX) 0.4  MG CAPS capsule Take 0.4 mg by mouth at bedtime.   [DISCONTINUED] carbidopa-levodopa (SINEMET CR) 50-200 MG tablet Take 1 tablet by mouth at bedtime.   [DISCONTINUED] carbidopa-levodopa (SINEMET IR) 25-100 MG tablet 2 at 7am, 2 at 11am, 1 at 4pm (Patient taking differently: Take 1-2 tablets by mouth See admin instructions. 2 at 7am, 2 at 11am, 1 at 4pm)   [DISCONTINUED] glycopyrrolate (ROBINUL) 1 MG tablet Take 1 tablet (1 mg total) by mouth 2 (two) times daily.   [DISCONTINUED] Pramipexole Dihydrochloride 1.5 MG TB24 Take 1  tablet (1.5 mg total) by mouth at bedtime.   No facility-administered encounter medications on file as of 07/13/2023.    Objective:   PHYSICAL EXAMINATION:    VITALS:   Vitals:   07/13/23 0833  BP: 138/86  SpO2: 97%  Weight: 220 lb (99.8 kg)  Height: 6\' 2"  (1.88 m)    GEN:  The patient appears stated age and is in NAD. HEENT:  Normocephalic, atraumatic.  The mucous membranes are moist. The superficial temporal arteries are without ropiness or tenderness. CV:  RRR Lungs:  CTAB Neck/HEME:  There are no carotid bruits bilaterally. MS:  he does have LE edema (similar to prior)  Neurological examination:  Orientation: The patient is alert and oriented x3. Cranial nerves: There is good facial symmetry with facial hypomimia. The speech is fluent and clear. Soft palate rises symmetrically and there is no tongue deviation. Hearing is intact to conversational tone. Sensation: Sensation is intact to light touch throughout Motor: Strength is at least antigravity x4.  Movement examination: Tone: There is mild increased tone in the LUE  Abnormal movements: none Coordination:  There is mild decremation, with any form of RAMS, including alternating supination and pronation of the forearm, hand opening and closing, finger taps, heel taps and toe taps on the L Gait and Station: The patient ambulates well in the hall today with good arm swing   Total time spent on today's visit was 40 minutes, including both face-to-face time and nonface-to-face time.  Time included that spent on review of records (prior notes available to me/labs/imaging if pertinent), discussing treatment and goals, answering patient's questions and coordinating care.    Cc:  Chilton Greathouse, MD

## 2023-07-13 ENCOUNTER — Other Ambulatory Visit: Payer: Self-pay

## 2023-07-13 ENCOUNTER — Ambulatory Visit (INDEPENDENT_AMBULATORY_CARE_PROVIDER_SITE_OTHER): Payer: No Typology Code available for payment source | Admitting: Neurology

## 2023-07-13 ENCOUNTER — Other Ambulatory Visit: Payer: No Typology Code available for payment source

## 2023-07-13 VITALS — BP 138/86 | Ht 74.0 in | Wt 220.0 lb

## 2023-07-13 DIAGNOSIS — R413 Other amnesia: Secondary | ICD-10-CM | POA: Diagnosis not present

## 2023-07-13 DIAGNOSIS — E538 Deficiency of other specified B group vitamins: Secondary | ICD-10-CM | POA: Diagnosis not present

## 2023-07-13 DIAGNOSIS — G20A1 Parkinson's disease without dyskinesia, without mention of fluctuations: Secondary | ICD-10-CM

## 2023-07-13 DIAGNOSIS — K117 Disturbances of salivary secretion: Secondary | ICD-10-CM | POA: Diagnosis not present

## 2023-07-13 DIAGNOSIS — R42 Dizziness and giddiness: Secondary | ICD-10-CM

## 2023-07-13 MED ORDER — CARBIDOPA-LEVODOPA 25-100 MG PO TABS: ORAL_TABLET | ORAL | 3 refills | Status: DC

## 2023-07-13 MED ORDER — PRAMIPEXOLE DIHYDROCHLORIDE ER 1.5 MG PO TB24
1.0000 | ORAL_TABLET | Freq: Every day | ORAL | 3 refills | Status: DC
Start: 2023-07-13 — End: 2023-07-13

## 2023-07-13 MED ORDER — CARBIDOPA-LEVODOPA 25-100 MG PO TABS
ORAL_TABLET | ORAL | 3 refills | Status: DC
Start: 2023-07-13 — End: 2023-07-13

## 2023-07-13 MED ORDER — CARBIDOPA-LEVODOPA ER 50-200 MG PO TBCR: 1.0000 | EXTENDED_RELEASE_TABLET | Freq: Every day | ORAL | 3 refills | Status: DC

## 2023-07-13 MED ORDER — CARBIDOPA-LEVODOPA ER 50-200 MG PO TBCR
1.0000 | EXTENDED_RELEASE_TABLET | Freq: Every day | ORAL | 3 refills | Status: DC
Start: 2023-07-13 — End: 2023-07-13

## 2023-07-13 MED ORDER — GLYCOPYRROLATE 1 MG PO TABS
1.0000 mg | ORAL_TABLET | Freq: Two times a day (BID) | ORAL | 1 refills | Status: DC
Start: 2023-07-13 — End: 2023-07-13

## 2023-07-13 MED ORDER — PRAMIPEXOLE DIHYDROCHLORIDE 0.75 MG PO TABS
0.7500 mg | ORAL_TABLET | Freq: Three times a day (TID) | ORAL | 2 refills | Status: DC
Start: 2023-07-13 — End: 2024-01-11

## 2023-07-13 MED ORDER — GLYCOPYRROLATE 1 MG PO TABS: 1.0000 mg | ORAL_TABLET | Freq: Two times a day (BID) | ORAL | 1 refills | Status: DC

## 2023-07-13 MED ORDER — PRAMIPEXOLE DIHYDROCHLORIDE 0.75 MG PO TABS: 0.7500 mg | ORAL_TABLET | Freq: Three times a day (TID) | ORAL | 2 refills | Status: DC

## 2023-07-13 NOTE — Patient Instructions (Addendum)
Stop pramipexole ER START pramipexole 0.75 mg three times per day  Your provider has requested that you have labwork completed today. The lab is located on the Second floor at [address], within the [facility] office. When you get off the elevator, turn right and go in the [facility] [address]; the first brown door on the left.  Tell the ladies behind the desk that you are there for lab work. If you are not called within 15 minutes please check with the front desk.   Once you complete your labs you are free to go. You will receive a call or message via MyChart with your lab results.     You have been referred for a neurocognitive evaluation (i.e., evaluation of memory and thinking abilities). Please bring someone with you to this appointment if possible, as it is helpful for the neuropsychologist to hear from both you and another adult who knows you well. Please bring eyeglasses and hearing aids if you wear them and take any medications as you normally would. Please fully abstain from all alcohol, marijuana, or other substances prior to your appointment.   The evaluation will take approximately 2-3 hours and has two parts:   The first part is a clinical interview with the neuropsychologist, Dr. Milbert Coulter.  During the interview, the neuropsychologist will speak with you and the individual you brought to the appointment.    The second part of the evaluation is testing with the doctor's technician, aka psychometrician, Annabelle Harman or [employer]. During the testing, the technician will ask you to remember different types of material, solve problems, and answer some questionnaires. Your family member will not be present for this portion of the evaluation.   Please note: We have to reserve several hours of the neuropsychologist's time and the psychometrician's time for your evaluation appointment. As such, there is a No-Show fee of $100. If you are unable to attend any of your appointments, please  contact our office as soon as possible to reschedule.

## 2023-07-13 NOTE — Addendum Note (Signed)
Addended by: Ila Mcgill C on: 07/13/2023 09:41 AM   Modules accepted: Orders

## 2023-07-23 ENCOUNTER — Ambulatory Visit: Payer: No Typology Code available for payment source | Admitting: Neurology

## 2023-07-29 ENCOUNTER — Ambulatory Visit: Payer: No Typology Code available for payment source | Attending: Internal Medicine | Admitting: Physical Therapy

## 2023-07-29 ENCOUNTER — Encounter: Payer: Self-pay | Admitting: Physical Therapy

## 2023-07-29 VITALS — BP 146/54 | HR 47

## 2023-07-29 DIAGNOSIS — R278 Other lack of coordination: Secondary | ICD-10-CM | POA: Diagnosis not present

## 2023-07-29 DIAGNOSIS — R293 Abnormal posture: Secondary | ICD-10-CM | POA: Diagnosis present

## 2023-07-29 DIAGNOSIS — R29818 Other symptoms and signs involving the nervous system: Secondary | ICD-10-CM | POA: Insufficient documentation

## 2023-07-29 DIAGNOSIS — R2681 Unsteadiness on feet: Secondary | ICD-10-CM | POA: Insufficient documentation

## 2023-07-29 DIAGNOSIS — G20B1 Parkinson's disease with dyskinesia, without mention of fluctuations: Secondary | ICD-10-CM | POA: Insufficient documentation

## 2023-07-29 NOTE — Therapy (Signed)
OUTPATIENT PHYSICAL THERAPY NEURO EVALUATION   Patient Name: Zachary Wang. MRN: [id number] DOB:1941/06/29, 82 y.o., male Today's Date: 07/30/2023   PCP: Chilton Greathouse  REFERRING PROVIDER: Lenice Pressman, MD (from [place]) - will send POC to PCP   END OF SESSION:  PT End of Session - 07/30/23 1213     Visit Number 1    Number of Visits 9    Date for PT Re-Evaluation 09/28/23   due to delay in scheduling   Authorization Type VA, visit limit 15    PT Start Time 1015    PT Stop Time 1058    PT Time Calculation (min) 43 min    Equipment Utilized During Treatment Gait belt    Activity Tolerance Patient tolerated treatment well    Behavior During Therapy WFL for tasks assessed/performed             Past Medical History:  Diagnosis Date   Arthritis    hands   BPH (benign prostatic hyperplasia)    Cancer (HCC)    skin cancer   Elevated PSA    Foley catheter in place    History of adenomatous polyp of colon    2014  tubular adenoma   History of malignant melanoma    s/p  excision left forearm and left axill lymph node bx 09-10-2004   Hyperlipidemia    Hypertension    Peripheral vascular disease (HCC)    Urinary retention    Past Surgical History:  Procedure Laterality Date   ANAL FISSURE REPAIR  08/17/1982   COLONOSCOPY N/A 05/02/2013   Procedure: COLONOSCOPY;  Surgeon: Charolett Bumpers, MD;  Location: [facility];  Service: Endoscopy;  Laterality: N/A;   INSERTION OF SUPRAPUBIC CATHETER N/A 09/30/2015   Procedure: INSERTION OF SUPRAPUBIC CATHETER;  Surgeon: Ihor Gully, MD;  Location: [facility];  Service: Urology;  Laterality: N/A;   SHOULDER ARTHROSCOPY W/ SUBACROMIAL DECOMPRESSION AND DISTAL CLAVICLE EXCISION Left 05/20/2010   w/  Debridement , Bursectomy, Acromioplasty,  Capsulectomy   TONSILLECTOMY     TRANSURETHRAL RESECTION OF PROSTATE N/A 09/30/2015   Procedure: TURP (TRANSURETHRAL RESECTION OF PROSTATE WITH GYRUS;  Surgeon: Ihor Gully, MD;  Location: [facility];  Service: Urology;  Laterality: N/A;   TRANSURETHRAL RESECTION OF PROSTATE N/A 09/21/2022   Procedure: BIPOLAR TRANSURETHRAL RESECTION OF THE PROSTATE (TURP);  Surgeon: Jannifer Hick, MD;  Location: [facility];  Service: Urology;  Laterality: N/A;  75 MINUTES NEEDED FOR CASE   WIDE EXCISION MALIGNANT MELANOMA LEFT FOREARM/  LEFT AXILLA LYMPH NODE BX  09/10/2004   Patient Active Problem List   Diagnosis Date Noted   BPH (benign prostatic hyperplasia) 09/21/2022   Parkinson's disease (HCC) 04/13/2017   Benign prostatic hyperplasia with urinary retention 09/30/2015    ONSET DATE: 07/13/2023  REFERRING DIAG: G20.B1 (ICD-10-CM) - Parkinson's disease with dyskinesia, without mention of fluctuations   THERAPY DIAG:  Unsteadiness on feet  Other symptoms and signs involving the nervous system  Abnormal posture  Other lack of coordination  Rationale for Evaluation and Treatment: Rehabilitation  SUBJECTIVE:  SUBJECTIVE STATEMENT: Reports certain times will feel like he is walking sideways. Not all the time, just feels it periodically. Getting out of a chair is tough. Notes the sofa engulfs him when he sits down. Even trying his PWR moves when trying to get up is tough. Have had a few freezing episodes where it feels difficult to move. No falls. Thinks he is having more tremors in the right side. Worked on some strategies recently with OT. Sometimes reports dizziness, esp in the summer when playing golf, had it when playing golf. Would feel lightheaded. Saw Dr. Arbutus Leas recently and discussed the dizziness and wanted him to discuss with PCP. Doing tai chi, spin class, and PWR moves class.   Pt accompanied by: self  PERTINENT HISTORY: PD (Diagnosed 2018), HTN, HLD, OA,  h/o melanoma, PVD   PAIN:  Are you having pain? No  Vitals:   07/29/23 1033  BP: (!) 146/54  Pulse: (!) 47   Pt reports his pulse is normally low   PRECAUTIONS: None  RED FLAGS: None   WEIGHT BEARING RESTRICTIONS: No  FALLS: Has patient fallen in last 6 months? No and Reports some stumbles   LIVING ENVIRONMENT: Lives with: lives with their spouse Lives in: 2 story home, bedroom/full bath on first floor. 3 steps to enter  Has following equipment at home: Grab bars  PLOF: Independent  PATIENT GOALS: Wants to be able to walk and not look like he's drunk   OBJECTIVE:  Note: Objective measures were completed at Evaluation unless otherwise noted.  COGNITION: Overall cognitive status: Within functional limits for tasks assessed   SENSATION: Light touch: WFL  COORDINATION: Heel to shin: WNL   EDEMA:  Reports ankles are normally a little swollen (PCP aware), wears compression socks    POSTURE: rounded shoulders and forward head   LOWER EXTREMITY MMT:    MMT Right Eval Left Eval  Hip flexion 5 5  Hip extension    Hip abduction    Hip adduction    Hip internal rotation    Hip external rotation    Knee flexion 4+ 4+  Knee extension 4+ 4+  Ankle dorsiflexion 5 5  Ankle plantarflexion    Ankle inversion    Ankle eversion    (Blank rows = not tested)  BED MOBILITY:  Pt reports some difficulties with getting in and out of the bed and scooting (pt reports the PWR move helps with the scooting on a firm mattress). Reports when it is softer.  TRANSFERS: Assistive device utilized: None  Sit to stand: SBA Stand to sit: SBA Performs with no UE support    GAIT: Gait pattern: step through pattern, decreased arm swing- Right, decreased arm swing- Left, decreased stride length, and narrow BOS Distance walked: Clinic distances  Assistive device utilized: None Level of assistance: Modified independence Comments: Pt with tendency to put hands in his pockets  instead of performing arm swing.   FUNCTIONAL TESTS:  5 times sit to stand: 12 seconds with no UE support  10 meter walk test: 9.75 seconds = 3.36 ft/sec      [facility] PT Assessment - 07/29/23 1036       Standardized Balance Assessment   Standardized Balance Assessment Mini-BESTest;Timed Up and Go Test      Mini-BESTest   Sit To Stand Normal: Comes to stand without use of hands and stabilizes independently.    Rise to Toes Moderate: Heels up, but not full range (smaller than when holding hands), OR noticeable  instability for 3 s.    Stand on one leg (left) Moderate: < 20 s   ~2 seconds   Stand on one leg (right) Moderate: < 20 s   8-9 seconds   Stand on one leg - lowest score 1    Compensatory Stepping Correction - Forward Normal: Recovers independently with a single, large step (second realignement is allowed).    Compensatory Stepping Correction - Backward Moderate: More than one step is required to recover equilibrium   1-2 steps   Compensatory Stepping Correction - Left Lateral Moderate: Several steps to recover equilibrium   1-2 steps   Compensatory Stepping Correction - Right Lateral Moderate: Several steps to recover equilibrium   1-2 steps   Stepping Corredtion Lateral - lowest score 1    Stance - Feet together, eyes open, firm surface  Normal: 30s    Stance - Feet together, eyes closed, foam surface  Normal: 30s    Incline - Eyes Closed Normal: Stands independently 30s and aligns with gravity    Change in Gait Speed Normal: Significantly changes walkling speed without imbalance    Walk with head turns - Horizontal Moderate: performs head turns with reduction in gait speed.    Walk with pivot turns Normal: Turns with feet close FAST (< 3 steps) with good balance.    Step over obstacles Moderate: Steps over box but touches box OR displays cautious behavior by slowing gait.    Timed UP & GO with Dual Task Moderate: Dual Task affects either counting OR walking (>10%) when compared to  the TUG without Dual Task.    Mini-BEST total score 21      Timed Up and Go Test   Normal TUG (seconds) 7.9    Cognitive TUG (seconds) 12.1    TUG Comments Scores >10% difference indicate increased fall risk/difficulty with dual tasking              TODAY'S TREATMENT:                                                                                                                              N/A during eval   PATIENT EDUCATION: Education details: Clinical findings, POC, areas to work on in therapy Person educated: Patient Education method: Explanation Education comprehension: verbalized understanding  HOME EXERCISE PROGRAM: Will provide at future session for balance  GOALS: Goals reviewed with patient? Yes  SHORT TERM GOALS: ALL STGS = LTGS   LONG TERM GOALS: Target date: 09/10/2023  Pt will be independent with HEP for Parkinson's -specific exercises to improve functional mobility, transfers, and balance.  Baseline:  Goal status: INITIAL  2.  Pt will perform at least 8 of 10 reps of sit<>stand transfers from surfaces <18" without UE support, independently, for improved functional strength with transfers from lower surfaces Baseline:  Goal status: INITIAL  3.  Pt will improve miniBEST to at least a 24/28 in order to demo decr fall risk.  Baseline: 21/28 Goal status: INITIAL  4.  Pt will improve TUG and TUG cog score to less than or equal to 10% difference for improved dual task/decreased fall risk.  Baseline: TUG: 7.9, cog TUG: 12.1 Goal status: INITIAL   ASSESSMENT:  CLINICAL IMPRESSION: Patient is a 82 year old male referred to Neuro OPPT for Parkinson's Disease.   Pt's PMH is significant for: PD (Diagnosed 2018), HTN, HLD, OA, h/o melanoma, PVD. The following deficits were present during the exam: impaired timing/coordination of gait, decr functional strength, impaired balance, bradykinesia, postural abnormalities. Based on miniBEST, pt is an incr risk for falls.  Pt would benefit from skilled PT to address these impairments and functional limitations to maximize functional mobility independence and improve balance/decr fall risk.    OBJECTIVE IMPAIRMENTS: Abnormal gait, decreased activity tolerance, decreased balance, decreased coordination, decreased strength, and postural dysfunction.   ACTIVITY LIMITATIONS: locomotion level  PARTICIPATION LIMITATIONS: community activity  PERSONAL FACTORS: Age, Behavior pattern, Past/current experiences, Time since onset of injury/illness/exacerbation, and 3+ comorbidities: PD (Diagnosed 2018), HTN, HLD, OA, h/o melanoma, PVD   are also affecting patient's functional outcome.   REHAB POTENTIAL: Good  CLINICAL DECISION MAKING: Stable/uncomplicated  EVALUATION COMPLEXITY: Low  PLAN:  PT FREQUENCY: 2x/week  PT DURATION: 8 weeks  PLANNED INTERVENTIONS: 97164- PT Re-evaluation, 97110-Therapeutic exercises, 97530- Therapeutic activity, 97112- Neuromuscular re-education, 97535- Self Care, 98119- Manual therapy, [phone]- Gait training, Patient/Family education, Balance training, Stair training, and Vestibular training  PLAN FOR NEXT SESSION: Work on gait with arm swing, add to HEP for balance - EC, gait with head turns, SLS, obstacles, stepping in the posterior and lateral directions    Drake Leach, PT, DPT 07/30/2023, 12:15 PM

## 2023-07-30 ENCOUNTER — Ambulatory Visit (INDEPENDENT_AMBULATORY_CARE_PROVIDER_SITE_OTHER): Payer: No Typology Code available for payment source | Admitting: Neurology

## 2023-07-30 DIAGNOSIS — K117 Disturbances of salivary secretion: Secondary | ICD-10-CM | POA: Diagnosis not present

## 2023-07-30 MED ORDER — RIMABOTULINUMTOXINB 5000 UNIT/ML IM SOLN
5000.0000 [IU] | Freq: Once | INTRAMUSCULAR | Status: AC
  Administered 2023-07-30: 5000 [IU] via INTRAMUSCULAR

## 2023-07-30 NOTE — Procedures (Signed)
[facility]um [facility]    History:  Diagnosis: Sialorrhea    Result History  Doing well   Consent obtained from: The patient The patient was educated on the botulinum toxin the black blox warning and given a copy of the botox patient medication guide.  The patient understands that this warning states that there have been reported cases of the Botox extending beyond the injection site and creating adverse effects, similar to those of botulism. This included loss of strength, trouble walking, hoarseness, trouble saying words clearly, loss of bladder control, trouble breathing, trouble swallowing, diplopia, blurry vision and ptosis. Most of the distant spread of Botox was happening in patients, primarily children, who received medication for spasticity or for cervical dystonia. The patient expressed understanding and desire to proceed.     Injections  Location Left  Right Units Number of sites  Submandibular gland 250 250 500 1 per side  Parotid 2250 2250 2500 1 per side  TOTAL UNITS:     5000      Type of Toxin: Myobloc type B As ordered and injected IM at today's visit Total Units: 5000  Discarded Units: 0  Needle drawback with each injection was free of blood. Pt tolerated procedure well without complications.   Reinjection is anticipated in 3 months.

## 2023-08-17 ENCOUNTER — Encounter: Payer: Self-pay | Admitting: Neurology

## 2023-08-24 ENCOUNTER — Encounter: Payer: Self-pay | Admitting: Physical Therapy

## 2023-08-24 ENCOUNTER — Ambulatory Visit: Payer: No Typology Code available for payment source | Attending: Internal Medicine | Admitting: Physical Therapy

## 2023-08-24 DIAGNOSIS — R293 Abnormal posture: Secondary | ICD-10-CM | POA: Insufficient documentation

## 2023-08-24 DIAGNOSIS — R29818 Other symptoms and signs involving the nervous system: Secondary | ICD-10-CM | POA: Diagnosis present

## 2023-08-24 DIAGNOSIS — R278 Other lack of coordination: Secondary | ICD-10-CM | POA: Insufficient documentation

## 2023-08-24 DIAGNOSIS — R2681 Unsteadiness on feet: Secondary | ICD-10-CM | POA: Insufficient documentation

## 2023-08-24 NOTE — Therapy (Signed)
 OUTPATIENT PHYSICAL THERAPY NEURO TREATMENT   Patient Name: Zachary Wang. MRN: [id number] DOB:[birth year], 83 y.o., male Today's Date: 08/24/2023   PCP: Janey Santos  REFERRING PROVIDER: Christiana Gun, MD (from [place]) - will send POC to PCP   END OF SESSION:  PT End of Session - 08/24/23 0933     Visit Number 2    Number of Visits 9    Date for PT Re-Evaluation 09/28/23   due to delay in scheduling   Authorization Type VA, visit limit 15    PT Start Time 0931    PT Stop Time 1012    PT Time Calculation (min) 41 min    Equipment Utilized During Treatment --    Activity Tolerance Patient tolerated treatment well    Behavior During Therapy [facility] for tasks assessed/performed             Past Medical History:  Diagnosis Date   Arthritis    hands   BPH (benign prostatic hyperplasia)    Cancer (HCC)    skin cancer   Elevated PSA    Foley catheter in place    History of adenomatous polyp of colon    2014  tubular adenoma   History of malignant melanoma    s/p  excision left forearm and left axill lymph node bx 09-10-2004   Hyperlipidemia    Hypertension    Peripheral vascular disease (HCC)    Urinary retention    Past Surgical History:  Procedure Laterality Date   ANAL FISSURE REPAIR  08/17/1982   COLONOSCOPY N/A 05/02/2013   Procedure: COLONOSCOPY;  Surgeon: Gladis MARLA Louder, MD;  Location: [facility];  Service: Endoscopy;  Laterality: N/A;   INSERTION OF SUPRAPUBIC CATHETER N/A 09/30/2015   Procedure: INSERTION OF SUPRAPUBIC CATHETER;  Surgeon: Mark Ottelin, MD;  [facility]: [facility];  Service: Urology;  Laterality: N/A;   SHOULDER ARTHROSCOPY W/ SUBACROMIAL DECOMPRESSION AND DISTAL CLAVICLE EXCISION Left 05/20/2010   w/  Debridement , Bursectomy, Acromioplasty,  Capsulectomy   TONSILLECTOMY     TRANSURETHRAL RESECTION OF PROSTATE N/A 09/30/2015   Procedure: TURP (TRANSURETHRAL RESECTION OF PROSTATE WITH GYRUS;  Surgeon: Mark Ottelin, MD;   Location: [facility];  Service: Urology;  Laterality: N/A;   TRANSURETHRAL RESECTION OF PROSTATE N/A 09/21/2022   Procedure: BIPOLAR TRANSURETHRAL RESECTION OF THE PROSTATE (TURP);  Surgeon: Selma Donnice SAUNDERS, MD;  Location: [facility];  Service: Urology;  Laterality: N/A;  75 MINUTES NEEDED FOR CASE   WIDE EXCISION MALIGNANT MELANOMA LEFT FOREARM/  LEFT AXILLA LYMPH NODE BX  09/10/2004   Patient Active Problem List   Diagnosis Date Noted   BPH (benign prostatic hyperplasia) 09/21/2022   Parkinson's disease (HCC) 04/13/2017   Benign prostatic hyperplasia with urinary retention 09/30/2015    ONSET DATE: 07/13/2023  REFERRING DIAG: G20.B1 (ICD-10-CM) - Parkinson's disease with dyskinesia, without mention of fluctuations   THERAPY DIAG:  Unsteadiness on feet  Other symptoms and signs involving the nervous system  Other lack of coordination  Rationale for Evaluation and Treatment: Rehabilitation  SUBJECTIVE:  SUBJECTIVE STATEMENT: No changes, no falls. Still feeling off balance.   Pt accompanied by: self  PERTINENT HISTORY: PD (Diagnosed 2018), HTN, HLD, OA, h/o melanoma, PVD   PAIN:  Are you having pain? No  There were no vitals filed for this visit.   PRECAUTIONS: None  RED FLAGS: None   WEIGHT BEARING RESTRICTIONS: No  FALLS: Has patient fallen in last 6 months? No and Reports some stumbles   LIVING ENVIRONMENT: Lives with: lives with their spouse Lives in: 2 story home, bedroom/full bath on first floor. 3 steps to enter  Has following equipment at home: Grab bars  PLOF: Independent  PATIENT GOALS: Wants to be able to walk and not look like he's drunk   OBJECTIVE:  Note: Objective measures were completed at Evaluation unless otherwise noted.  COGNITION: Overall  cognitive status: Within functional limits for tasks assessed   SENSATION: Light touch: WFL  COORDINATION: Heel to shin: WNL   EDEMA:  Reports ankles are normally a little swollen (PCP aware), wears compression socks    POSTURE: rounded shoulders and forward head   LOWER EXTREMITY MMT:    MMT Right Eval Left Eval  Hip flexion 5 5  Hip extension    Hip abduction    Hip adduction    Hip internal rotation    Hip external rotation    Knee flexion 4+ 4+  Knee extension 4+ 4+  Ankle dorsiflexion 5 5  Ankle plantarflexion    Ankle inversion    Ankle eversion    (Blank rows = not tested)  BED MOBILITY:  Pt reports some difficulties with getting in and out of the bed and scooting (pt reports the PWR move helps with the scooting on a firm mattress). Reports when it is softer.  TRANSFERS: Assistive device utilized: None  Sit to stand: SBA Stand to sit: SBA Performs with no UE support    GAIT: Gait pattern: step through pattern, decreased arm swing- Right, decreased arm swing- Left, decreased stride length, and narrow BOS Distance walked: Clinic distances  Assistive device utilized: None Level of assistance: Modified independence Comments: Pt with tendency to put hands in his pockets instead of performing arm swing.   FUNCTIONAL TESTS:  5 times sit to stand: 12 seconds with no UE support  10 meter walk test: 9.75 seconds = 3.36 ft/sec    TODAY'S TREATMENT:                                                                                                                               Therapeutic Exercise:  SciFit with BUE/BLE Multi-Peaks Gear 4.0 for 8 minutes for neural priming, reciprocal movements, aerobic warm-up. Pt reporting RPE as 7/10. Pt able to keep spm in the 80s and 90s throughout  NMR:  Initiated HEP for balance:   Access Code: [email or web address] URL: [email or web address] Date: 08/24/2023 Prepared by: Sheffield Senate  Exercises - Romberg  Stance Eyes Closed on Foam Pad  -  1 x daily - 4-5 x weekly - 3 sets - 30 hold - Romberg Stance on Foam Pad with Head Rotation  - 1 x daily - 4-5 x weekly - 2 sets - 10 reps - performed 2 x 10 reps head nods, 2 x 10 reps head turns, with incr postural sway  - Walking March  - 1 x daily - 4-5 x weekly - 3 sets - performed as slow SLS next to countertop with trying to hold for a few seconds, progressed to reciprocal UE lifts, pt needing intermittent UE support for balance   Multi-directional stepping sheet: Following directions with arms movements out to the side in forward/sideways direction and bringing arms forward when stepping posteriorly  Progressed challenge by doing the opposite of R/L or doing the opposite of forwards/backwards   PATIENT EDUCATION: Education details: Initial HEP for balance  Person educated: Patient Education method: Explanation, Demonstration, Verbal cues, and Handouts Education comprehension: verbalized understanding and returned demonstration  HOME EXERCISE PROGRAM: Multi-directional stepping sheet  Access Code: [email or web address] URL: [email or web address] Date: 08/24/2023 Prepared by: Sheffield Senate  Exercises - Romberg Stance Eyes Closed on Foam Pad  - 1 x daily - 4-5 x weekly - 3 sets - 30 hold - Romberg Stance on Foam Pad with Head Rotation  - 1 x daily - 4-5 x weekly - 2 sets - 10 reps - Walking March  - 1 x daily - 4-5 x weekly - 3 sets   GOALS: Goals reviewed with patient? Yes  SHORT TERM GOALS: ALL STGS = LTGS   LONG TERM GOALS: Target date: 09/10/2023  Pt will be independent with HEP for Parkinson's -specific exercises to improve functional mobility, transfers, and balance.  Baseline:  Goal status: INITIAL  2.  Pt will perform at least 8 of 10 reps of sit<>stand transfers from surfaces <18 without UE support, independently, for improved functional strength with transfers from lower surfaces Baseline:  Goal status: INITIAL  3.  Pt  will improve miniBEST to at least a 24/28 in order to demo decr fall risk. Baseline: 21/28 Goal status: INITIAL  4.  Pt will improve TUG and TUG cog score to less than or equal to 10% difference for improved dual task/decreased fall risk.  Baseline: TUG: 7.9, cog TUG: 12.1 Goal status: INITIAL   ASSESSMENT:  CLINICAL IMPRESSION: Today's skilled session focused on warm-up on SciFit with focus on reciprocal movements/neural priming, and initiating HEP for balance. Added to HEP for SLS, balance on unlevel surfaces (head motions, EC), and working on multi-directional stepping. Pt challenged by SLS tasks, esp with addition of reciprocal UE arm movements. Will continue per POC.    OBJECTIVE IMPAIRMENTS: Abnormal gait, decreased activity tolerance, decreased balance, decreased coordination, decreased strength, and postural dysfunction.   ACTIVITY LIMITATIONS: locomotion level  PARTICIPATION LIMITATIONS: community activity  PERSONAL FACTORS: Age, Behavior pattern, Past/current experiences, Time since onset of injury/illness/exacerbation, and 3+ comorbidities: PD (Diagnosed 2018), HTN, HLD, OA, h/o melanoma, PVD   are also affecting patient's functional outcome.   REHAB POTENTIAL: Good  CLINICAL DECISION MAKING: Stable/uncomplicated  EVALUATION COMPLEXITY: Low  PLAN:  PT FREQUENCY: 2x/week  PT DURATION: 8 weeks  PLANNED INTERVENTIONS: 97164- PT Re-evaluation, 97110-Therapeutic exercises, 97530- Therapeutic activity, 97112- Neuromuscular re-education, 97535- Self Care, [id number]- Manual therapy, [phone]- Gait training, Patient/Family education, Balance training, Stair training, and Vestibular training  PLAN FOR NEXT SESSION: Work on gait with arm swing, work on balance - EC, gait with head turns, SLS, obstacles, stepping in the posterior and  lateral directions. Reciprocal movements and arm swing tasks     Sheffield LOISE Senate, PT, DPT 08/24/2023, 10:14 AM

## 2023-08-27 ENCOUNTER — Ambulatory Visit: Payer: No Typology Code available for payment source | Admitting: Physical Therapy

## 2023-08-27 ENCOUNTER — Encounter: Payer: Self-pay | Admitting: Physical Therapy

## 2023-08-27 DIAGNOSIS — R29818 Other symptoms and signs involving the nervous system: Secondary | ICD-10-CM

## 2023-08-27 DIAGNOSIS — R2681 Unsteadiness on feet: Secondary | ICD-10-CM

## 2023-08-27 DIAGNOSIS — R293 Abnormal posture: Secondary | ICD-10-CM

## 2023-08-27 NOTE — Therapy (Signed)
 OUTPATIENT PHYSICAL THERAPY NEURO TREATMENT   Patient Name: Zachary Wang. MRN: [id number] DOB:07-04-1941, 83 y.o., male Today's Date: 08/27/2023   PCP: Janey Santos  REFERRING PROVIDER: Christiana Gun, MD (from [place]) - will send POC to PCP   END OF SESSION:  PT End of Session - 08/27/23 0849     Visit Number 3    Number of Visits 9    Date for PT Re-Evaluation 09/28/23   due to delay in scheduling   Authorization Type VA, visit limit 15    PT Start Time 0846    PT Stop Time 0928    PT Time Calculation (min) 42 min    Equipment Utilized During Treatment Gait belt    Activity Tolerance Patient tolerated treatment well    Behavior During Therapy WFL for tasks assessed/performed             Past Medical History:  Diagnosis Date   Arthritis    hands   BPH (benign prostatic hyperplasia)    Cancer (HCC)    skin cancer   Elevated PSA    Foley catheter in place    History of adenomatous polyp of colon    2014  tubular adenoma   History of malignant melanoma    s/p  excision left forearm and left axill lymph node bx 09-10-2004   Hyperlipidemia    Hypertension    Peripheral vascular disease (HCC)    Urinary retention    Past Surgical History:  Procedure Laterality Date   ANAL FISSURE REPAIR  08/17/1982   COLONOSCOPY N/A 05/02/2013   Procedure: COLONOSCOPY;  Surgeon: Gladis MARLA Louder, MD;  Location: [facility];  Service: Endoscopy;  Laterality: N/A;   INSERTION OF SUPRAPUBIC CATHETER N/A 09/30/2015   Procedure: INSERTION OF SUPRAPUBIC CATHETER;  Surgeon: Mark Ottelin, MD;  Location: [facility];  Service: Urology;  Laterality: N/A;   SHOULDER ARTHROSCOPY W/ SUBACROMIAL DECOMPRESSION AND DISTAL CLAVICLE EXCISION Left 05/20/2010   w/  Debridement , Bursectomy, Acromioplasty,  Capsulectomy   TONSILLECTOMY     TRANSURETHRAL RESECTION OF PROSTATE N/A 09/30/2015   Procedure: TURP (TRANSURETHRAL RESECTION OF PROSTATE WITH GYRUS;  Surgeon: Mark Ottelin,  MD;  Location[facility] ;  Service: Urology;  Laterality: N/A;   TRANSURETHRAL RESECTION OF PROSTATE N/A 09/21/2022   Procedure: BIPOLAR TRANSURETHRAL RESECTION OF THE PROSTATE (TURP);  Surgeon: Selma Donnice SAUNDERS, MD;  Location: [facility];  Service: Urology;  Laterality: N/A;  75 MINUTES NEEDED FOR CASE   WIDE EXCISION MALIGNANT MELANOMA LEFT FOREARM/  LEFT AXILLA LYMPH NODE BX  09/10/2004   Patient Active Problem List   Diagnosis Date Noted   BPH (benign prostatic hyperplasia) 09/21/2022   Parkinson's disease (HCC) 04/13/2017   Benign prostatic hyperplasia with urinary retention 09/30/2015    ONSET DATE: 07/13/2023  REFERRING DIAG: G20.B1 (ICD-10-CM) - Parkinson's disease with dyskinesia, without mention of fluctuations   THERAPY DIAG:  Unsteadiness on feet  Other symptoms and signs involving the nervous system  Abnormal posture  Rationale for Evaluation and Treatment: Rehabilitation  SUBJECTIVE:  SUBJECTIVE STATEMENT: No changes. Brought in his binder that has all his exercises in it.   Pt accompanied by: self  PERTINENT HISTORY: PD (Diagnosed 2018), HTN, HLD, OA, h/o melanoma, PVD   PAIN:  Are you having pain? No  There were no vitals filed for this visit.   PRECAUTIONS: None  RED FLAGS: None   WEIGHT BEARING RESTRICTIONS: No  FALLS: Has patient fallen in last 6 months? No and Reports some stumbles   LIVING ENVIRONMENT: Lives with: lives with their spouse Lives in: 2 story home, bedroom/full bath on first floor. 3 steps to enter  Has following equipment at home: Grab bars  PLOF: Independent  PATIENT GOALS: Wants to be able to walk and not look like he's drunk   OBJECTIVE:  Note: Objective measures were completed at Evaluation unless otherwise  noted.  COGNITION: Overall cognitive status: Within functional limits for tasks assessed   SENSATION: Light touch: WFL  COORDINATION: Heel to shin: WNL   EDEMA:  Reports ankles are normally a little swollen (PCP aware), wears compression socks    POSTURE: rounded shoulders and forward head   LOWER EXTREMITY MMT:    MMT Right Eval Left Eval  Hip flexion 5 5  Hip extension    Hip abduction    Hip adduction    Hip internal rotation    Hip external rotation    Knee flexion 4+ 4+  Knee extension 4+ 4+  Ankle dorsiflexion 5 5  Ankle plantarflexion    Ankle inversion    Ankle eversion    (Blank rows = not tested)  BED MOBILITY:  Pt reports some difficulties with getting in and out of the bed and scooting (pt reports the PWR move helps with the scooting on a firm mattress). Reports when it is softer.  TRANSFERS: Assistive device utilized: None  Sit to stand: SBA Stand to sit: SBA Performs with no UE support    GAIT: Gait pattern: step through pattern, decreased arm swing- Right, decreased arm swing- Left, decreased stride length, and narrow BOS Distance walked: Clinic distances  Assistive device utilized: None Level of assistance: Modified independence Comments: Pt with tendency to put hands in his pockets instead of performing arm swing.   FUNCTIONAL TESTS:  5 times sit to stand: 12 seconds with no UE support  10 meter walk test: 9.75 seconds = 3.36 ft/sec    TODAY'S TREATMENT:                                                                                                                                NMR:  Pt performs PWR! Moves in standing position x 20 reps on blue foam beam, performed with chair or countertop in front of pt for intermittent UE support as needed for balance    PWR! Up for improved posture  PWR! Rock for improved weighshifting  PWR! Twist for improved trunk rotation   PWR! Step for improved step  initiation   Cues provided for  large amplitude movements and opening up hands.    On blue foam beam: Side stepping and alternating SLS taps to 2 cones down and back x1 rep, added cognitive dual tasking with naming foods in alphabetical order down and back x2 reps, pt performing more slowly with cog dual task  On air ex:  3/4 tandem with ball toss 2 x 10 reps, pt more challenged with balance with LLE posteriorly, needing intermittent taps to the wall for balance   At stair case: alternating step ups with contralateral march x10 reps, then an additional 10 reps each side with arm raise overhead with cues for posture and looking straight ahead  Modified SLS with one foot on step and performing diagonals trunk rotation with holding yellow 2 kg ball 10 reps each side   PATIENT EDUCATION: Education details: Continue HEP Person educated: Patient Education method: Explanation Education comprehension: verbalized understanding and returned demonstration  HOME EXERCISE PROGRAM: Multi-directional stepping sheet  Access Code: [email or web address] URL: [email or web address] Date: 08/24/2023 Prepared by: Sheffield Senate  Exercises - Romberg Stance Eyes Closed on Foam Pad  - 1 x daily - 4-5 x weekly - 3 sets - 30 hold - Romberg Stance on Foam Pad with Head Rotation  - 1 x daily - 4-5 x weekly - 2 sets - 10 reps - Walking March  - 1 x daily - 4-5 x weekly - 3 sets   GOALS: Goals reviewed with patient? Yes  SHORT TERM GOALS: ALL STGS = LTGS   LONG TERM GOALS: Target date: 09/10/2023  Pt will be independent with HEP for Parkinson's -specific exercises to improve functional mobility, transfers, and balance.  Baseline:  Goal status: INITIAL  2.  Pt will perform at least 8 of 10 reps of sit<>stand transfers from surfaces <18 without UE support, independently, for improved functional strength with transfers from lower surfaces Baseline:  Goal status: INITIAL  3.  Pt will improve miniBEST to at least a 24/28 in order to  demo decr fall risk. Baseline: 21/28 Goal status: INITIAL  4.  Pt will improve TUG and TUG cog score to less than or equal to 10% difference for improved dual task/decreased fall risk.  Baseline: TUG: 7.9, cog TUG: 12.1 Goal status: INITIAL   ASSESSMENT:  CLINICAL IMPRESSION: Today's skilled session focused on balance strategies with compliant surfaces, large amplitude movements, and SLS tasks. Pt more challenged with balance when adding in a cognitive dual task with side stepping and toe taps to cones. Pt performing more slowly and having difficulty at times thinking of an object. Will continue per POC.    OBJECTIVE IMPAIRMENTS: Abnormal gait, decreased activity tolerance, decreased balance, decreased coordination, decreased strength, and postural dysfunction.   ACTIVITY LIMITATIONS: locomotion level  PARTICIPATION LIMITATIONS: community activity  PERSONAL FACTORS: Age, Behavior pattern, Past/current experiences, Time since onset of injury/illness/exacerbation, and 3+ comorbidities: PD (Diagnosed 2018), HTN, HLD, OA, h/o melanoma, PVD   are also affecting patient's functional outcome.   REHAB POTENTIAL: Good  CLINICAL DECISION MAKING: Stable/uncomplicated  EVALUATION COMPLEXITY: Low  PLAN:  PT FREQUENCY: 2x/week  PT DURATION: 8 weeks  PLANNED INTERVENTIONS: 97164- PT Re-evaluation, 97110-Therapeutic exercises, 97530- Therapeutic activity, 97112- Neuromuscular re-education, 97535- Self Care, 02859- Manual therapy, [phone]- Gait training, Patient/Family education, Balance training, Stair training, and Vestibular training  PLAN FOR NEXT SESSION: Work on gait with arm swing, work on balance - EC, gait with head turns, SLS, obstacles, stepping in the posterior and lateral directions. Reciprocal  movements and arm swing tasks     Sheffield LOISE Senate, PT, DPT 08/27/2023, 9:33 AM

## 2023-08-31 ENCOUNTER — Encounter: Payer: Self-pay | Admitting: Physical Therapy

## 2023-08-31 ENCOUNTER — Ambulatory Visit: Payer: No Typology Code available for payment source | Admitting: Physical Therapy

## 2023-08-31 DIAGNOSIS — R278 Other lack of coordination: Secondary | ICD-10-CM

## 2023-08-31 DIAGNOSIS — R293 Abnormal posture: Secondary | ICD-10-CM

## 2023-08-31 DIAGNOSIS — R2681 Unsteadiness on feet: Secondary | ICD-10-CM

## 2023-08-31 DIAGNOSIS — R29818 Other symptoms and signs involving the nervous system: Secondary | ICD-10-CM

## 2023-08-31 NOTE — Therapy (Signed)
 OUTPATIENT PHYSICAL THERAPY NEURO TREATMENT   Patient Name: Zachary Wang. MRN: [id number] DOB:1940/08/30, 83 y.o., male Today's Date: 08/31/2023   PCP: Janey Santos  REFERRING PROVIDER: Christiana Gun, MD (from [place]) - will send POC to PCP   END OF SESSION:  PT End of Session - 08/31/23 0930     Visit Number 4    Number of Visits 9    Date for PT Re-Evaluation 09/28/23   due to delay in scheduling   Authorization Type VA, visit limit 15    PT Start Time 0930    PT Stop Time 1012    PT Time Calculation (min) 42 min    Equipment Utilized During Treatment Gait belt    Activity Tolerance Patient tolerated treatment well    Behavior During Therapy WFL for tasks assessed/performed             Past Medical History:  Diagnosis Date   Arthritis    hands   BPH (benign prostatic hyperplasia)    Cancer (HCC)    skin cancer   Elevated PSA    Foley catheter in place    History of adenomatous polyp of colon    2014  tubular adenoma   History of malignant melanoma    s/p  excision left forearm and left axill lymph node bx 09-10-2004   Hyperlipidemia    Hypertension    Peripheral vascular disease (HCC)    Urinary retention    Past Surgical History:  Procedure Laterality Date   ANAL FISSURE REPAIR  08/17/1982   COLONOSCOPY N/A 05/02/2013   Procedure: COLONOSCOPY;  Surgeon: Gladis MARLA Louder, MD;  Location: [facility];  Service: Endoscopy;  Laterality: N/A;   INSERTION OF SUPRAPUBIC CATHETER N/A 09/30/2015   Procedure: INSERTION OF SUPRAPUBIC CATHETER;  Surgeon: Mark Ottelin, MD;  Location: [facility];  Service: Urology;  Laterality: N/A;   SHOULDER ARTHROSCOPY W/ SUBACROMIAL DECOMPRESSION AND DISTAL CLAVICLE EXCISION Left 05/20/2010   w/  Debridement , Bursectomy, Acromioplasty,  Capsulectomy   TONSILLECTOMY     TRANSURETHRAL RESECTION OF PROSTATE N/A 09/30/2015   Procedure: TURP (TRANSURETHRAL RESECTION OF PROSTATE WITH GYRUS;  Surgeon: Mark Ottelin,  MD;  Location: [facility];  Service: Urology;  Laterality: N/A;   TRANSURETHRAL RESECTION OF PROSTATE N/A 09/21/2022   Procedure: BIPOLAR TRANSURETHRAL RESECTION OF THE PROSTATE (TURP);  Surgeon: Selma Donnice SAUNDERS, MD;  Location: [facility];  Service: Urology;  Laterality: N/A;  75 MINUTES NEEDED FOR CASE   WIDE EXCISION MALIGNANT MELANOMA LEFT FOREARM/  LEFT AXILLA LYMPH NODE BX  09/10/2004   Patient Active Problem List   Diagnosis Date Noted   BPH (benign prostatic hyperplasia) 09/21/2022   Parkinson's disease (HCC) 04/13/2017   Benign prostatic hyperplasia with urinary retention 09/30/2015    ONSET DATE: 07/13/2023  REFERRING DIAG: G20.B1 (ICD-10-CM) - Parkinson's disease with dyskinesia, without mention of fluctuations   THERAPY DIAG:  Unsteadiness on feet  Other symptoms and signs involving the nervous system  Abnormal posture  Other lack of coordination  Rationale for Evaluation and Treatment: Rehabilitation  SUBJECTIVE:  SUBJECTIVE STATEMENT: No changes. No falls.   Pt accompanied by: self  PERTINENT HISTORY: PD (Diagnosed 2018), HTN, HLD, OA, h/o melanoma, PVD   PAIN:  Are you having pain? No  There were no vitals filed for this visit.   PRECAUTIONS: None  RED FLAGS: None   WEIGHT BEARING RESTRICTIONS: No  FALLS: Has patient fallen in last 6 months? No and Reports some stumbles   LIVING ENVIRONMENT: Lives with: lives with their spouse Lives in: 2 story home, bedroom/full bath on first floor. 3 steps to enter  Has following equipment at home: Grab bars  PLOF: Independent  PATIENT GOALS: Wants to be able to walk and not look like he's drunk   OBJECTIVE:  Note: Objective measures were completed at Evaluation unless otherwise noted.  COGNITION: Overall  cognitive status: Within functional limits for tasks assessed   SENSATION: Light touch: WFL  COORDINATION: Heel to shin: WNL   EDEMA:  Reports ankles are normally a little swollen (PCP aware), wears compression socks    POSTURE: rounded shoulders and forward head   LOWER EXTREMITY MMT:    MMT Right Eval Left Eval  Hip flexion 5 5  Hip extension    Hip abduction    Hip adduction    Hip internal rotation    Hip external rotation    Knee flexion 4+ 4+  Knee extension 4+ 4+  Ankle dorsiflexion 5 5  Ankle plantarflexion    Ankle inversion    Ankle eversion    (Blank rows = not tested)  BED MOBILITY:  Pt reports some difficulties with getting in and out of the bed and scooting (pt reports the PWR move helps with the scooting on a firm mattress). Reports when it is softer.  TRANSFERS: Assistive device utilized: None  Sit to stand: SBA Stand to sit: SBA Performs with no UE support  GAIT: Gait pattern: step through pattern, decreased arm swing- Right, decreased arm swing- Left, decreased stride length, and narrow BOS Distance walked: Clinic distances  Assistive device utilized: None Level of assistance: Modified independence Comments: Pt with tendency to put hands in his pockets instead of performing arm swing.    FUNCTIONAL TESTS:  5 times sit to stand: 12 seconds with no UE support  10 meter walk test: 9.75 seconds = 3.36 ft/sec    TODAY'S TREATMENT:                                                                                                                                Therapeutic Exercise:  SciFit with BUE/BLE Multi-Peaks Gear 4.5 for 8 minutes for neural priming, reciprocal movements, aerobic warm-up. Pt reporting RPE as 8-9/10 during the hills. Pt able to keep spm in the 90s throughout.    NMR: With 5 blaze pods in a semi-circle on the mirror, working on visual scanning, trunk rotation, reaching outside of BOS, weight shifting, performed on random hit  setting and standing on  blue foam beam. Cued to open up hands big when going to tap pod Performed 2 bouts of 1 minute: 18 hits, 24 hits Performed 2 bouts of 1 minute: 16 hits, 12 hits (naming animals in alphabetical order for cognitive dual tasking, pt needing some cues at times on what letter he was at)   On level ground: Balancing on one leg for SLS, and tossing 4# medicine ball to floor and catching it 2 x 10 reps each side , pt needing to put foot down frequently to the floor, but did get better with incr reps. Pt more challenged with SLS on RLE  On blue mat: Staggered stance weight shift with legs only 10 reps each side, then adding in reciprocal UE movement with large movement patterns with cues for full shoulder extension and opening up hands  PWR Rock with contralateral leg lift for dynamic SLS 20 reps, trying to hold each side for a couple seconds  Forward stepping and performing trunk rotation over leg and tossing 4# medicine ball to floor and catching it 12 reps each side, CGA/min A for balance   PATIENT EDUCATION: Education details: Continue HEP, answered pt's question regarding DBS  Person educated: Patient Education method: Explanation Education comprehension: verbalized understanding and returned demonstration  HOME EXERCISE PROGRAM: Multi-directional stepping sheet  Access Code: [email or web address] URL: [email or web address] Date: 08/24/2023 Prepared by: Sheffield Senate  Exercises - Romberg Stance Eyes Closed on Foam Pad  - 1 x daily - 4-5 x weekly - 3 sets - 30 hold - Romberg Stance on Foam Pad with Head Rotation  - 1 x daily - 4-5 x weekly - 2 sets - 10 reps - Walking March  - 1 x daily - 4-5 x weekly - 3 sets   GOALS: Goals reviewed with patient? Yes  SHORT TERM GOALS: ALL STGS = LTGS   LONG TERM GOALS: Target date: 09/10/2023  Pt will be independent with HEP for Parkinson's -specific exercises to improve functional mobility, transfers, and balance.   Baseline:  Goal status: INITIAL  2.  Pt will perform at least 8 of 10 reps of sit<>stand transfers from surfaces <18 without UE support, independently, for improved functional strength with transfers from lower surfaces Baseline:  Goal status: INITIAL  3.  Pt will improve miniBEST to at least a 24/28 in order to demo decr fall risk. Baseline: 21/28 Goal status: INITIAL  4.  Pt will improve TUG and TUG cog score to less than or equal to 10% difference for improved dual task/decreased fall risk.  Baseline: TUG: 7.9, cog TUG: 12.1 Goal status: INITIAL   ASSESSMENT:  CLINICAL IMPRESSION: Today's skilled session focused on SciFit for reciprocal movement patterns/aerobic exercise, and balance strategies with compliant surfaces, SLS tasks, weight shifting, and dual tasking. Pt slower to perform blaze pod activity with adding in cognitive dual task with naming animals in alphabetical order. Pt more challenged with SLS tasks on RLE, but did demo improvement with incr reps. Will continue per POC.    OBJECTIVE IMPAIRMENTS: Abnormal gait, decreased activity tolerance, decreased balance, decreased coordination, decreased strength, and postural dysfunction.   ACTIVITY LIMITATIONS: locomotion level  PARTICIPATION LIMITATIONS: community activity  PERSONAL FACTORS: Age, Behavior pattern, Past/current experiences, Time since onset of injury/illness/exacerbation, and 3+ comorbidities: PD (Diagnosed 2018), HTN, HLD, OA, h/o melanoma, PVD   are also affecting patient's functional outcome.   REHAB POTENTIAL: Good  CLINICAL DECISION MAKING: Stable/uncomplicated  EVALUATION COMPLEXITY: Low  PLAN:  PT FREQUENCY: 2x/week  PT DURATION: 8 weeks  PLANNED INTERVENTIONS: 97164- PT Re-evaluation, 97110-Therapeutic exercises, 97530- Therapeutic activity, 97112- Neuromuscular re-education, 97535- Self Care, 02859- Manual therapy, [phone]- Gait training, Patient/Family education, Balance training, Stair  training, and Vestibular training  PLAN FOR NEXT SESSION: Work on gait with arm swing, work on balance - EC, gait with head turns, SLS, obstacles, stepping in the posterior and lateral directions. Reciprocal movements and arm swing tasks    Try rebounder and scarves   Sheffield LOISE Senate, PT, DPT 08/31/2023, 10:16 AM

## 2023-09-02 ENCOUNTER — Ambulatory Visit: Payer: No Typology Code available for payment source | Admitting: Physical Therapy

## 2023-09-02 ENCOUNTER — Encounter: Payer: Self-pay | Admitting: Physical Therapy

## 2023-09-02 DIAGNOSIS — R293 Abnormal posture: Secondary | ICD-10-CM

## 2023-09-02 DIAGNOSIS — R2681 Unsteadiness on feet: Secondary | ICD-10-CM | POA: Diagnosis not present

## 2023-09-02 DIAGNOSIS — R278 Other lack of coordination: Secondary | ICD-10-CM

## 2023-09-02 DIAGNOSIS — R29818 Other symptoms and signs involving the nervous system: Secondary | ICD-10-CM

## 2023-09-02 NOTE — Therapy (Signed)
OUTPATIENT PHYSICAL THERAPY NEURO TREATMENT   Patient Name: Zachary Wang. MRN: [id number] DOB:1941-05-16, 83 y.o., male Today's Date: 09/02/2023   PCP: Chilton Greathouse  REFERRING PROVIDER: Lenice Pressman, MD (from [place]) - will send POC to PCP   END OF SESSION:  PT End of Session - 09/02/23 0806     Visit Number 5    Number of Visits 9    Date for PT Re-Evaluation 09/28/23   due to delay in scheduling   Authorization Type VA, visit limit 15    PT Start Time 0805    PT Stop Time 0845    PT Time Calculation (min) 40 min    Equipment Utilized During Treatment Gait belt    Activity Tolerance Patient tolerated treatment well    Behavior During Therapy WFL for tasks assessed/performed             Past Medical History:  Diagnosis Date   Arthritis    hands   BPH (benign prostatic hyperplasia)    Cancer (HCC)    skin cancer   Elevated PSA    Foley catheter in place    History of adenomatous polyp of colon    2014  tubular adenoma   History of malignant melanoma    s/p  excision left forearm and left axill lymph node bx 09-10-2004   Hyperlipidemia    Hypertension    Peripheral vascular disease (HCC)    Urinary retention    Past Surgical History:  Procedure Laterality Date   ANAL FISSURE REPAIR  08/17/1982   COLONOSCOPY N/A 05/02/2013   Procedure: COLONOSCOPY;  Surgeon: Charolett Bumpers, MD;  Location: [facility];  Service: Endoscopy;  Laterality: N/A;   INSERTION OF SUPRAPUBIC CATHETER N/A 09/30/2015   Procedure: INSERTION OF SUPRAPUBIC CATHETER;  Surgeon: Ihor Gully, MD;  Location: [facility];  Service: Urology;  Laterality: N/A;   SHOULDER ARTHROSCOPY W/ SUBACROMIAL DECOMPRESSION AND DISTAL CLAVICLE EXCISION Left 05/20/2010   w/  Debridement , Bursectomy, Acromioplasty,  Capsulectomy   TONSILLECTOMY     TRANSURETHRAL RESECTION OF PROSTATE N/A 09/30/2015   Procedure: TURP (TRANSURETHRAL RESECTION OF PROSTATE WITH GYRUS;  Surgeon: Ihor Gully,  MD;  Location: [facility];  Service: Urology;  Laterality: N/A;   TRANSURETHRAL RESECTION OF PROSTATE N/A 09/21/2022   Procedure: BIPOLAR TRANSURETHRAL RESECTION OF THE PROSTATE (TURP);  Surgeon: Jannifer Hick, MD;  Location: [facility];  Service: Urology;  Laterality: N/A;  75 MINUTES NEEDED FOR CASE   WIDE EXCISION MALIGNANT MELANOMA LEFT FOREARM/  LEFT AXILLA LYMPH NODE BX  09/10/2004   Patient Active Problem List   Diagnosis Date Noted   BPH (benign prostatic hyperplasia) 09/21/2022   Parkinson's disease (HCC) 04/13/2017   Benign prostatic hyperplasia with urinary retention 09/30/2015    ONSET DATE: 07/13/2023  REFERRING DIAG: G20.B1 (ICD-10-CM) - Parkinson's disease with dyskinesia, without mention of fluctuations   THERAPY DIAG:  Unsteadiness on feet  Other symptoms and signs involving the nervous system  Abnormal posture  Other lack of coordination  Rationale for Evaluation and Treatment: Rehabilitation  SUBJECTIVE:  SUBJECTIVE STATEMENT: No changes. No falls. Nothing new.   Pt accompanied by: self  PERTINENT HISTORY: PD (Diagnosed 2018), HTN, HLD, OA, h/o melanoma, PVD   PAIN:  Are you having pain? No  There were no vitals filed for this visit.   PRECAUTIONS: None  RED FLAGS: None   WEIGHT BEARING RESTRICTIONS: No  FALLS: Has patient fallen in last 6 months? No and Reports some stumbles   LIVING ENVIRONMENT: Lives with: lives with their spouse Lives in: 2 story home, bedroom/full bath on first floor. 3 steps to enter  Has following equipment at home: Grab bars  PLOF: Independent  PATIENT GOALS: Wants to be able to walk and not look like he's drunk   OBJECTIVE:  Note: Objective measures were completed at Evaluation unless otherwise  noted.  COGNITION: Overall cognitive status: Within functional limits for tasks assessed   SENSATION: Light touch: WFL  COORDINATION: Heel to shin: WNL   EDEMA:  Reports ankles are normally a little swollen (PCP aware), wears compression socks    POSTURE: rounded shoulders and forward head   LOWER EXTREMITY MMT:    MMT Right Eval Left Eval  Hip flexion 5 5  Hip extension    Hip abduction    Hip adduction    Hip internal rotation    Hip external rotation    Knee flexion 4+ 4+  Knee extension 4+ 4+  Ankle dorsiflexion 5 5  Ankle plantarflexion    Ankle inversion    Ankle eversion    (Blank rows = not tested)  BED MOBILITY:  Pt reports some difficulties with getting in and out of the bed and scooting (pt reports the PWR move helps with the scooting on a firm mattress). Reports when it is softer.  TRANSFERS: Assistive device utilized: None  Sit to stand: SBA Stand to sit: SBA Performs with no UE support  GAIT: Gait pattern: step through pattern, decreased arm swing- Right, decreased arm swing- Left, decreased stride length, and narrow BOS Distance walked: Clinic distances  Assistive device utilized: None Level of assistance: Modified independence Comments: Pt with tendency to put hands in his pockets instead of performing arm swing.    FUNCTIONAL TESTS:  5 times sit to stand: 12 seconds with no UE support  10 meter walk test: 9.75 seconds = 3.36 ft/sec    TODAY'S TREATMENT:                                                                                                                                NMR: On rockerboard in A/P direction: Weight shifting for hip/ankle strategy 20 reps  PWR Up 20 reps, intermittent UE taps to bars Head turns 2 x 10 reps, head nods 2 x 10 reps, pt more challenged with head nods  Alternating SLS taps to 2 cones in forwards direction x12 reps each leg, and then cross body 12 reps each leg, primarily UE support for balance  On blue foam beam: Side stepping with tossing 6# medicine ball to floor and catching it down and back x3 reps, pt at times losing his balance, but able to regain balance using stepping strategy With feet apart > feet together bouncing large purple ball on floor to other therapist and catching it, performed approx 30-35 reps to work on ankle strategy/coordination  With 2 scarves in each hand: Alternating forward step with reciprocal UE movement 15 reps each leg, with focus on large step and large amplitude throw/catch with scarf. Pt having difficulty at times with reciprocal movement and tended to perform with ipsilateral leg and arm. Pt able to state afterwards that he did the incorrect leg and was able to correct  Alternating backwards step and reciprocal scarf toss 12 reps each side, pt did better with reciprocal movements when stepping backwards  Gait around gym 230' x 1 with scarf toss with cues for large arm swing, opening up hand big, added cognitive challenge with pt counting backwards by 5 and then by 3. Pt slowed down with addition of cog dual task   With 6 stepping stones on floor: Working on large amplitude stepping to stones, down and back x4 reps, cues for slowed pace for SLS, intermittent taps to counter for balance    PATIENT EDUCATION: Education details: Continue HEP Person educated: Patient Education method: Explanation Education comprehension: verbalized understanding and returned demonstration  HOME EXERCISE PROGRAM: Multi-directional stepping sheet  Access Code: [email or web address] URL: [email or web address] Date: 08/24/2023 Prepared by: Sherlie Ban  Exercises - Romberg Stance Eyes Closed on Foam Pad  - 1 x daily - 4-5 x weekly - 3 sets - 30 hold - Romberg Stance on Foam Pad with Head Rotation  - 1 x daily - 4-5 x weekly - 2 sets - 10 reps - Walking March  - 1 x daily - 4-5 x weekly - 3 sets   GOALS: Goals reviewed with patient? Yes  SHORT TERM  GOALS: ALL STGS = LTGS   LONG TERM GOALS: Target date: 09/10/2023  Pt will be independent with HEP for Parkinson's -specific exercises to improve functional mobility, transfers, and balance.  Baseline:  Goal status: INITIAL  2.  Pt will perform at least 8 of 10 reps of sit<>stand transfers from surfaces <18" without UE support, independently, for improved functional strength with transfers from lower surfaces Baseline:  Goal status: INITIAL  3.  Pt will improve miniBEST to at least a 24/28 in order to demo decr fall risk. Baseline: 21/28 Goal status: INITIAL  4.  Pt will improve TUG and TUG cog score to less than or equal to 10% difference for improved dual task/decreased fall risk.  Baseline: TUG: 7.9, cog TUG: 12.1 Goal status: INITIAL   ASSESSMENT:  CLINICAL IMPRESSION: Today's skilled session focused on balance strategies on unlevel surfaces for hip/ankle strategy, reciprocal movements, SLS tasks. With use of 2 scarves, pt challenged by reciprocal scarf toss with forwards step. Pt tended to perform with ipsilateral leg/arm, but was able to state that he performed with the incorrect arm when tossing scarf. Will continue per POC.    OBJECTIVE IMPAIRMENTS: Abnormal gait, decreased activity tolerance, decreased balance, decreased coordination, decreased strength, and postural dysfunction.   ACTIVITY LIMITATIONS: locomotion level  PARTICIPATION LIMITATIONS: community activity  PERSONAL FACTORS: Age, Behavior pattern, Past/current experiences, Time since onset of injury/illness/exacerbation, and 3+ comorbidities: PD (Diagnosed 2018), HTN, HLD, OA, h/o melanoma, PVD   are also affecting patient's functional outcome.   REHAB POTENTIAL: Good  CLINICAL DECISION MAKING: Stable/uncomplicated  EVALUATION COMPLEXITY: Low  PLAN:  PT FREQUENCY: 2x/week  PT DURATION: 8 weeks  PLANNED INTERVENTIONS: 97164- PT Re-evaluation, 97110-Therapeutic exercises, 97530- Therapeutic activity,  97112- Neuromuscular re-education, 97535- Self Care, 21308- Manual therapy, [phone]- Gait training, Patient/Family education, Balance training, Stair training, and Vestibular training  PLAN FOR NEXT SESSION: Work on gait with arm swing, work on balance - EC, gait with head turns, SLS, obstacles, stepping in the posterior and lateral directions. Reciprocal movements and arm swing tasks    Try rebounder, retro gait with ball toss, sit <> stands from lower surfaces   Drake Leach, PT, DPT 09/02/2023, 8:53 AM

## 2023-09-07 ENCOUNTER — Ambulatory Visit: Payer: No Typology Code available for payment source | Admitting: Physical Therapy

## 2023-09-07 ENCOUNTER — Encounter: Payer: Self-pay | Admitting: Physical Therapy

## 2023-09-07 DIAGNOSIS — R293 Abnormal posture: Secondary | ICD-10-CM

## 2023-09-07 DIAGNOSIS — R2681 Unsteadiness on feet: Secondary | ICD-10-CM | POA: Diagnosis not present

## 2023-09-07 DIAGNOSIS — R29818 Other symptoms and signs involving the nervous system: Secondary | ICD-10-CM

## 2023-09-07 NOTE — Therapy (Signed)
OUTPATIENT PHYSICAL THERAPY NEURO TREATMENT   Patient Name: Zachary Wang. MRN: [id number] DOB:October 30, 1940, 83 y.o., male Today's Date: 09/07/2023   PCP: Ch[facility] Greathouse  REFERRING PROVIDER: Lenice Pressman, MD (from [place]) - will send POC to PCP   END OF SESSION:  PT End of Session - 09/07/23 0850     Visit Number 6    Number of Visits 9    Date for PT Re-Evaluation 09/28/23   due to delay in scheduling   Authorization Type VA, visit limit 15    PT Start Time 0848    PT Stop Time 0928    PT Time Calculation (min) 40 min    Equipment Utilized During Treatment Gait belt    Activity Tolerance Patient tolerated treatment well    Behavior During Therapy WFL for tasks assessed/performed             Past Medical History:  Diagnosis Date   Arthritis    hands   BPH (benign prostatic hyperplasia)    Cancer (HCC)    skin cancer   Elevated PSA    Foley catheter in place    History of adenomatous polyp of colon    2014  tubular adenoma   History of malignant melanoma    s/p  excision left forearm and left axill lymph node bx 09-10-2004   Hyperlipidemia    Hypertension    Peripheral vascular disease (HCC)    Urinary retention    Past Surgical History:  Procedure Laterality Date   ANAL FISSURE REPAIR  08/17/1982   COLONOSCOPY N/A 05/02/2013   Procedure: COLONOSCOPY;  Surgeon: Charolett Bumpers, MD;  Location: [facility];  Service: Endoscopy;  Laterality: N/A;   INSERTION OF SUPRAPUBIC CATHETER N/A 09/30/2015   Procedure: INSERTION OF SUPRAPUBIC CATHETER;  Surgeon: Ihor Gully, MD;  Location: [facility];  Service: Urology;  Laterality: N/A;   SHOULDER ARTHROSCOPY W/ SUBACROMIAL DECOMPRESSION AND DISTAL CLAVICLE EXCISION Left 05/20/2010   w/  Debridement , Bursectomy, Acromioplasty,  Capsulectomy   TONSILLECTOMY     TRANSURETHRAL RESECTION OF PROSTATE N/A 09/30/2015   Procedure: TURP (TRANSURETHRAL RESECTION OF PROSTATE WITH GYRUS;  Surgeon: Ihor Gully,  MD;  Location: [facility];  Service: Urology;  Laterality: N/A;   TRANSURETHRAL RESECTION OF PROSTATE N/A 09/21/2022   Procedure: BIPOLAR TRANSURETHRAL RESECTION OF THE PROSTATE (TURP);  Surgeon: Jannifer Hick, MD;  Location: [facility];  Service: Urology;  Laterality: N/A;  75 MINUTES NEEDED FOR CASE   WIDE EXCISION MALIGNANT MELANOMA LEFT FOREARM/  LEFT AXILLA LYMPH NODE BX  09/10/2004   Patient Active Problem List   Diagnosis Date Noted   BPH (benign prostatic hyperplasia) 09/21/2022   Parkinson's disease (HCC) 04/13/2017   Benign prostatic hyperplasia with urinary retention 09/30/2015    ONSET DATE: 07/13/2023  REFERRING DIAG: G20.B1 (ICD-10-CM) - Parkinson's disease with dyskinesia, without mention of fluctuations   THERAPY DIAG:  Unsteadiness on feet  Other symptoms and signs involving the nervous system  Abnormal posture  Rationale for Evaluation and Treatment: Rehabilitation  SUBJECTIVE:  SUBJECTIVE STATEMENT: No changes, no falls. Still having a tough time getting up from the couch.   Pt accompanied by: self  PERTINENT HISTORY: PD (Diagnosed 2018), HTN, HLD, OA, h/o melanoma, PVD   PAIN:  Are you having pain? No  There were no vitals filed for this visit.   PRECAUTIONS: None  RED FLAGS: None   WEIGHT BEARING RESTRICTIONS: No  FALLS: Has patient fallen in last 6 months? No and Reports some stumbles   LIVING ENVIRONMENT: Lives with: lives with their spouse Lives in: 2 story home, bedroom/full bath on first floor. 3 steps to enter  Has following equipment at home: Grab bars  PLOF: Independent  PATIENT GOALS: Wants to be able to walk and not look like he's drunk   OBJECTIVE:  Note: Objective measures were completed at Evaluation unless otherwise  noted.  COGNITION: Overall cognitive status: Within functional limits for tasks assessed   SENSATION: Light touch: WFL  COORDINATION: Heel to shin: WNL   EDEMA:  Reports ankles are normally a little swollen (PCP aware), wears compression socks    POSTURE: rounded shoulders and forward head   LOWER EXTREMITY MMT:    MMT Right Eval Left Eval  Hip flexion 5 5  Hip extension    Hip abduction    Hip adduction    Hip internal rotation    Hip external rotation    Knee flexion 4+ 4+  Knee extension 4+ 4+  Ankle dorsiflexion 5 5  Ankle plantarflexion    Ankle inversion    Ankle eversion    (Blank rows = not tested)  BED MOBILITY:  Pt reports some difficulties with getting in and out of the bed and scooting (pt reports the PWR move helps with the scooting on a firm mattress). Reports when it is softer.  TRANSFERS: Assistive device utilized: None  Sit to stand: SBA Stand to sit: SBA Performs with no UE support  GAIT: Gait pattern: step through pattern, decreased arm swing- Right, decreased arm swing- Left, decreased stride length, and narrow BOS Distance walked: Clinic distances  Assistive device utilized: None Level of assistance: Modified independence Comments: Pt with tendency to put hands in his pockets instead of performing arm swing.    FUNCTIONAL TESTS:  5 times sit to stand: 12 seconds with no UE support  10 meter walk test: 9.75 seconds = 3.36 ft/sec    TODAY'S TREATMENT:                                                                                                                               Therapeutic Exercise:  SciFit with BUE/BLE Multi-Peaks Gear 4.5 for 8 minutes for neural priming, reciprocal movements, aerobic warm-up. Pt reporting RPE as 7/10. Pt able to keep spm in the 90s throughout.   NMR: Working on standing from lower surfaces to simulate getting up from pt's couch:  From 12-14" box with air ex on top and then green balance  disc to  simulate lowering surfaces, pt standing with wider BOS and able to get good weight shift forwards. Performed multiple reps, cues to also use momentum and counting to 3 and help with forward lean to stand, pt performed a couple times with no issues. Also discussed for pt to take a video of how he is currently performing at home to help visualize  Resisted gait in forwards direction from 2nd PT with 115' steady resistance with cues for posture, stride length, and arm swing, 230' with varying resistance with pt having to keep balance with stepping strategies, CGA for balance  Retro gait with ball toss 20', an additional 2 x 20' with cognitive dual tasking with pt naming states/cities, pt does need cues at times for incr step length, but otherwise pt with good balance backwards On thicker blue foam: At bottom of stair case 10 reps step ups with contralateral march for dynamic SLS, cues for posture and looking forward, pt doing better with balance with incr reps EC with feet hip width > together 4 x 30 seconds, pt with incr postural sway with feet together    PATIENT EDUCATION: Education details: Continue HEP Person educated: Patient Education method: Explanation Education comprehension: verbalized understanding and returned demonstration  HOME EXERCISE PROGRAM: Multi-directional stepping sheet  Access Code: [email or web address] URL: [email or web address] Date: 08/24/2023 Prepared by: Sherlie Ban  Exercises - Romberg Stance Eyes Closed on Foam Pad  - 1 x daily - 4-5 x weekly - 3 sets - 30 hold - Romberg Stance on Foam Pad with Head Rotation  - 1 x daily - 4-5 x weekly - 2 sets - 10 reps - Walking March  - 1 x daily - 4-5 x weekly - 3 sets   GOALS: Goals reviewed with patient? Yes  SHORT TERM GOALS: ALL STGS = LTGS   LONG TERM GOALS: Target date: 09/10/2023  Pt will be independent with HEP for Parkinson's -specific exercises to improve functional mobility, transfers, and balance.   Baseline:  Goal status: INITIAL  2.  Pt will perform at least 8 of 10 reps of sit<>stand transfers from surfaces <18" without UE support, independently, for improved functional strength with transfers from lower surfaces Baseline:  Goal status: INITIAL  3.  Pt will improve miniBEST to at least a 24/28 in order to demo decr fall risk. Baseline: 21/28 Goal status: INITIAL  4.  Pt will improve TUG and TUG cog score to less than or equal to 10% difference for improved dual task/decreased fall risk.  Baseline: TUG: 7.9, cog TUG: 12.1 Goal status: INITIAL   ASSESSMENT:  CLINICAL IMPRESSION: Today's skilled session focused on balance strategies for reactive stepping with resisted gait, SLS, and balance with EC. With resisted gait, pt did well with using appropriate stepping strategies to keep his balance. Also worked on getting up from lower surfaces with pt able to perform from 12-14" box with green balance disc on top with no issues. Pt has more of a challenge at home getting up from the couch, discussed trying to use momentum to help with this at home. Will continue per POC.    OBJECTIVE IMPAIRMENTS: Abnormal gait, decreased activity tolerance, decreased balance, decreased coordination, decreased strength, and postural dysfunction.   ACTIVITY LIMITATIONS: locomotion level  PARTICIPATION LIMITATIONS: community activity  PERSONAL FACTORS: Age, Behavior pattern, Past/current experiences, Time since onset of injury/illness/exacerbation, and 3+ comorbidities: PD (Diagnosed 2018), HTN, HLD, OA, h/o melanoma, PVD   are also affecting patient's functional outcome.   REHAB POTENTIAL:  Good  CLINICAL DECISION MAKING: Stable/uncomplicated  EVALUATION COMPLEXITY: Low  PLAN:  PT FREQUENCY: 2x/week  PT DURATION: 8 weeks  PLANNED INTERVENTIONS: 97164- PT Re-evaluation, 97110-Therapeutic exercises, 97530- Therapeutic activity, 97112- Neuromuscular re-education, 97535- Self Care, 19147- Manual  therapy, [phone]- Gait training, Patient/Family education, Balance training, Stair training, and Vestibular training  PLAN FOR NEXT SESSION: Work on gait with arm swing, work on balance - EC, gait with head turns, SLS, obstacles, stepping in the posterior and lateral directions. Reciprocal movements and arm swing tasks    Try rebounder, retro gait with ball toss, sit <> stands from lower surfaces   Drake Leach, PT, DPT 09/07/2023, 10:46 AM

## 2023-09-09 ENCOUNTER — Encounter: Payer: Self-pay | Admitting: Physical Therapy

## 2023-09-09 ENCOUNTER — Ambulatory Visit: Payer: No Typology Code available for payment source | Admitting: Physical Therapy

## 2023-09-09 DIAGNOSIS — R2681 Unsteadiness on feet: Secondary | ICD-10-CM | POA: Diagnosis not present

## 2023-09-09 DIAGNOSIS — R29818 Other symptoms and signs involving the nervous system: Secondary | ICD-10-CM

## 2023-09-09 DIAGNOSIS — R293 Abnormal posture: Secondary | ICD-10-CM

## 2023-09-09 DIAGNOSIS — R278 Other lack of coordination: Secondary | ICD-10-CM

## 2023-09-09 NOTE — Therapy (Signed)
OUTPATIENT PHYSICAL THERAPY NEURO TREATMENT   Patient Name: Zachary Wang. MRN: [id number] DOB:1941/06/23, 83 y.o., male Today's Date: 09/09/2023   PCP: [facility] Greathouse  REFERRING PROVIDER: Lenice Pressman, MD (from [place]) - will send POC to PCP   END OF SESSION:  PT End of Session - 09/09/23 0804     Visit Number 7    Number of Visits 9    Date for PT Re-Evaluation 09/28/23   due to delay in scheduling   Authorization Type VA, visit limit 15    PT Start Time 0802    PT Stop Time 0843    PT Time Calculation (min) 41 min    Equipment Utilized During Treatment Gait belt    Activity Tolerance Patient tolerated treatment well    Behavior During Therapy WFL for tasks assessed/performed             Past Medical History:  Diagnosis Date   Arthritis    hands   BPH (benign prostatic hyperplasia)    Cancer (HCC)    skin cancer   Elevated PSA    Foley catheter in place    History of adenomatous polyp of colon    2014  tubular adenoma   History of malignant melanoma    s/p  excision left forearm and left axill lymph node bx 09-10-2004   Hyperlipidemia    Hypertension    Peripheral vascular disease (HCC)    Urinary retention    Past Surgical History:  Procedure Laterality Date   ANAL FISSURE REPAIR  08/17/1982   COLONOSCOPY N/A 05/02/2013   Procedure: COLONOSCOPY;  Surgeon: Charolett Bumpers, MD;  Location: [facility];  Service: Endoscopy;  Laterality: N/A;   INSERTION OF SUPRAPUBIC CATHETER N/A 09/30/2015   Procedure: INSERTION OF SUPRAPUBIC CATHETER;  Surgeon: Ihor Gully, MD;  Location: [facility];  Service: Urology;  Laterality: N/A;   SHOULDER ARTHROSCOPY W/ SUBACROMIAL DECOMPRESSION AND DISTAL CLAVICLE EXCISION Left 05/20/2010   w/  Debridement , Bursectomy, Acromioplasty,  Capsulectomy   TONSILLECTOMY     TRANSURETHRAL RESECTION OF PROSTATE N/A 09/30/2015   Procedure: TURP (TRANSURETHRAL RESECTION OF PROSTATE WITH GYRUS;  Surgeon: Ihor Gully,  MD;  Location: [facility];  Service: Urology;  Laterality: N/A;   TRANSURETHRAL RESECTION OF PROSTATE N/A 09/21/2022   Procedure: BIPOLAR TRANSURETHRAL RESECTION OF THE PROSTATE (TURP);  Surgeon: Jannifer Hick, MD;  Location: [facility];  Service: Urology;  Laterality: N/A;  75 MINUTES NEEDED FOR CASE   WIDE EXCISION MALIGNANT MELANOMA LEFT FOREARM/  LEFT AXILLA LYMPH NODE BX  09/10/2004   Patient Active Problem List   Diagnosis Date Noted   BPH (benign prostatic hyperplasia) 09/21/2022   Parkinson's disease (HCC) 04/13/2017   Benign prostatic hyperplasia with urinary retention 09/30/2015    ONSET DATE: 07/13/2023  REFERRING DIAG: G20.B1 (ICD-10-CM) - Parkinson's disease with dyskinesia, without mention of fluctuations   THERAPY DIAG:  Unsteadiness on feet  Other symptoms and signs involving the nervous system  Abnormal posture  Other lack of coordination  Rationale for Evaluation and Treatment: Rehabilitation  SUBJECTIVE:  SUBJECTIVE STATEMENT: Nothing new since he was last here.   Pt accompanied by: self  PERTINENT HISTORY: PD (Diagnosed 2018), HTN, HLD, OA, h/o melanoma, PVD   PAIN:  Are you having pain? No  There were no vitals filed for this visit.   PRECAUTIONS: None  RED FLAGS: None   WEIGHT BEARING RESTRICTIONS: No  FALLS: Has patient fallen in last 6 months? No and Reports some stumbles   LIVING ENVIRONMENT: Lives with: lives with their spouse Lives in: 2 story home, bedroom/full bath on first floor. 3 steps to enter  Has following equipment at home: Grab bars  PLOF: Independent  PATIENT GOALS: Wants to be able to walk and not look like he's drunk   OBJECTIVE:  Note: Objective measures were completed at Evaluation unless otherwise  noted.  COGNITION: Overall cognitive status: Within functional limits for tasks assessed   SENSATION: Light touch: WFL  COORDINATION: Heel to shin: WNL   EDEMA:  Reports ankles are normally a little swollen (PCP aware), wears compression socks    POSTURE: rounded shoulders and forward head   LOWER EXTREMITY MMT:    MMT Right Eval Left Eval  Hip flexion 5 5  Hip extension    Hip abduction    Hip adduction    Hip internal rotation    Hip external rotation    Knee flexion 4+ 4+  Knee extension 4+ 4+  Ankle dorsiflexion 5 5  Ankle plantarflexion    Ankle inversion    Ankle eversion    (Blank rows = not tested)  BED MOBILITY:  Pt reports some difficulties with getting in and out of the bed and scooting (pt reports the PWR move helps with the scooting on a firm mattress). Reports when it is softer.  TRANSFERS: Assistive device utilized: None  Sit to stand: SBA Stand to sit: SBA Performs with no UE support  GAIT: Gait pattern: step through pattern, decreased arm swing- Right, decreased arm swing- Left, decreased stride length, and narrow BOS Distance walked: Clinic distances  Assistive device utilized: None Level of assistance: Modified independence Comments: Pt with tendency to put hands in his pockets instead of performing arm swing.    FUNCTIONAL TESTS:  5 times sit to stand: 12 seconds with no UE support  10 meter walk test: 9.75 seconds = 3.36 ft/sec    TODAY'S TREATMENT:                                                                                                                               NMR: On black side of BOSU: A/P weight shifting 15 reps  Lateral weight shifting 15 reps each side, intermittent UE support for balance  20 reps PWR Up, cues for elbow extension and scap retraction, pt with improved balance with this with incr reps  Using Squigz on mirror, 2 sets of 8 reps each side, performed trunk rotation and reaching across body to grab  [place] big  and then with same hand to ipsilateral side, performed superior/lateral reaching to place on mirror, pt needing intermittent UE support for balance  On blue foam beam: Side step to R and then tossing 2kg ball into rebounder and catching it, then repeated to L side for improved stepping strategy, performed 10 reps each side, progressed difficulty by adding in blue resisted band around pt's pelvis with PT performing random perturbations with pt trying to keep his balance, pt needing to utilize stepping strategy at times to keep his balance, performed an additional 10 reps each side  Marching with trunk rotation and holding 2# bar, performed 5 reps of 20-30' each, pt with decr march height for balance, cues to also turn head in the direction that pt was twisting with CGA at times for balance, pt also able to use stepping strategy cross over lateral step to maintain balnace  On blue mat: Alternating forward stepping with head turn to R/L 10 reps each side, then performed alternating stepping in backwards direction with head turn to R/L 10 reps each side  Forward/retro walking with EC, down and back x4 reps, pt initially more unsteady with smaller steps but improved with incr reps   PATIENT EDUCATION: Education details: Continue HEP, plan to finalize HEP and D/C next week  Person educated: Patient Education method: Explanation Education comprehension: verbalized understanding and returned demonstration  HOME EXERCISE PROGRAM: Multi-directional stepping sheet  Access Code: [email or web address] URL: [email or web address] Date: 08/24/2023 Prepared by: Sherlie Ban  Exercises - Romberg Stance Eyes Closed on Foam Pad  - 1 x daily - 4-5 x weekly - 3 sets - 30 hold - Romberg Stance on Foam Pad with Head Rotation  - 1 x daily - 4-5 x weekly - 2 sets - 10 reps - Walking March  - 1 x daily - 4-5 x weekly - 3 sets   GOALS: Goals reviewed with patient? Yes  SHORT TERM GOALS: ALL STGS =  LTGS   LONG TERM GOALS: Target date: 09/10/2023  Pt will be independent with HEP for Parkinson's -specific exercises to improve functional mobility, transfers, and balance.  Baseline:  Goal status: INITIAL  2.  Pt will perform at least 8 of 10 reps of sit<>stand transfers from surfaces <18" without UE support, independently, for improved functional strength with transfers from lower surfaces Baseline:  Goal status: INITIAL  3.  Pt will improve miniBEST to at least a 24/28 in order to demo decr fall risk. Baseline: 21/28 Goal status: INITIAL  4.  Pt will improve TUG and TUG cog score to less than or equal to 10% difference for improved dual task/decreased fall risk.  Baseline: TUG: 7.9, cog TUG: 12.1 Goal status: INITIAL   ASSESSMENT:  CLINICAL IMPRESSION: Today's skilled session focused on balance strategies for trunk rotation, weight shifting, SLS, stepping strategies, and on compliant surfaces. With balance tasks with pt getting off balance, able to utilize appropriate stepping strategy as a balance reaction (with SLS tasks and when standing on foam beam). Will continue per POC.    OBJECTIVE IMPAIRMENTS: Abnormal gait, decreased activity tolerance, decreased balance, decreased coordination, decreased strength, and postural dysfunction.   ACTIVITY LIMITATIONS: locomotion level  PARTICIPATION LIMITATIONS: community activity  PERSONAL FACTORS: Age, Behavior pattern, Past/current experiences, Time since onset of injury/illness/exacerbation, and 3+ comorbidities: PD (Diagnosed 2018), HTN, HLD, OA, h/o melanoma, PVD   are also affecting patient's functional outcome.   REHAB POTENTIAL: Good  CLINICAL DECISION MAKING: Stable/uncomplicated  EVALUATION COMPLEXITY: Low  PLAN:  PT FREQUENCY:  2x/week  PT DURATION: 8 weeks  PLANNED INTERVENTIONS: 97164- PT Re-evaluation, 97110-Therapeutic exercises, 97530- Therapeutic activity, 97112- Neuromuscular re-education, 97535- Self Care,  97140- Manual therapy, [phone]- Gait training, Patient/Family education, Balance training, Stair training, and Vestibular training  PLAN FOR NEXT SESSION: balance on BOSU, posture exercises, forwards/retro step on mat, finalize HEP and check goals and anticipate D/C     Drake Leach, PT, DPT 09/09/2023, 8:56 AM

## 2023-09-14 ENCOUNTER — Encounter: Payer: Self-pay | Admitting: Physical Therapy

## 2023-09-14 ENCOUNTER — Ambulatory Visit: Payer: No Typology Code available for payment source | Admitting: Physical Therapy

## 2023-09-14 DIAGNOSIS — R2681 Unsteadiness on feet: Secondary | ICD-10-CM

## 2023-09-14 DIAGNOSIS — R29818 Other symptoms and signs involving the nervous system: Secondary | ICD-10-CM

## 2023-09-14 DIAGNOSIS — R293 Abnormal posture: Secondary | ICD-10-CM

## 2023-09-14 DIAGNOSIS — R278 Other lack of coordination: Secondary | ICD-10-CM

## 2023-09-14 NOTE — Therapy (Signed)
OUTPATIENT PHYSICAL THERAPY NEURO TREATMENT   Patient Name: Zachary Wang. MRN: [id number] DOB:03-21-1941, 83 y.o., male Today's Date: 09/14/2023   PCP: [facility]  REFERRING PROVIDER: Lenice Pressman, MD (from [place]) - will send POC to PCP   END OF SESSION:  PT End of Session - 09/14/23 0930     Visit Number 8    Number of Visits 9    Date for PT Re-Evaluation 09/28/23   due to delay in scheduling   Authorization Type VA, visit limit 15    PT Start Time 0930    PT Stop Time 1012    PT Time Calculation (min) 42 min    Equipment Utilized During Treatment Gait belt    Activity Tolerance Patient tolerated treatment well    Behavior During Therapy WFL for tasks assessed/performed             Past Medical History:  Diagnosis Date   Arthritis    hands   BPH (benign prostatic hyperplasia)    Cancer (HCC)    skin cancer   Elevated PSA    Foley catheter in place    History of adenomatous polyp of colon    2014  tubular adenoma   History of malignant melanoma    s/p  excision left forearm and left axill lymph node bx 09-10-2004   Hyperlipidemia    Hypertension    Peripheral vascular disease (HCC)    Urinary retention    Past Surgical History:  Procedure Laterality Date   ANAL FISSURE REPAIR  08/17/1982   COLONOSCOPY N/A 05/02/2013   Procedure: COLONOSCOPY;  Surgeon: Charolett Bumpers, MD;  Location: [facility];  Service: Endoscopy;  Laterality: N/A;   INSERTION OF SUPRAPUBIC CATHETER N/A 09/30/2015   Procedure: INSERTION OF SUPRAPUBIC CATHETER;  Surgeon: Ihor Gully, MD;  Location: [facility];  Service: Urology;  Laterality: N/A;   SHOULDER ARTHROSCOPY W/ SUBACROMIAL DECOMPRESSION AND DISTAL CLAVICLE EXCISION Left 05/20/2010   w/  Debridement , Bursectomy, Acromioplasty,  Capsulectomy   TONSILLECTOMY     TRANSURETHRAL RESECTION OF PROSTATE N/A 09/30/2015   Procedure: TURP (TRANSURETHRAL RESECTION OF PROSTATE WITH GYRUS;  Surgeon: Ihor Gully,  MD;  Location: [facility];  Service: Urology;  Laterality: N/A;   TRANSURETHRAL RESECTION OF PROSTATE N/A 09/21/2022   Procedure: BIPOLAR TRANSURETHRAL RESECTION OF THE PROSTATE (TURP);  Surgeon: Jannifer Hick, MD;  Location: [facility];  Service: Urology;  Laterality: N/A;  75 MINUTES NEEDED FOR CASE   WIDE EXCISION MALIGNANT MELANOMA LEFT FOREARM/  LEFT AXILLA LYMPH NODE BX  09/10/2004   Patient Active Problem List   Diagnosis Date Noted   BPH (benign prostatic hyperplasia) 09/21/2022   Parkinson's disease (HCC) 04/13/2017   Benign prostatic hyperplasia with urinary retention 09/30/2015    ONSET DATE: 07/13/2023  REFERRING DIAG: G20.B1 (ICD-10-CM) - Parkinson's disease with dyskinesia, without mention of fluctuations   THERAPY DIAG:  Unsteadiness on feet  Other symptoms and signs involving the nervous system  Abnormal posture  Other lack of coordination  Rationale for Evaluation and Treatment: Rehabilitation  SUBJECTIVE:  SUBJECTIVE STATEMENT: Nothing new since he was last here.   Pt accompanied by: self  PERTINENT HISTORY: PD (Diagnosed 2018), HTN, HLD, OA, h/o melanoma, PVD   PAIN:  Are you having pain? No  There were no vitals filed for this visit.   PRECAUTIONS: None  RED FLAGS: None   WEIGHT BEARING RESTRICTIONS: No  FALLS: Has patient fallen in last 6 months? No and Reports some stumbles   LIVING ENVIRONMENT: Lives with: lives with their spouse Lives in: 2 story home, bedroom/full bath on first floor. 3 steps to enter  Has following equipment at home: Grab bars  PLOF: Independent  PATIENT GOALS: Wants to be able to walk and not look like he's drunk   OBJECTIVE:  Note: Objective measures were completed at Evaluation unless otherwise  noted.  COGNITION: Overall cognitive status: Within functional limits for tasks assessed   SENSATION: Light touch: WFL  COORDINATION: Heel to shin: WNL   EDEMA:  Reports ankles are normally a little swollen (PCP aware), wears compression socks    POSTURE: rounded shoulders and forward head   LOWER EXTREMITY MMT:    MMT Right Eval Left Eval  Hip flexion 5 5  Hip extension    Hip abduction    Hip adduction    Hip internal rotation    Hip external rotation    Knee flexion 4+ 4+  Knee extension 4+ 4+  Ankle dorsiflexion 5 5  Ankle plantarflexion    Ankle inversion    Ankle eversion    (Blank rows = not tested)  BED MOBILITY:  Pt reports some difficulties with getting in and out of the bed and scooting (pt reports the PWR move helps with the scooting on a firm mattress). Reports when it is softer.  TRANSFERS: Assistive device utilized: None  Sit to stand: SBA Stand to sit: SBA Performs with no UE support  GAIT: Gait pattern: step through pattern, decreased arm swing- Right, decreased arm swing- Left, decreased stride length, and narrow BOS Distance walked: Clinic distances  Assistive device utilized: None Level of assistance: Modified independence Comments: Pt with tendency to put hands in his pockets instead of performing arm swing.    FUNCTIONAL TESTS:  5 times sit to stand: 12 seconds with no UE support  10 meter walk test: 9.75 seconds = 3.36 ft/sec    TODAY'S TREATMENT:                                                                                                                               NMR: On treadmill with BUE support working on large amplitude stepping, foot clearance, heel strike:  Speed of 1.7 mph with 30 seconds regular walking and 30 second intervals of treadmill kicks for larger amplitude stepping for 5:30 total. Initial cues to stay closer to physioball when kicking, pt improving when step length and foot clearance with incr reps.  Pt  reporting RPE as 7/10 Posture exercises against  wall Standing tall against wall for 30 seconds  10 reps scapular retraction 10 reps cervical retraction with verbal/demo cues for technique Discussed adding these to HEP to address posture throughout the day with pt verbalizing understanding and planning to add them to HEP as pt felt good after doing them  On black side of BOSU: With fingertip support: 2 x 10 reps head turns, 2 x 10 reps head nods Purple ball tosses with 2nd PT 3 x 10 reps, pt improving with balance with incr reps  On thicker blue foam: EO marching 2 x 10 reps each side EC with feet apart > more narrow BOS 3 x 30 seconds, initially more postural sway, but improved with incr reps  On blue mat: Forward step right into retro step with arm movements with PWR Up and arms out forward, performed 12 reps each side, cued for larger posterior step, intermittent UE support for balance  Alternating posterior stepping with arms forward with use of green resistance band, CGA at times for balance, performed approx. 10 reps with each leg   PATIENT EDUCATION: Education details: Continue HEP, plan to finalize HEP and D/C at next session  Person educated: Patient Education method: Explanation Education comprehension: verbalized understanding and returned demonstration  HOME EXERCISE PROGRAM: Multi-directional stepping sheet  Standing tall against the wall: scap retraction and cervical retraction   Access Code: [email or web address] URL: [email or web address] Date: 08/24/2023 Prepared by: Sherlie Ban  Exercises - Romberg Stance Eyes Closed on Foam Pad  - 1 x daily - 4-5 x weekly - 3 sets - 30 hold - Romberg Stance on Foam Pad with Head Rotation  - 1 x daily - 4-5 x weekly - 2 sets - 10 reps - Walking March  - 1 x daily - 4-5 x weekly - 3 sets   GOALS: Goals reviewed with patient? Yes  SHORT TERM GOALS: ALL STGS = LTGS   LONG TERM GOALS: Target date: 09/10/2023  Pt will be  independent with HEP for Parkinson's -specific exercises to improve functional mobility, transfers, and balance.  Baseline:  Goal status: INITIAL  2.  Pt will perform at least 8 of 10 reps of sit<>stand transfers from surfaces <18" without UE support, independently, for improved functional strength with transfers from lower surfaces Baseline:  Goal status: INITIAL  3.  Pt will improve miniBEST to at least a 24/28 in order to demo decr fall risk. Baseline: 21/28 Goal status: INITIAL  4.  Pt will improve TUG and TUG cog score to less than or equal to 10% difference for improved dual task/decreased fall risk.  Baseline: TUG: 7.9, cog TUG: 12.1 Goal status: INITIAL   ASSESSMENT:  CLINICAL IMPRESSION: Started session on treadmill today to work on larger amplitude stepping with ball as target for improved foot clearance and stride length. Pt reporting RPE as 7/10 and did demo improved stride length afterwards. Worked on postural exercises against wall with scap retraction and cervical retraction and verbally added to HEP. Pt tolerated higher level balance exercises well and will be ready for D/C at next session. Pt in agreement with plan.  OBJECTIVE IMPAIRMENTS: Abnormal gait, decreased activity tolerance, decreased balance, decreased coordination, decreased strength, and postural dysfunction.   ACTIVITY LIMITATIONS: locomotion level  PARTICIPATION LIMITATIONS: community activity  PERSONAL FACTORS: Age, Behavior pattern, Past/current experiences, Time since onset of injury/illness/exacerbation, and 3+ comorbidities: PD (Diagnosed 2018), HTN, HLD, OA, h/o melanoma, PVD   are also affecting patient's functional outcome.   REHAB POTENTIAL: Good  CLINICAL DECISION  MAKING: Stable/uncomplicated  EVALUATION COMPLEXITY: Low  PLAN:  PT FREQUENCY: 2x/week  PT DURATION: 8 weeks  PLANNED INTERVENTIONS: 97164- PT Re-evaluation, 97110-Therapeutic exercises, 97530- Therapeutic activity, 97112-  Neuromuscular re-education, 97535- Self Care, 16109- Manual therapy, [phone]- Gait training, Patient/Family education, Balance training, Stair training, and Vestibular training  PLAN FOR NEXT SESSION CHECK GOALS, FINALIZE HEP, D/C. Pt already scheduled for screens     Drake Leach, PT, DPT 09/14/2023, 11:49 AM

## 2023-09-15 ENCOUNTER — Ambulatory Visit: Payer: No Typology Code available for payment source | Admitting: Physical Therapy

## 2023-09-22 ENCOUNTER — Encounter: Payer: Self-pay | Admitting: Physical Therapy

## 2023-09-22 ENCOUNTER — Ambulatory Visit: Payer: No Typology Code available for payment source | Attending: Internal Medicine | Admitting: Physical Therapy

## 2023-09-22 DIAGNOSIS — R29818 Other symptoms and signs involving the nervous system: Secondary | ICD-10-CM | POA: Insufficient documentation

## 2023-09-22 DIAGNOSIS — R2681 Unsteadiness on feet: Secondary | ICD-10-CM | POA: Insufficient documentation

## 2023-09-22 DIAGNOSIS — R278 Other lack of coordination: Secondary | ICD-10-CM | POA: Insufficient documentation

## 2023-09-22 DIAGNOSIS — R293 Abnormal posture: Secondary | ICD-10-CM | POA: Diagnosis present

## 2023-09-22 NOTE — Therapy (Signed)
 OUTPATIENT PHYSICAL THERAPY NEURO TREATMENT/DISCHARGE SUMMARY   Patient Name: Zachary Wang. MRN: [id number] DOB:[birth year], 83 y.o., male Today's Date: 09/22/2023   PCP: Janey Santos  REFERRING PROVIDER: Christiana Gun, MD (from [place]) - will send POC to PCP   END OF SESSION:  PT End of Session - 09/22/23 0850     Visit Number 9    Number of Visits 9    Date for PT Re-Evaluation 09/28/23   due to delay in scheduling   Authorization Type [facility], visit limit 15    PT Start Time 0849    PT Stop Time 0918   full time not used due to D/C   PT Time Calculation (min) 29 min    Equipment Utilized During Treatment Gait belt    Activity Tolerance Patient tolerated treatment well    Behavior During Therapy WFL for tasks assessed/performed             Past Medical History:  Diagnosis Date   Arthritis    hands   BPH (benign prostatic hyperplasia)    Cancer (HCC)    skin cancer   Elevated PSA    Foley catheter in place    History of adenomatous polyp of colon    2014  tubular adenoma   History of malignant melanoma    s/p  excision left forearm and left axill lymph node bx 09-10-2004   Hyperlipidemia    Hypertension    Peripheral vascular disease (HCC)    Urinary retention    Past Surgical History:  Procedure Laterality Date   ANAL FISSURE REPAIR  08/17/1982   COLONOSCOPY N/A 05/02/2013   Procedure: COLONOSCOPY;  Surgeon: Gladis MARLA Louder, MD;  Location: [facility];  Service: Endoscopy;  Laterality: N/A;   INSERTION OF SUPRAPUBIC CATHETER N/A 09/30/2015   Procedure: INSERTION OF SUPRAPUBIC CATHETER;  Surgeon: Mark Ottelin, MD;  Location: [facility];  Service: Urology;  Laterality: N/A;   SHOULDER ARTHROSCOPY W/ SUBACROMIAL DECOMPRESSION AND DISTAL CLAVICLE EXCISION Left 05/20/2010   w/  Debridement , Bursectomy, Acromioplasty,  Capsulectomy   TONSILLECTOMY     TRANSURETHRAL RESECTION OF PROSTATE N/A 09/30/2015   Procedure: TURP (TRANSURETHRAL RESECTION  OF PROSTATE WITH GYRUS;  Surgeon: Mark Ottelin, MD;  Location: [facility];  Service: Urology;  Laterality: N/A;   TRANSURETHRAL RESECTION OF PROSTATE N/A 09/21/2022   Procedure: BIPOLAR TRANSURETHRAL RESECTION OF THE PROSTATE (TURP);  Surgeon: Selma Donnice SAUNDERS, MD;  Location: [facility];  Service: Urology;  Laterality: N/A;  75 MINUTES NEEDED FOR CASE   WIDE EXCISION MALIGNANT MELANOMA LEFT FOREARM/  LEFT AXILLA LYMPH NODE BX  09/10/2004   Patient Active Problem List   Diagnosis Date Noted   BPH (benign prostatic hyperplasia) 09/21/2022   Parkinson's disease (HCC) 04/13/2017   Benign prostatic hyperplasia with urinary retention 09/30/2015    ONSET DATE: 07/13/2023  REFERRING DIAG: G20.B1 (ICD-10-CM) - Parkinson's disease with dyskinesia, without mention of fluctuations   THERAPY DIAG:  Unsteadiness on feet  Other symptoms and signs involving the nervous system  Abnormal posture  Other lack of coordination  Rationale for Evaluation and Treatment: Rehabilitation  SUBJECTIVE:  SUBJECTIVE STATEMENT: Pt reports nothing new. Pt reports getting up from the couch is about the same.   Pt accompanied by: self  PERTINENT HISTORY: PD (Diagnosed 2018), HTN, HLD, OA, h/o melanoma, PVD   PAIN:  Are you having pain? No  There were no vitals filed for this visit.   PRECAUTIONS: None  RED FLAGS: None   WEIGHT BEARING RESTRICTIONS: No  FALLS: Has patient fallen in last 6 months? No and Reports some stumbles   LIVING ENVIRONMENT: Lives with: lives with their spouse Lives in: 2 story home, bedroom/full bath on first floor. 3 steps to enter  Has following equipment at home: Grab bars  PLOF: Independent  PATIENT GOALS: Wants to be able to walk and not look like he's drunk   OBJECTIVE:   Note: Objective measures were completed at Evaluation unless otherwise noted.  COGNITION: Overall cognitive status: Within functional limits for tasks assessed   SENSATION: Light touch: WFL  COORDINATION: Heel to shin: WNL   EDEMA:  Reports ankles are normally a little swollen (PCP aware), wears compression socks    POSTURE: rounded shoulders and forward head   LOWER EXTREMITY MMT:    MMT Right Eval Left Eval  Hip flexion 5 5  Hip extension    Hip abduction    Hip adduction    Hip internal rotation    Hip external rotation    Knee flexion 4+ 4+  Knee extension 4+ 4+  Ankle dorsiflexion 5 5  Ankle plantarflexion    Ankle inversion    Ankle eversion    (Blank rows = not tested)  BED MOBILITY:  Pt reports some difficulties with getting in and out of the bed and scooting (pt reports the PWR move helps with the scooting on a firm mattress). Reports when it is softer.  TRANSFERS: Assistive device utilized: None  Sit to stand: SBA Stand to sit: SBA Performs with no UE support  GAIT: Gait pattern: step through pattern, decreased arm swing- Right, decreased arm swing- Left, decreased stride length, and narrow BOS Distance walked: Clinic distances  Assistive device utilized: None Level of assistance: Modified independence Comments: Pt with tendency to put hands in his pockets instead of performing arm swing.    FUNCTIONAL TESTS:  5 times sit to stand: 12 seconds with no UE support  10 meter walk test: 9.75 seconds = 3.36 ft/sec    TODAY'S TREATMENT:                                                                                                                               Therapeutic Activity: From 14.5 box: 10 reps sit <> stands with no UE support    [facility] PT Assessment - 09/22/23 0855       Mini-BESTest   Sit To Stand Normal: Comes to stand without use of hands and stabilizes independently.    Rise to Toes Moderate: Heels up, but not full range (smaller  than when holding hands), OR noticeable instability for 3 s.   unable to hold for 3 seconds   Stand on one leg (left) Moderate: < 20 s   3.1, 6.2   Stand on one leg (right) Moderate: < 20 s   3.4, 8.2   Stand on one leg - lowest score 1    Compensatory Stepping Correction - Forward Normal: Recovers independently with a single, large step (second realignement is allowed).    Compensatory Stepping Correction - Backward Normal: Recovers independently with a single, large step    Compensatory Stepping Correction - Left Lateral Normal: Recovers independently with 1 step (crossover or lateral OK)    Compensatory Stepping Correction - Right Lateral Normal: Recovers independently with 1 step (crossover or lateral OK)    Stepping Corredtion Lateral - lowest score 2    Stance - Feet together, eyes open, firm surface  Normal: 30s    Stance - Feet together, eyes closed, foam surface  Normal: 30s    Incline - Eyes Closed Normal: Stands independently 30s and aligns with gravity    Change in Gait Speed Normal: Significantly changes walkling speed without imbalance    Walk with head turns - Horizontal Normal: performs head turns with no change in gait speed and good balance    Walk with pivot turns Normal: Turns with feet close FAST (< 3 steps) with good balance.    Step over obstacles Normal: Able to step over box with minimal change of gait speed and with good balance.    Timed UP & GO with Dual Task Normal: No noticeable change in sitting, standing or walking while backward counting when compared to TUG without    Mini-BEST total score 26      Timed Up and Go Test   Normal TUG (seconds) 9.6    Cognitive TUG (seconds) 10.5             Reviewed final HEP (see HEP below), pt has exercises organized in his 3 ring binder. Pt with no further questions regarding his HEP at this time    PATIENT EDUCATION: Education details: Continue HEP, results of goals, D/C from PT with plan for PD screens in 6 months   Person educated: Patient Education method: Explanation Education comprehension: verbalized understanding and returned demonstration  HOME EXERCISE PROGRAM: Multi-directional stepping sheet  Access Code: [email or web address] URL: [email or web address] Date: 09/22/2023 Prepared by: Sheffield Senate  Exercises - Romberg Stance Eyes Closed on Foam Pad  - 1 x daily - 4-5 x weekly - 3 sets - 30 hold - Romberg Stance on Foam Pad with Head Rotation  - 1 x daily - 4-5 x weekly - 2 sets - 10 reps - Walking March  - 1 x daily - 4-5 x weekly - 3 sets - Standing Anatomical Position with Scapular Retraction and Depression at Wall  - 1 x daily - 5 x weekly - 3 sets - 30 hold - Cervical Retraction at Wall  - 1 x daily - 5 x weekly - 2 sets - 10 reps  PHYSICAL THERAPY DISCHARGE SUMMARY  Visits from Start of Care: 9  Current functional level related to goals / functional outcomes: See LTGs/Clinical Assessment Statement    Remaining deficits: Postural abnormalities, impaired higher level balance   Education / Equipment: HEP   Patient agrees to discharge. Patient goals were met. Patient is being discharged due to meeting the stated rehab goals. Plan for PD screens in 6 months     GOALS:  Goals reviewed with patient? Yes  SHORT TERM GOALS: ALL STGS = LTGS   LONG TERM GOALS: Target date: 09/10/2023  Pt will be independent with HEP for Parkinson's -specific exercises to improve functional mobility, transfers, and balance.  Baseline:  Goal status: MET  2.  Pt will perform at least 8 of 10 reps of sit<>stand transfers from surfaces <18 without UE support, independently, for improved functional strength with transfers from lower surfaces Baseline: from 14.5 box  Goal status: MET  3.  Pt will improve miniBEST to at least a 24/28 in order to demo decr fall risk. Baseline: 21/28  26/28 Goal status: MET  4.  Pt will improve TUG and TUG cog score to less than or equal to 10% difference  for improved dual task/decreased fall risk.  Baseline: TUG: 7.9, cog TUG: 12.1  TUG: 9.6, cog TUG: 10.5 seconds  Goal status: MET   ASSESSMENT:  CLINICAL IMPRESSION: Today's skilled session focused on assessing pt's LTGs for anticipated D/C. Pt has met all 4 LTGs. Pt able to stand up from 14.5 box surface with no UE support for 10 reps, but pt still reporting some difficulties with getting up from his sofa at home due to how soft it is, but has been using his PWR moves. Pt has improved his miniBEST from a 21/28 > 26/28, decr pt's risk for falls. Pt improved in his stepping strategies, gait with head turns, dual tasking, and stepping over obstacles. Reviewed pt's HEP for balance with pt with no further questions. Pt also attending multiple PD exercise classes during the week. Pt in agreement to D/C with plan for PD screens in 6 months.   OBJECTIVE IMPAIRMENTS: Abnormal gait, decreased activity tolerance, decreased balance, decreased coordination, decreased strength, and postural dysfunction.   ACTIVITY LIMITATIONS: locomotion level  PARTICIPATION LIMITATIONS: community activity  PERSONAL FACTORS: Age, Behavior pattern, Past/current experiences, Time since onset of injury/illness/exacerbation, and 3+ comorbidities: PD (Diagnosed 2018), HTN, HLD, OA, h/o melanoma, PVD   are also affecting patient's functional outcome.   REHAB POTENTIAL: Good  CLINICAL DECISION MAKING: Stable/uncomplicated  EVALUATION COMPLEXITY: Low  PLAN:  PT FREQUENCY: 2x/week  PT DURATION: 8 weeks  PLANNED INTERVENTIONS: 97164- PT Re-evaluation, 97110-Therapeutic exercises, 97530- Therapeutic activity, 97112- Neuromuscular re-education, 97535- Self Care, [id number]- Manual therapy, [phone]- Gait training, Patient/Family education, Balance training, Stair training, and Vestibular training  PLAN FOR NEXT SESSION D/C from PT     Sheffield LOISE Senate, PT, DPT 09/22/2023, 9:47 AM

## 2023-10-25 ENCOUNTER — Other Ambulatory Visit (HOSPITAL_COMMUNITY): Payer: Self-pay

## 2023-10-29 ENCOUNTER — Ambulatory Visit: Payer: No Typology Code available for payment source | Admitting: Neurology

## 2023-11-04 ENCOUNTER — Ambulatory Visit (INDEPENDENT_AMBULATORY_CARE_PROVIDER_SITE_OTHER): Payer: No Typology Code available for payment source | Admitting: Neurology

## 2023-11-04 DIAGNOSIS — W19XXXA Unspecified fall, initial encounter: Secondary | ICD-10-CM | POA: Diagnosis not present

## 2023-11-04 DIAGNOSIS — K117 Disturbances of salivary secretion: Secondary | ICD-10-CM | POA: Diagnosis not present

## 2023-11-04 DIAGNOSIS — G20A1 Parkinson's disease without dyskinesia, without mention of fluctuations: Secondary | ICD-10-CM

## 2023-11-04 MED ORDER — RIMABOTULINUMTOXINB 5000 UNIT/ML IM SOLN
5000.0000 [IU] | Freq: Once | INTRAMUSCULAR | Status: AC
  Administered 2023-11-04: 5000 [IU] via INTRAMUSCULAR

## 2023-11-04 NOTE — Progress Notes (Signed)
 Assessment/Plan:   1.  Parkinsons Disease  -Continue carbidopa/levodopa 25/100, 2/2/1  -Continue carbidopa/levodopa 50/200 at bedtime for cramping of feet/legs  -Continue pramipexole 0.75 mg tid (this was overall increase and changed from the ER formulation).  No compulsive behaviors.   -Genetic testing completed.  Heterozygous for GBA mutation.  This could provide a risk factor for inherited Parkinson's, but likely would also require an environmental component.  This is different from homozygous carrier, which would have a much greater risk factor (10-20 times).   2.  History of malignant melanoma  -Continues to see dermatology several times per year.  3.  Sialorrhea  -on myobloc, which was completed today  -Continue Robinul, 1 mg twice per day.  No side effects currently.  -he's still struggling some despite the above but probably nothing more to do here  4.  Bph  -s/p TURP in 09/2022  -following with urology  5.  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  On several BP medications and doesn't sound like BP running low  -discuss with pcp  -robinul may contribute?? -   6.  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  Do not suspect dementia.  Meds may contribute  -He went up to the front following the visit today to move up the date of his neurocognitive testing.  7.  Fall  -Patient had injury to left hand and I encouraged him to go to urgent care once he left here and he noted that he was going to do that.  Subjective:   Zachary Wang. was seen today.  Was initially just scheduled for Botox today, but noted that patient had injury around the left eye.  He noted that he was visiting his grandchildren and [place] and tripped over a curb and had sunglasses on and that is what cut the bottom of the eye.  That was not the main injury.  He then showed me his left hand.  It is swollen and he is having pain over the left hand and thumb.  Current  prescribed movement disorder medications: Pramipexole, 0.75 mg 3 times per day (was a dose increase from his ER formulation) Carbidopa/levodopa 50/200 CR at bedtime  Robinul, 1 mg twice per day   ALLERGIES:  No Known Allergies  CURRENT MEDICATIONS:  Outpatient Encounter Medications as of 11/04/2023  Medication Sig   acetaminophen (TYLENOL) 650 MG CR tablet Take 650 mg by mouth daily as needed for pain.   amLODipine (NORVASC) 5 MG tablet Take 5 mg by mouth every morning.   atorvastatin (LIPITOR) 20 MG tablet Take 20 mg by mouth every evening.    calcium carbonate (TUMS - DOSED IN MG ELEMENTAL CALCIUM) 500 MG chewable tablet Chew 2 tablets by mouth daily as needed for indigestion or heartburn.   carbidopa-levodopa (SINEMET CR) 50-200 MG tablet Take 1 tablet by mouth at bedtime.   carbidopa-levodopa (SINEMET IR) 25-100 MG tablet 2 at 7am, 2 at 11am, 1 at 4pm   docusate sodium (COLACE) 100 MG capsule Take 1 capsule (100 mg total) by mouth daily as needed for up to 30 doses.   glycopyrrolate (ROBINUL) 1 MG tablet Take 1 tablet (1 mg total) by mouth 2 (two) times daily.   hydrochlorothiazide (HYDRODIURIL) 25 MG tablet Take 25 mg by mouth every morning.    ibuprofen (ADVIL) 200 MG tablet Take 200 mg by mouth daily as needed for moderate pain.   losartan (COZAAR) 100 MG tablet  Take 50 mg by mouth 2 (two) times daily.   oxyCODONE-acetaminophen (PERCOCET) 5-325 MG tablet Take 1 tablet by mouth every 4 (four) hours as needed for up to 12 doses for severe pain.   Polyvinyl Alcohol (LIQUID TEARS OP) Place 1 drop into both eyes daily as needed (irritation).   pramipexole (MIRAPEX) 0.75 MG tablet Take 1 tablet (0.75 mg total) by mouth 3 (three) times daily.   tamsulosin (FLOMAX) 0.4 MG CAPS capsule Take 0.4 mg by mouth at bedtime.   [EXPIRED] Botulinum Toxin Type B SOLN 5,000 Units    No facility-administered encounter medications on file as of 11/04/2023.    Objective:   PHYSICAL EXAMINATION:     VITALS:   There were no vitals filed for this visit.   GEN:  The patient appears stated age and is in NAD. HEENT:  Normocephalic.  Small, healing abrasion under the left eye.  No ecchymosis there.  The mucous membranes are moist. The superficial temporal arteries are without ropiness or tenderness. MS:  he does have LE edema (similar to prior).  Left hand is swollen, with ecchymosis in various stages of healing.  There is limited range of motion, particularly among the thumb, with associated pain when attempts are made at moving the thumb and attempts at making a fist.  Neurological examination:  Orientation: The patient is alert and oriented x3. Cranial nerves: There is good facial symmetry with facial hypomimia. The speech is fluent and clear.  Motor: Strength is at least antigravity x4.  Movement examination: Abnormal movements: none Gait and Station: The patient ambulates well in the hall today with good arm swing    Cc:  Chilton Greathouse, MD

## 2023-11-04 NOTE — Procedures (Signed)
 [facility]    History:  Diagnosis: Sialorrhea    Result History  Doing well   Consent obtained from: The patient The patient was educated on the botulinum toxin the black blox warning and given a copy of the botox patient medication guide.  The patient understands that this warning states that there have been reported cases of the Botox extending beyond the injection site and creating adverse effects, similar to those of botulism. This included loss of strength, trouble walking, hoarseness, trouble saying words clearly, loss of bladder control, trouble breathing, trouble swallowing, diplopia, blurry vision and ptosis. Most of the distant spread of Botox was happening in patients, primarily children, who received medication for spasticity or for cervical dystonia. The patient expressed understanding and desire to proceed.     Injections  Location Left  Right Units Number of sites  Submandibular gland 250 250 500 1 per side  Parotid 2250 2250 2500 1 per side  TOTAL UNITS:     5000      Type of Toxin: Myobloc type B As ordered and injected IM at today's visit Total Units: 5000  Discarded Units: 0  Needle drawback with each injection was free of blood. Pt tolerated procedure well without complications.   Reinjection is anticipated in 3 months.

## 2023-12-07 ENCOUNTER — Other Ambulatory Visit: Payer: Self-pay

## 2023-12-07 ENCOUNTER — Telehealth: Payer: Self-pay | Admitting: Neurology

## 2023-12-07 DIAGNOSIS — K117 Disturbances of salivary secretion: Secondary | ICD-10-CM

## 2023-12-07 DIAGNOSIS — R42 Dizziness and giddiness: Secondary | ICD-10-CM

## 2023-12-07 DIAGNOSIS — G20A1 Parkinson's disease without dyskinesia, without mention of fluctuations: Secondary | ICD-10-CM

## 2023-12-07 MED ORDER — GLYCOPYRROLATE 1 MG PO TABS: 1.0000 mg | ORAL_TABLET | Freq: Two times a day (BID) | ORAL | 1 refills | Status: DC

## 2023-12-07 NOTE — Telephone Encounter (Signed)
 Printed and in Dr. Mela Spinner box

## 2023-12-07 NOTE — Telephone Encounter (Signed)
 [facility] medication glycoprrolate so that they can fill it

## 2023-12-08 ENCOUNTER — Telehealth: Payer: Self-pay

## 2023-12-08 NOTE — Telephone Encounter (Signed)
 [facility] [id number]

## 2023-12-08 NOTE — Telephone Encounter (Signed)
 ERROR

## 2023-12-09 ENCOUNTER — Ambulatory Visit: Payer: Self-pay | Admitting: Psychology

## 2023-12-09 ENCOUNTER — Ambulatory Visit (INDEPENDENT_AMBULATORY_CARE_PROVIDER_SITE_OTHER): Admitting: Psychology

## 2023-12-09 DIAGNOSIS — G20A1 Parkinson's disease without dyskinesia, without mention of fluctuations: Secondary | ICD-10-CM | POA: Diagnosis not present

## 2023-12-09 DIAGNOSIS — G3184 Mild cognitive impairment, so stated: Secondary | ICD-10-CM | POA: Diagnosis not present

## 2023-12-09 DIAGNOSIS — R4189 Other symptoms and signs involving cognitive functions and awareness: Secondary | ICD-10-CM

## 2023-12-09 NOTE — Progress Notes (Signed)
   Psychometrician Note   Cognitive testing was administered to Zachary C Highbaugh Jr. by Arnulfo Larch, B.S. (psychometrist) under the supervision of Dr. Janice Meeter, Psy.D., licensed psychologist on 12/09/2023. Mr. Garden did not appear overtly distressed by the testing session per behavioral observation or responses across self-report questionnaires. Rest breaks were offered.    The battery of tests administered was selected by Dr. Janice Meeter, Psy.D. with consideration to Mr. Therien current level of functioning, the nature of his symptoms, emotional and behavioral responses during interview, level of literacy, observed level of motivation/effort, and the nature of the referral question. This battery was communicated to the psychometrist. Communication between Dr. Janice Meeter, Psy.D. and the psychometrist was ongoing throughout the evaluation and Dr. Janice Meeter, Psy.D. was immediately accessible at all times. Dr. Janice Meeter, Psy.D. provided supervision to the psychometrist on the date of this service to the extent necessary to assure the quality of all services provided.    Liisa Reeves. will return within approximately 1-2 weeks for an interactive feedback session with Dr. Donavon Fudge at which time his test performances, clinical impressions, and treatment recommendations will be reviewed in detail. Mr. Gladu understands he can contact our office should he require our assistance before this time.  A total of 125 minutes of billable time were spent face-to-face with Mr. Cardenas by the psychometrist. This includes both test administration and scoring time. Billing for these services is reflected in the clinical report generated by Dr. Janice Meeter, Psy.D.  This note reflects time spent with the psychometrician and does not include test scores or any clinical interpretations made by Dr. Donavon Fudge. The full report will follow in a separate note.

## 2023-12-09 NOTE — Progress Notes (Unsigned)
 NEUROPSYCHOLOGICAL EVALUATION [place]  [place] Department of Neurology  Date of Evaluation: 12/09/2023  REASON FOR REFERRAL   Zachary Wang is an 83 year old, right-handed, White male with 16 years of formal education. He was referred for neuropsychological evaluation by his neurologist, Ivette Marks Tat, D.O., to assess current neurocognitive functioning, document potential cognitive deficits, and assist with treatment planning. This is his first neuropsychological evaluation.  SUMMARY OF RESULTS   Premorbid cognitive abilities are estimated to be in the low average to average range based on word reading and sociodemographic factors. On assessment today, performance was below expectations on tasks of rapid word reading, phonemic fluency, response inhibition/switching, and visual learning/memory. Regarding the latter, while his immediate recall score for the shapes was within normal limits, it likely overestimates his learning, as he did not consistently recall the same shapes across encoding trials. This inconsistency likely limited his delayed recall and recognition. In contrast, he was able to effectively encode, recall, and recognize the word list and short stories. Remaining scores on measures of auditory and visual attention/working memory, processing speed, semantic fluency, confrontation naming, and visuospatial skills were within expectations. On self-report questionnaires, he did not endorse clinically-elevated levels of depression or anxiety.  DIAGNOSTIC IMPRESSION   Results of the current evaluation indicated weaknesses in aspects of executive functioning and visual learning/memory (primarily due to poor encoding and retrieval). 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 The cognitive profile is most  consistent with Parkinson's disease, although vascular factors may be contributing as well (no brain imaging has been completed to date to confirm this). The pattern of findings is not suggestive of an Alzheimer's disease process at this time. Notably, the patient and his wife reported recent episodes of visual hallucinations and confusion, along with increased vulnerability to scams and difficulty managing finances. I briefly consulted with his neurologist to share these updates; she has an upcoming appointment with the patient in May and plans to discuss potential medication adjustments to address these symptoms. Given the functional concerns, I recommend a follow-up evaluation in approximately one year to monitor for any progression. Improvement of symptoms following medication changes would be a positive indicator.  ICD-10 Codes: G31.84 Mild cognitive impairment; G20.A1 Parkinson's disease   RECOMMENDATIONS   A repeat neuropsychological evaluation in 12-18 months (or sooner if functional decline is noted) is recommended.  If he has not already, the patient is encouraged to discuss episodes of diplopia with his doctors to ensure thorough evaluation and appropriate management. In the meantime, it is important that he continue to avoid driving when experiencing diplopia or if he feels his vision is unclear to ensure his safety and the safety of others.   Consider a referral for a sleep study to further evaluate episodes of interrupted breathing during sleep, as poor sleep can contribute to daytime fatigue, cognitive problems, and low mood.  Continue to have a trusted family member assist with managing finances and medical appointments to prevent potential errors. Since he organizes his medications in a pillbox, it would be helpful for his wife to periodically check in on his adherence to ensure that errors do not occur.  Given the patient's increased vulnerability to scams, it is recommended that  family members regularly monitor his communications, particularly in relation to unsolicited phone calls, emails, and online interactions. Here are some specific examples of actions family members  can take to help protect the patient from scams:  -Block unsolicited calls and emails: Set up call-blocking features on the phone to prevent robocalls or unsolicited calls. Family members can also assist with filtering suspicious emails and ensure that any links or attachments are not clicked.  -Establish a verification process: When the patient receives a phone call or email from someone asking for money, family members can implement a rule to always double-check with them before making any decisions. For example, they can ask the patient to call a trusted family member or friend for advice before engaging with the caller.  -Educate about common scams: Teach the patient to recognize red flags, such as offers that seem too good to be true, requests for urgent payments, or pressure to act quickly. Emphasize that legitimate companies will never demand payment by gift cards, wire transfers, or through "too good to be true" deals.  Prioritize managing vascular risk factors through a heart healthy diet, physician-approved physical activity, and medication adherence.   Continue participating in activities that you find enjoyable and fulfilling, whether that be hobbies, socializing with loved ones, or being outdoors. This can improve mood, increase motivation, and offer cognitive stimulation.  Consider implementing compensatory strategies to maximize independence and maintain daily functioning. Examples include:   -Adhere to routine. Compensatory strategies work best when they are used consistently. Use a planner, calendar, or white board that has the schedule and important events for the day clearly listed to reference and cross off when tasks are complete.   -Ask for written information, especially if it is new  or unfamiliar (e.g., information provided at a doctor's appointment).   -Create an organized environment. Keep items that can be easily misplaced in a sensible location and get into the habit of always returning the items to those places.   -Pay attention and reduce distractions. Make a point of focusing attention on information you want to remember. One-on-one interaction is more likely to facilitate attention and minimize distraction. Make eye contact and repeat the information out loud after you hear it. Reduce interruptions or distractions especially when attempting to learn new information.   -Create associations. When learning something new, think about and understand the information. Explain it in your own words or try to associate it with something you already know. Take notes to help remember important details.  -Evaluate goals and plan accordingly. When confronted by many different tasks, begin by making a list that prioritizes each task and estimates the time it will take to complete. Break down complicated tasks into smaller, more manageable steps.   -Focus on one task at a time and complete each task before starting another. Avoid multitasking.   DISPOSITION   Patient will follow up with the referring provider, Dr. Winferd Hatter. He should return for repeat neuropsychological testing in 12-18 months to monitor his course and assist with diagnosis and treatment planning. He and his wife will be provided verbal feedback in approximately one week regarding the findings and impression during this visit.  The remainder of the report includes the details of the patient's background and a table of results from the current evaluation, which support the summary and recommendations described above.  BACKGROUND   History of Presenting Illness: The following information was obtained from a review of medical records and an interview with the patient and his wife, Zachary Wang. Patient is followed by neurology for  management of Parkinson's disease, initially diagnosed in 2018. Please see the medical record for additional details regarding  the neurologist's observations and clinical impressions. During a visit in November 2024, concerns were raised about mild word-finding difficulties and memory changes, prompting referral for neuropsychological evaluation.  Cognitive Functioning: During today's appointment, the patient and his wife denied significant cognitive concerns. Both acknowledged some mild word- and name-finding difficulties, though the wife felt the degree of these difficulties is likely age-appropriate. Patient further endorsed occasionally misplacing items and noted some "slipping" in his organizational abilities, particularly with paperwork. He otherwise denied problems with short-term memory, attention, navigation, and planning. His wife agreed that there has been some decline with organization, describing recent confusion managing bill payments. She further reported that he sometimes misplaces items and often has difficulty locating objects that are directly in front of him. While the patient did not express concern about navigational abilities, his wife observed decline in this area. She mentioned that navigation was never his strength, but recently, he has struggled to figure out where to drive or how to get to certain places. She also noted episodes of confusion, particularly when the patient first wakes up or in the evening. For example, after speaking with a grandchild on the phone, he has sometimes asked where the child is, mistakenly believing they were present in person. Finally, his wife noted increased vulnerability to scams, which is discussed below (see Functional Status). Patient reported that cognitive changes have emerged over the past 9-12 months, whereas his wife described changes over the past few years.  Physical Functioning: Patient denied difficulties with sleep initiation and  maintenance. His wife noted that he occasionally acts out his dreams and sometimes talks in his sleep. She has also observed irregular breathing patterns during his sleep, including episodes where he stops breathing. Patient believes his appetite is stable, though his wife reports it is not as substantial as it once was. He endorsed anosmia and a diminished sense of taste. His vision is good, although he began noticing diplopia last fall while driving, with another episode occurring two weeks ago. Hearing remains stable. He feels unsteady on his feet and has experienced a few recent falls. Additionally, the patient provided a printed summary outlining several Parkinson's symptoms, some mentioned above, including increased tremors now affecting the right arm, persistent constipation despite MiraLAX , occasional soft speech, diplopia, continued issues with drooling (particularly toward the end of his Botox injections), difficulty with walking, episodes of freezing (i.e., feeling like his feet are stuck), and dizziness (especially when bending over during activities like golf or yard work).  Psychological/Emotional Functioning: Patient described his current mood as "good." He denied suicidal ideation. He generally tries to remain active and regularly participates in physical therapy, occupational therapy, speech therapy, yard work, Systems analyst, and exercise classes such as Pyro, Tai Chi, and Spin. His wife has observed increased anxiety in situations where the patient needs to be prompt or on time. She noted that he feels pressured to arrive early in case something goes wrong. He agreed, stating that he feels anxious when rushed, but if given enough time, his anxiety is reduced. When asked about visual hallucinations, the patient endorsed experiencing such phenomena. He typically sees small animals--such as a squirrel or a fox--briefly darting across the room. Hallucinations are not always clearly formed, and he recognizes  them as hallucinations. These episodes can occur at any time of day.  Imaging: Patient has no history of brain or head imaging.  Other Relevant Medical History: Remarkable for benign prostatic hyperplasia, hypertension, and malignant melanoma. No history of stroke, CNS infection, head injury,  or seizure was reported.  Current Medications: Per record, acetaminophen , amlodipine , atorvastatin , calcium  carbonate, carbidopa -levodopa , docusate sodium , glycopyrrolate , hydrochlorothiazide , ibuprofen, losartan , oxycodone -acetaminophen , pramipexole , and tamsulosin .   Functional Status: Patient has recently begun requiring assistance with certain instrumental activities of daily living. He continues to drive and has not reported any accidents, tickets, or episodes of getting lost. However, since last fall, he has experienced a few episodes of diplopia while driving. When this occurs, he immediately stops driving due to difficulty staying in his lane. He has made errors in financial management (e.g., missed and duplicate payments), prompting his wife to assume responsibility for bill payments, though he remains involved in oversight. He manages his own medications and denies significant issues aside from occasionally forgetting a dose. His wife noted that she does not routinely monitor his medication adherence, so potential issues cannot be ruled out. He denies difficulty operating household appliances but has some challenges with technology, such as using the computer or television remote. His wife specifically highlighted an increased vulnerability to scams, such as responding to suspicious phone calls and emails, and having his computer remotely accessed by scammers. He remains independent with all basic activities of daily living, although fine motor tasks may take him longer to complete.  Family Neurological History: Remarkable for tremors in the patient's mother and maternal grandmother, though the patient was  uncertain if this was due to Parkinson's disease as there was a lack of medical evaluation. Wife also noted that the patient's mother had dementia, likely due to Alzheimer's disease.  Psychiatric History: History of depression, anxiety, prior mental health treatment, suicidal ideation, and psychiatric hospitalizations was not reported. He did not have a history of hallucinations until recently (described above in the Psychological/Emotional Functioning section).  Substance Use History: Patient reported infrequent alcohol consumption. Current use of nicotine, marijuana, and illicit substances was denied.  Social and Developmental History: Patient was born in [place], [place]. History of perinatal complications and developmental delays was not reported. He is married with three children. He currently lives with his wife in their private residence.  Educational and Occupational History: No history of childhood learning disability, special education services, or grade retention was reported. Patient obtained a bachelor's degree in business administration. 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 He retired in 2018.  BEHAVIORAL OBSERVATIONS   Patient arrived on time and was accompanied by his wife, Zachary Wang. He ambulated independently. Gait was slow. He was alert and fully oriented. He was appropriately groomed and dressed for the setting. No significant sensory or motor abnormalities were observed. Vision and hearing were adequate for testing purposes. Speech was of normal rate, prosody, and volume. No conversational word-finding difficulties, paraphasic errors, or dysarthria were observed. Comprehension was conversationally intact. Thought processes were linear, logical, and coherent. Thought content was organized and devoid of delusions.  Insight appeared appropriate. Affect was even and congruent with mood. He was cooperative and gave adequate effort during testing, including on standalone and embedded measures of performance validity. Results are thought to accurately reflect his cognitive functioning at this time.  NEUROPSYCHOLOGICAL TESTING RESULTS   Tests Administered: Animal Naming Test; Brief Visuospatial Memory Test-Revised (BVMT-R) - Form 1; Controlled Oral Word Association Test (COWAT): FAS; Delis-Kaplan Executive Function System (D-KEFS) - Subtest(s): Color-Word Interference Test; Geriatric Anxiety Scale-10 Item (GAS-10); Geriatric Depression Scale Short Form (GDS-SF); Hopkins Verbal Learning Test  Revised (HVLT-R) - From 1; Neuropsychological Assessment Battery (NAB) - Subtest(s): Naming Form 1, Repeatable Battery for the Assessment of Neuropsychological Status Update (RBANS Update) Form A - Subtest(s): Line Orientation; Standalone performance validity test (PVT); Test of Premorbid Functioning (TOPF); Trail Making Test (TMT); Wechsler Adult Intelligence Scale Fifth Edition (WAIS-5) - Subtest(s): Similarities, Clinical cytogeneticist, Matrix Reasoning, Digits Forward, Digit Sequencing, Running Digits, Digits Backward, Symbol Span, Naming Speed Quantity; and Wechsler Memory Scale Fourth Edition (WMS-IV) - Subtest(s): Logical Memory (LM).  Test results are provided in the table below. Whenever possible, the patient's scores were compared against age-, sex-, and education-corrected normative samples. Interpretive descriptions are based on the AACN consensus conference statement on uniform labeling (Guilmette et al., 2020).  PREMORBID FUNCTIONING RAW  RANGE  TOPF 23 StdS=87 Low Average  ATTENTION & WORKING MEMORY RAW  RANGE  WAIS-5 Digit Sequencing -- ss=9 Average  WAIS-5 Running Digits -- ss=10 Average  WAIS-5 Digits Forward -- ss=9 Average  WAIS-5 Digits Backward -- ss=8 Average  WAIS-5 Symbol Span -- ss=12 High Average  PROCESSING SPEED  RAW  RANGE  Trails A 57''0e T=37 Low Average  WAIS-5 Naming Speed Quantity -- ss=10 Average  DKEFS CWIT Color Naming 42''1e ss=8 Average  DKEFS CWIT Word Reading 36''1e ss=5 Below Average  EXECUTIVE FUNCTION RAW  RANGE  Trails B 118''0e T=47 Average  WAIS-5 Matrix Reasoning -- ss=11 Average  WAIS-5 Similarities -- ss=6 Low Average  COWAT Letter Fluency 7+5+7 T=32 Below Average  DKEFS CWIT Inhibition 92''3e ss=10 Average  DKEFS CWIT Inhibition/Switching 154''8e ss=3 Exceptionally Low  LANGUAGE RAW  RANGE  COWAT Letter Fluency 7+5+7 T=32 Below Average  Animal Naming Test 17 T=50 Average  NAB Naming Test 29/31 T=50 WNL  VISUOSPATIAL RAW    RBANS Line Orientation -- >75%ile High Average to Exceptionally High  WAIS-5 Block Design -- ss=11 Average  BVMT-R Copy Trial 12/12 -- WNL  VERBAL LEARNING & MEMORY RAW  RANGE  HVLT Learning Trials (5+9+10)/36 T=52 Average  HVLT Delayed Recall 9/12 T=53 Average  HVLT Recognition Hits 11 -- --  HVLT Recognition False Positives 0 -- --  HVLT Discrimination Index 11 T=56 Average  WMS-IV LM-I  (7+10+11)/53 ss=10 Average  WMS-IV LM-II  (7+10)/39 ss=11 Average  WMS-IV LM Recognition  (7+12)/23 51-75%ile Average  VISUOSPATIAL LEARNING & MEMORY RAW  RANGE  BVMT-R Total Recall (2+5+4)/36 T=41 Low Average  BVMT-R Delayed Recall 1/12 T=30 Below Average  BVMT-R Recognition Hits 2 -- --  BVMT-R Recognition False Alarms 0 -- --  BVMT-R Recognition Discrimination Index 2 T=20 Exceptionally Low  *From Powell et al. (2022) -- -- --  QUESTIONNAIRES RAW  RANGE  GDS-SF 2 -- Minimal  GAS-10 5 -- Minimal  *Note: ss = scaled score; StdS = standard score; T = t-score; C/S = corrected raw score; WNL = within normal limits; BNL= below normal limits; D/C = discontinued. Scores from skewed distributions are typically interpreted as WNL (>=16th %ile) or BNL (<16th %ile).   INFORMED CONSENT   Patient was provided with a verbal description of the nature and purpose of the  neuropsychological evaluation. Also reviewed were the foreseeable risks and/or discomforts and benefits of the procedure, limits of confidentiality, and mandatory reporting requirements of this provider. Patient was given the opportunity to have their questions answered. Oral consent to participate was provided by the patient.   This report was prepared as part of a clinical evaluation and is not intended for forensic use.  SERVICE   This evaluation was conducted  by Janice Meeter, Psy.D. In addition to time spent directly with the patient, total professional time includes record review, integration of relevant medical history, test selection, interpretation of findings, and report preparation. A technician, Zachary Wang, B.S., provided testing and scoring assistance for 125 minutes.  Psychiatric Diagnostic Evaluation Services (Professional): 16109 x 1 Neuropsychological Testing Evaluation Services (Professional): 60454 x 1 Neuropsychological Testing Evaluation Services (Professional): 09811 x 2 Neuropsychological Test Administration and Scoring (Technician): [phone] x 1 Neuropsychological Test Administration and Scoring (Technician): [phone] x 3  This report was generated using voice recognition software. While this document has been carefully reviewed, transcription errors may be present. I apologize in advance for any inconvenience. Please contact me if further clarification is needed.            Janice Meeter, Psy.D.             Neuropsychologist

## 2023-12-16 ENCOUNTER — Ambulatory Visit (INDEPENDENT_AMBULATORY_CARE_PROVIDER_SITE_OTHER): Admitting: Psychology

## 2023-12-16 DIAGNOSIS — G20A1 Parkinson's disease without dyskinesia, without mention of fluctuations: Secondary | ICD-10-CM | POA: Diagnosis not present

## 2023-12-16 DIAGNOSIS — G3184 Mild cognitive impairment, so stated: Secondary | ICD-10-CM

## 2023-12-16 NOTE — Progress Notes (Signed)
   NEUROPSYCHOLOGY FEEDBACK SESSION . [facility]  [place] Department of Neurology  Date of Feedback Session: 12/16/2023  REASON FOR REFERRAL   Zachary Wang is an 83 year old, right-handed, White male with 16 years of formal education. He was referred for neuropsychological evaluation by his neurologist, Zachary Wang, D.O., to assess current neurocognitive functioning, document potential cognitive deficits, and assist with treatment planning. This is his first neuropsychological evaluation.  FEEDBACK   Patient completed a comprehensive neuropsychological evaluation on 12/09/2023. Please refer to that encounter for the full report and recommendations. Briefly, results indicated weaknesses in aspects of executive functioning and visual learning/memory (primarily due to poor encoding and retrieval). 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 The cognitive profile is most consistent with Parkinson's disease, although vascular factors may be contributing as well (no brain imaging has been completed to date to confirm this). The pattern of findings is not suggestive of an Alzheimer's disease process at this time. Notably, the patient and his wife reported recent episodes of visual hallucinations and confusion, along with increased vulnerability to scams and difficulty managing finances. I briefly consulted with his neurologist to share these updates; she has an upcoming appointment with the patient in May and plans to discuss potential medication adjustments to address these symptoms. Given the functional concerns, I recommend a follow-up evaluation in approximately one year to monitor for any progression. Improvement of symptoms following medication changes would be a positive indicator.  Today, the patient was accompanied by his  wife. They were provided verbal feedback regarding the findings and impression during this visit, and their questions were answered. A copy of the report was provided at the conclusion of the visit.  DISPOSITION   Patient will follow up with the referring provider, Dr. Winferd Hatter. He should return for repeat neuropsychological testing in 12-18 months to monitor his course and assist with diagnosis and treatment planning.  SERVICE   This feedback session was conducted by Janice Meeter, Psy.D. [place] [id number] was billed for Dr. Donavon Fudge' time spent in preparing, conducting, and documenting the current feedback session.  This report was generated using voice recognition software. While this document has been carefully reviewed, transcription errors may be present. I apologize in advance for any inconvenience. Please contact me if further clarification is needed.

## 2024-01-05 NOTE — Progress Notes (Unsigned)
 Assessment/Plan:   1.  Parkinsons Disease  -Continue carbidopa /levodopa  25/100, 2/2/1  -Continue carbidopa /levodopa  50/200 at bedtime for cramping of feet/legs  -*** pramipexole  0.75 mg tid (this is overall increase and changed from the ER formulation).  No compulsive behaviors.   -Genetic testing completed.  Heterozygous for GBA mutation.  This could provide a risk factor for inherited Parkinson's, but likely would also require an environmental component.  This is different from homozygous carrier, which would have a much greater risk factor (10-20 times).   2.  History of malignant melanoma  -Continues to see dermatology several times per year.  3.  Sialorrhea  -on myobloc , but discussed that this is now on national backorder.  We may have to switch to Xeomin.  - Decrease Robinul  to 1 mg once per day for 1 week and then stop  - With the changes above, he is certainly going to notice more drooling.  Xeomin tends not to be as effective as Myobloc  and we are having to wean off the Robinul  because of memory change.  -he's still struggling some despite the above but probably nothing more to do here  4.  Bph  -s/p TURP in 09/2022  -following with urology  5.  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  On several BP medications and doesn't sound like BP running low  -discuss with pcp  -robinul  may contribute?? -   6.  MCI  - Neurocognitive testing with Dr. Donavon Fudge in April, 2025 with evidence of MCI.  - Patient noting visual hallucinations, which is likely related to either Robinul  or pramipexole .  We should begin to taper these medications.  I am going to start with the Robinul  and stop that one today.  I am going to look at the pramipexole  next visit.    Subjective:   Zachary Wang. was seen today in follow up for Parkinsons disease.  My previous records were reviewed prior to todays visit as well as outside records available to me.   Patient did have a fall in March while he was in [place].  He came in for his routine Botox for sialorrhea not long thereafter and I told him that he needed to get that checked out and he ended up going to see Dr. Annamae Barrett.  He ended up fracturing the thumb and was placed in a short arm thumb spica splint for 3 weeks.  He did well with that and is feeling much better in that regard.  He had neurocognitive testing with Dr. Donavon Fudge since our last visit.  I spoke to her about this testing and about his wife's reports.  Patient's diagnosis after testing was MCI.  He did report visual hallucinations to her.  Last visit, we changed him from the extended release formulation (due to cost) and changed him to regular pramipexole .  When we did that, we slightly increased the dosage.  Current prescribed movement disorder medications: Carbidopa /levodopa  25/100, 2/2/1 Pramipexole  0.75 mg 3 times per day (increased) Carbidopa /levodopa  50/200 CR at bedtime  Robinul , 1 mg twice per day   ALLERGIES:  No Known Allergies  CURRENT MEDICATIONS:  Outpatient Encounter Medications as of 01/11/2024  Medication Sig   acetaminophen  (TYLENOL ) 650 MG CR tablet Take 650 mg by mouth daily as needed for pain.   amLODipine  (NORVASC ) 5 MG tablet Take 5 mg by mouth every morning.   atorvastatin  (LIPITOR) 20 MG tablet Take 20 mg by mouth every evening.  calcium  carbonate (TUMS - DOSED IN MG ELEMENTAL CALCIUM ) 500 MG chewable tablet Chew 2 tablets by mouth daily as needed for indigestion or heartburn.   carbidopa -levodopa  (SINEMET  CR) 50-200 MG tablet Take 1 tablet by mouth at bedtime.   carbidopa -levodopa  (SINEMET  IR) 25-100 MG tablet 2 at 7am, 2 at 11am, 1 at 4pm   docusate sodium  (COLACE) 100 MG capsule Take 1 capsule (100 mg total) by mouth daily as needed for up to 30 doses.   glycopyrrolate  (ROBINUL ) 1 MG tablet Take 1 tablet (1 mg total) by mouth 2 (two) times daily.   hydrochlorothiazide  (HYDRODIURIL ) 25 MG tablet Take 25 mg  by mouth every morning.    ibuprofen (ADVIL) 200 MG tablet Take 200 mg by mouth daily as needed for moderate pain.   losartan  (COZAAR ) 100 MG tablet Take 50 mg by mouth 2 (two) times daily.   oxyCODONE -acetaminophen  (PERCOCET) 5-325 MG tablet Take 1 tablet by mouth every 4 (four) hours as needed for up to 12 doses for severe pain.   Polyvinyl Alcohol (LIQUID TEARS OP) Place 1 drop into both eyes daily as needed (irritation).   pramipexole  (MIRAPEX ) 0.75 MG tablet Take 1 tablet (0.75 mg total) by mouth 3 (three) times daily.   tamsulosin  (FLOMAX ) 0.4 MG CAPS capsule Take 0.4 mg by mouth at bedtime.   No facility-administered encounter medications on file as of 01/11/2024.    Objective:   PHYSICAL EXAMINATION:    VITALS:   There were no vitals filed for this visit.   GEN:  The patient appears stated age and is in NAD. HEENT:  Normocephalic, atraumatic.  The mucous membranes are moist. The superficial temporal arteries are without ropiness or tenderness. CV:  RRR Lungs:  CTAB Neck/HEME:  There are no carotid bruits bilaterally. MS:  he does have LE edema (similar to prior)  Neurological examination:  Orientation: The patient is alert and oriented x3. Cranial nerves: There is good facial symmetry with facial hypomimia. The speech is fluent and clear. Soft palate rises symmetrically and there is no tongue deviation. Hearing is intact to conversational tone. Sensation: Sensation is intact to light touch throughout Motor: Strength is at least antigravity x4.  Movement examination: Tone: There is mild increased tone in the LUE  Abnormal movements: none Coordination:  There is mild decremation, with any form of RAMS, including alternating supination and pronation of the forearm, hand opening and closing, finger taps, heel taps and toe taps on the L Gait and Station: The patient ambulates well in the hall today with good arm swing   Total time spent on today's visit was *** minutes,  including both face-to-face time and nonface-to-face time.  Time included that spent on review of records (prior notes available to me/labs/imaging if pertinent), discussing treatment and goals, answering patient's questions and coordinating care.    Cc:  Avva, Ravisankar, MD

## 2024-01-11 ENCOUNTER — Ambulatory Visit: Payer: No Typology Code available for payment source | Admitting: Physical Therapy

## 2024-01-11 ENCOUNTER — Ambulatory Visit (INDEPENDENT_AMBULATORY_CARE_PROVIDER_SITE_OTHER): Payer: No Typology Code available for payment source | Admitting: Neurology

## 2024-01-11 ENCOUNTER — Encounter: Payer: Self-pay | Admitting: Neurology

## 2024-01-11 ENCOUNTER — Ambulatory Visit: Payer: No Typology Code available for payment source | Admitting: Speech Pathology

## 2024-01-11 ENCOUNTER — Ambulatory Visit: Payer: No Typology Code available for payment source | Attending: Internal Medicine | Admitting: Occupational Therapy

## 2024-01-11 VITALS — BP 132/70 | HR 72 | Ht 74.0 in | Wt 211.6 lb

## 2024-01-11 DIAGNOSIS — G3184 Mild cognitive impairment, so stated: Secondary | ICD-10-CM

## 2024-01-11 DIAGNOSIS — G20A1 Parkinson's disease without dyskinesia, without mention of fluctuations: Secondary | ICD-10-CM | POA: Diagnosis not present

## 2024-01-11 DIAGNOSIS — R2681 Unsteadiness on feet: Secondary | ICD-10-CM

## 2024-01-11 DIAGNOSIS — R471 Dysarthria and anarthria: Secondary | ICD-10-CM

## 2024-01-11 DIAGNOSIS — R278 Other lack of coordination: Secondary | ICD-10-CM | POA: Insufficient documentation

## 2024-01-11 DIAGNOSIS — K117 Disturbances of salivary secretion: Secondary | ICD-10-CM | POA: Diagnosis not present

## 2024-01-11 NOTE — Therapy (Signed)
 [facility] [address] [place], [place], [address] Phone: [phone]   Fax:  [phone]  Patient Details  Name: Zachary Wang. MRN: [id number] Date of Birth: 11/21/1940 Referring Provider:  Lonzie Robins, MD  Encounter Date: 01/11/2024  Occupational Therapy Parkinson's Disease Screen  Hand dominance:  Rt   Physical Performance Test item #2 (simulated eating):  16 sec (slower w/ mild tremor)  Physical Performance Test item #4 (donning/doffing jacket):  23 sec (slower, however improves w/ cape method 2nd trial)  Fastening/unfastening 3 buttons in:  54 sec (much slower)   9-hole peg test:    RUE  37.89 sec  (11 sec slower than last admission)      LUE  60 sec (slower)   Change in ability to perform ADLs/IADLs:  some difficulty w/ dressing - especially button up shirts. Shaving difficult when tremors start  Other Comments:  Lt thumb was injured during a fall around first of April - Lt thumb can oppose to 4th digit. Limited in Lt wrist flexion (premorbid d/t OA).  Pt doing PWR! Moves class, Tai Chi, and Cycling class  Pt would benefit from occupational therapy evaluation due to  decline in coordination bilaterally and more difficulty w/ shaving, dressing. ([facility] ref[facility])   Velinda Getting, OT 01/11/2024, 1:20 PM  [facility] [address] [place], [place], [address] Phone: [phone]   Fax:  [phone]

## 2024-01-11 NOTE — Patient Instructions (Addendum)
 Decrease pramipexole  to 0.25 mg twice per day x 1 week and then decrease pramipexole  0.25 mg once per day for a week and then STOP pramipexole    Decrease glycopyrrolate  to 1 tablet daily for 1 week and the STOP the glycopyrrolate .  We are making the above changes because of hallucinations.  We will be changing myobloc  to Xeomin because of the national backorder for myobloc  (for drooling)  Picnic at [facility], June 18, 1-4pm.

## 2024-01-11 NOTE — Therapy (Signed)
 [facility] [address] [place], [place], [address] Phone: [phone]   Fax:  [phone]  Patient Details  Name: Zachary Wang. MRN: [id number] Date of Birth: 09-01-1940 Referring Provider:  Lonzie Robins, MD  Encounter Date: 01/11/2024   Physical Therapy Parkinson's Disease Screen   Timed Up and Go test: 7.8 seconds (previously 9.6 seconds)  10 meter walk test: 9 seconds (previously 9.75 seconds = 3.36 ft/sec)  5 time sit to stand test:11.5 seconds (previously 12 seconds)  Saw Dr. Winferd Hatter earlier today. Pt reports having some hallucinations and is going to be stopping some medications. Balance feels like it comes and goes. Had 2 falls - one was trying to get out of the water in the [place] and did not hurt himself. Doing the PWR moves classes, tai chi, and spin classes.   Patient does not require Physical Therapy services at this time.  Recommend Physical Therapy screen in 6 months. Encouraged pt to continue exercising and performing balance HEP and standing PWR moves at home    Seabron Cypress, PT, DPT 01/11/2024, 1:20 PM  [facility] [address] [place], [place], [address] Phone: [phone]   Fax:  [phone]

## 2024-01-11 NOTE — Therapy (Signed)
 [facility] [address] [place], [place], [address] Phone: [phone]   Fax:  [phone]  Patient Details  Name: Zachary Wang. MRN: [id number] Date of Birth: 10/10/1940 Referring Provider:  Lonzie Robins, MD  Encounter Date: 01/11/2024  Speech Therapy Parkinson's Disease Screen   Decibel Level today: 67 dB  (WNL=70-72 dB) with sound level meter 30cm away from pt's mouth. Pt's conversational volume has remained consistent since last treatment course  Pt does not report difficulty with swallowing, which does not warrant further evaluation. Did discuss   Pt does endorse changes in cognition. 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 Discussed benefit from SLP interventions for identifying functional manifestation and targeted strategy implementation.   Pt would benefit from cognitive communication and dysarthria evaluation. Pt desires to use VA benefits and will need VA referral and community care authorization. SLP to forward this request to primary care provider within [place] and asked pt to contact asking for ST referral.   Alston Jerry, CCC-SLP 01/11/2024, 1:05 PM   [facility] [address] [place], [place], [address] Phone: [phone]   Fax:  [phone]

## 2024-02-11 ENCOUNTER — Ambulatory Visit (INDEPENDENT_AMBULATORY_CARE_PROVIDER_SITE_OTHER): Admitting: Neurology

## 2024-02-11 DIAGNOSIS — K117 Disturbances of salivary secretion: Secondary | ICD-10-CM

## 2024-02-11 MED ORDER — INCOBOTULINUMTOXINA 100 UNITS IM SOLR
100.0000 [IU] | INTRAMUSCULAR | Status: AC
  Administered 2024-02-11: 100 [IU] via INTRAMUSCULAR

## 2024-02-11 NOTE — Procedures (Signed)
 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  He is somewhat frustrated given that Xeomin is only every 4 months and was already struggling some with Myobloc .  Consent obtained from: The patient  The patient was educated on the botulinum toxin the black blox warning and given a copy of the botox patient medication guide.  The patient understands that this warning states that there have been reported cases of the Botox extending beyond the injection site and creating adverse effects, similar to those of botulism. This included loss of strength, trouble walking, hoarseness, trouble saying words clearly, loss of bladder control, trouble breathing, trouble swallowing, diplopia, blurry vision and ptosis. Most of the distant spread of Botox was happening in patients, primarily children, who received medication for spasticity or for cervical dystonia. The patient expressed understanding and desire to proceed.   Injections  Location Left  Right Units Number of sites  Parotid (point 1 on picture) 30 30 60 1 per side  Submandibular (point 2) 20 20 40 1 per side  TOTAL UNITS:   100    Type of Toxin: Xeomin Discarded Units: 0  Needle drawback with each injection was free of blood. Pt tolerated procedure well without complications.   Reinjection is anticipated in 4 months.

## 2024-04-03 ENCOUNTER — Ambulatory Visit: Payer: Self-pay

## 2024-04-03 ENCOUNTER — Other Ambulatory Visit

## 2024-04-03 ENCOUNTER — Institutional Professional Consult (permissible substitution): Payer: No Typology Code available for payment source | Admitting: Psychology

## 2024-04-03 LAB — VITAMIN B12: Vitamin B-12: 326 pg/mL (ref 200–1100)

## 2024-04-04 ENCOUNTER — Ambulatory Visit: Payer: Self-pay | Admitting: Neurology

## 2024-04-07 ENCOUNTER — Telehealth: Payer: Self-pay | Admitting: Neurology

## 2024-04-07 NOTE — Telephone Encounter (Signed)
 Get auth for higher dose

## 2024-04-07 NOTE — Telephone Encounter (Signed)
 Patient said it is not helping at all and he is willing to try one more time but if it doesn't work next time he will just D/C

## 2024-04-07 NOTE — Telephone Encounter (Signed)
-----   Message from [place] Janeah Kovacich sent at 02/11/2024  8:38 AM EDT ----- Call patient and see how he did with the xeomin  for drooling.  If he didn't do as well, we may have to appeal and see if insurance will let us  increase to 150 U for the drooling

## 2024-04-10 ENCOUNTER — Encounter: Payer: No Typology Code available for payment source | Admitting: Psychology

## 2024-06-15 NOTE — Procedures (Unsigned)
 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  He is somewhat frustrated given that Xeomin  is only every 4 months and was already struggling some with Myobloc .  Consent obtained from: The patient  The patient was educated on the botulinum toxin the black blox warning and given a copy of the botox patient medication guide.  The patient understands that this warning states that there have been reported cases of the Botox extending beyond the injection site and creating adverse effects, similar to those of botulism. This included loss of strength, trouble walking, hoarseness, trouble saying words clearly, loss of bladder control, trouble breathing, trouble swallowing, diplopia, blurry vision and ptosis. Most of the distant spread of Botox was happening in patients, primarily children, who received medication for spasticity or for cervical dystonia. The patient expressed understanding and desire to proceed.   Injections  Location Left  Right Units Number of sites  Parotid (point 1 on picture) 30/15 30/15 90 1 per side  Submandibular (point 2) 20/10 20/10 60 1 per side  TOTAL UNITS:   150    Type of Toxin: Xeomin  Discarded Units: 0  Needle drawback with each injection was free of blood. Pt tolerated procedure well without complications.   Reinjection is anticipated in 4 months.

## 2024-06-16 ENCOUNTER — Ambulatory Visit (INDEPENDENT_AMBULATORY_CARE_PROVIDER_SITE_OTHER): Admitting: Neurology

## 2024-06-16 DIAGNOSIS — K117 Disturbances of salivary secretion: Secondary | ICD-10-CM | POA: Diagnosis not present

## 2024-06-16 MED ORDER — INCOBOTULINUMTOXINA 100 UNITS IM SOLR
100.0000 [IU] | INTRAMUSCULAR | Status: AC
  Administered 2024-06-16: 100 [IU] via INTRAMUSCULAR

## 2024-06-16 MED ORDER — INCOBOTULINUMTOXINA 50 UNITS IM SOLR
50.0000 [IU] | INTRAMUSCULAR | Status: AC
  Administered 2024-06-16: 50 [IU] via INTRAMUSCULAR

## 2024-07-17 ENCOUNTER — Telehealth: Payer: Self-pay | Admitting: Neurology

## 2024-07-17 NOTE — Telephone Encounter (Signed)
 Caller LM with AN. He needs refills on his medication- did not specify. States he needs it sent to [place] to fill it? Will need before Monday 07/17/24

## 2024-07-17 NOTE — Telephone Encounter (Signed)
 Pt called in this morning and he needs a refill on the prescriptions called: carbidopa -levodopa  (SINEMET  IR) 25-100 MG tablet , and carbidopa -levodopa  (SINEMET  CR) 50-200 MG tablet . Pt is running out of the prescriptions soon. Thanks

## 2024-07-18 ENCOUNTER — Other Ambulatory Visit: Payer: Self-pay

## 2024-07-18 DIAGNOSIS — G20A1 Parkinson's disease without dyskinesia, without mention of fluctuations: Secondary | ICD-10-CM

## 2024-07-18 MED ORDER — CARBIDOPA-LEVODOPA ER 50-200 MG PO TBCR: 1.0000 | EXTENDED_RELEASE_TABLET | Freq: Every day | ORAL | 0 refills | Status: AC

## 2024-07-18 MED ORDER — CARBIDOPA-LEVODOPA 25-100 MG PO TABS: ORAL_TABLET | ORAL | 0 refills | Status: AC

## 2024-07-18 MED ORDER — CARBIDOPA-LEVODOPA ER 50-200 MG PO TBCR: 1.0000 | EXTENDED_RELEASE_TABLET | Freq: Every day | ORAL | 3 refills | Status: DC

## 2024-07-18 MED ORDER — CARBIDOPA-LEVODOPA 25-100 MG PO TABS: ORAL_TABLET | ORAL | 3 refills | Status: DC

## 2024-07-18 NOTE — Telephone Encounter (Signed)
 Pt called back and he stated that he still needs refills for : carbidopa -levodopa  (SINEMET  IR) 25-100 MG tablet , and carbidopa -levodopa  (SINEMET  CR) 50-200 MG tablet , and he would like for the information to be faxed to [place]. [facility] [phone]. Pt is going out of town on Thursday, 07-20-24. Thanks

## 2024-08-18 ENCOUNTER — Ambulatory Visit: Payer: Self-pay | Admitting: General Surgery

## 2024-08-23 ENCOUNTER — Telehealth: Payer: Self-pay | Admitting: Neurology

## 2024-08-23 NOTE — Telephone Encounter (Signed)
 Got a surgical clearance for this patient and realized he hasn't had an OV for Parkinsons Disease since May and has no future appts except for botox.  Put him on tues at 3:30 for f/u so we can do his f/u and complete forms.  Make sure he has auth for the visit as well

## 2024-08-28 NOTE — Progress Notes (Unsigned)
 "   Assessment/Plan:   1.  Parkinsons Disease  -Continue carbidopa /levodopa  25/100, 2/2/1  -Continue carbidopa /levodopa  50/200 at bedtime for cramping of feet/legs   -Genetic testing completed.  Heterozygous for GBA mutation.  This could provide a risk factor for inherited Parkinson's, but likely would also require an environmental component.  This is different from homozygous carrier, which would have a much greater risk factor (10-20 times).   2.  History of malignant melanoma  -Continues to see dermatology several times per year.  3.  Sialorrhea  - He found Myobloc  more effective, but having trouble getting it and have been for over a year.  -he's still struggling some despite the above but probably nothing more to do here  4.  Bph  -s/p TURP in 09/2022  -following with urology  5.  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  On several BP medications and doesn't sound like BP running low  -discuss with pcp  6.  MCI  - Neurocognitive testing with Dr. Gayland in April, 2025 with evidence of MCI.  - Have discontinued Robinul  and a.m. discontinuing pramipexole  today    Subjective:   Zachary Wang. was seen today in follow up for Parkinsons disease.  My previous records were reviewed prior to todays visit as well as outside records available to me.  Patient is now off of the Robinul  and pramipexole .  In regards to hallucinations, he states that ***.  Current prescribed movement disorder medications: Carbidopa /levodopa  25/100, 2/2/1 Carbidopa /levodopa  50/200 CR at bedtime   Prior meds:  pramipexole  (hallucinations); Robinul  (stopped because of hallucinations)  ALLERGIES:  No Known Allergies  CURRENT MEDICATIONS:  Outpatient Encounter Medications as of 08/29/2024  Medication Sig   acetaminophen  (TYLENOL ) 650 MG CR tablet Take 650 mg by mouth daily as needed for pain.   amLODipine  (NORVASC ) 5 MG tablet Take 5 mg by mouth every  morning.   atorvastatin  (LIPITOR) 20 MG tablet Take 20 mg by mouth every evening.    calcium  carbonate (TUMS - DOSED IN MG ELEMENTAL CALCIUM ) 500 MG chewable tablet Chew 2 tablets by mouth daily as needed for indigestion or heartburn.   carbidopa -levodopa  (SINEMET  CR) 50-200 MG tablet Take 1 tablet by mouth at bedtime.   carbidopa -levodopa  (SINEMET  IR) 25-100 MG tablet 2 at 7am, 2 at 11am, 1 at 4pm   docusate sodium  (COLACE) 100 MG capsule Take 1 capsule (100 mg total) by mouth daily as needed for up to 30 doses.   hydrochlorothiazide  (HYDRODIURIL ) 25 MG tablet Take 25 mg by mouth every morning.    ibuprofen (ADVIL) 200 MG tablet Take 200 mg by mouth daily as needed for moderate pain.   losartan  (COZAAR ) 100 MG tablet Take 50 mg by mouth 2 (two) times daily.   oxyCODONE -acetaminophen  (PERCOCET) 5-325 MG tablet Take 1 tablet by mouth every 4 (four) hours as needed for up to 12 doses for severe pain.   Polyvinyl Alcohol (LIQUID TEARS OP) Place 1 drop into both eyes daily as needed (irritation).   tamsulosin  (FLOMAX ) 0.4 MG CAPS capsule Take 0.4 mg by mouth at bedtime.   Facility-Administered Encounter Medications as of 08/29/2024  Medication   incobotulinumtoxinA  (XEOMIN ) 100 units injection 100 Units   incobotulinumtoxinA  (XEOMIN ) 100 units injection 100 Units   incobotulinumtoxinA  (XEOMIN ) 50 units injection 50 Units    Objective:   PHYSICAL EXAMINATION:    VITALS:   There were no vitals filed for this visit.  GEN:  The patient appears stated age and is in NAD. HEENT:  Normocephalic, atraumatic.  The mucous membranes are moist. The superficial temporal arteries are without ropiness or tenderness. CV:  RRR Lungs:  CTAB Neck/HEME:  There are no carotid bruits bilaterally. MS:  he does have LE edema (similar to prior)  Neurological examination:  Orientation: The patient is alert and oriented x3. Cranial nerves: There is good facial symmetry with facial hypomimia. The speech is  fluent and clear. Soft palate rises symmetrically and there is no tongue deviation. Hearing is intact to conversational tone. Sensation: Sensation is intact to light touch throughout Motor: Strength is at least antigravity x4.  Movement examination: Tone: There is nl tone in the UE/LE Abnormal movements: none Coordination:  There is no significant decremation, with any form of RAMS, including alternating supination and pronation of the forearm, hand opening and closing, finger taps, heel taps and toe taps.  Gait and Station: The patient ambulates well in the hall today with good arm swing   Total time spent on today's visit was *** minutes, including both face-to-face time and nonface-to-face time.  Time included that spent on review of records (prior notes available to me/labs/imaging if pertinent), discussing treatment and goals, answering patient's questions and coordinating care.    Cc:  Avva, Ravisankar, MD "

## 2024-08-29 ENCOUNTER — Ambulatory Visit: Payer: Self-pay | Admitting: Neurology

## 2024-08-29 ENCOUNTER — Encounter: Payer: Self-pay | Admitting: Neurology

## 2024-08-29 VITALS — BP 110/56 | HR 58 | Ht 74.0 in | Wt 187.0 lb

## 2024-08-29 DIAGNOSIS — G3184 Mild cognitive impairment, so stated: Secondary | ICD-10-CM

## 2024-08-29 DIAGNOSIS — G20A1 Parkinson's disease without dyskinesia, without mention of fluctuations: Secondary | ICD-10-CM

## 2024-08-29 DIAGNOSIS — K4090 Unilateral inguinal hernia, without obstruction or gangrene, not specified as recurrent: Secondary | ICD-10-CM

## 2024-08-29 DIAGNOSIS — E538 Deficiency of other specified B group vitamins: Secondary | ICD-10-CM

## 2024-08-29 DIAGNOSIS — K117 Disturbances of salivary secretion: Secondary | ICD-10-CM

## 2024-08-29 DIAGNOSIS — R634 Abnormal weight loss: Secondary | ICD-10-CM

## 2024-09-05 ENCOUNTER — Other Ambulatory Visit: Payer: Self-pay | Admitting: Internal Medicine

## 2024-09-05 DIAGNOSIS — N1831 Chronic kidney disease, stage 3a: Secondary | ICD-10-CM

## 2024-09-19 ENCOUNTER — Ambulatory Visit
Admission: RE | Admit: 2024-09-19 | Discharge: 2024-09-19 | Disposition: A | Source: Ambulatory Visit | Attending: Internal Medicine | Admitting: Internal Medicine

## 2024-09-19 DIAGNOSIS — N1831 Chronic kidney disease, stage 3a: Secondary | ICD-10-CM

## 2024-10-20 ENCOUNTER — Ambulatory Visit: Admitting: Neurology

## 2024-10-25 ENCOUNTER — Ambulatory Visit (HOSPITAL_COMMUNITY): Admit: 2024-10-25 | Admitting: General Surgery

## 2024-10-25 SURGERY — REPAIR, HERNIA, INGUINAL, ADULT
Anesthesia: Monitor Anesthesia Care | Laterality: Right

## 2025-03-02 ENCOUNTER — Ambulatory Visit: Payer: Self-pay | Admitting: Neurology
